# Patient Record
Sex: Female | Born: 1945 | Race: White | Hispanic: No | Smoking: Former smoker
Health system: Southern US, Community
[De-identification: ages and names within clinical notes are randomized; demographics above are authoritative.]

## PROBLEM LIST (undated history)

## (undated) DIAGNOSIS — C4490 Unspecified malignant neoplasm of skin, unspecified: Secondary | ICD-10-CM

## (undated) DIAGNOSIS — R42 Dizziness and giddiness: Secondary | ICD-10-CM

## (undated) DIAGNOSIS — M419 Scoliosis, unspecified: Secondary | ICD-10-CM

## (undated) DIAGNOSIS — K219 Gastro-esophageal reflux disease without esophagitis: Secondary | ICD-10-CM

## (undated) DIAGNOSIS — I509 Heart failure, unspecified: Secondary | ICD-10-CM

## (undated) DIAGNOSIS — E039 Hypothyroidism, unspecified: Secondary | ICD-10-CM

## (undated) DIAGNOSIS — J45909 Unspecified asthma, uncomplicated: Secondary | ICD-10-CM

## (undated) DIAGNOSIS — I10 Essential (primary) hypertension: Secondary | ICD-10-CM

## (undated) DIAGNOSIS — H409 Unspecified glaucoma: Secondary | ICD-10-CM

## (undated) DIAGNOSIS — J302 Other seasonal allergic rhinitis: Secondary | ICD-10-CM

## (undated) DIAGNOSIS — I2510 Atherosclerotic heart disease of native coronary artery without angina pectoris: Secondary | ICD-10-CM

## (undated) HISTORY — PX: ORIF TIBIA & FIBULA FRACTURES: SHX2131

## (undated) HISTORY — PX: TONSILLECTOMY: SUR1361

## (undated) HISTORY — DX: Atherosclerotic heart disease of native coronary artery without angina pectoris: I25.10

## (undated) HISTORY — DX: Heart failure, unspecified: I50.9

## (undated) HISTORY — PX: COLONOSCOPY: SHX174

---

## 2009-06-17 ENCOUNTER — Ambulatory Visit: Payer: Self-pay | Admitting: Internal Medicine

## 2009-06-17 DIAGNOSIS — J45909 Unspecified asthma, uncomplicated: Secondary | ICD-10-CM | POA: Insufficient documentation

## 2009-06-17 DIAGNOSIS — M4120 Other idiopathic scoliosis, site unspecified: Secondary | ICD-10-CM | POA: Insufficient documentation

## 2009-06-17 DIAGNOSIS — R059 Cough, unspecified: Secondary | ICD-10-CM | POA: Insufficient documentation

## 2009-06-17 DIAGNOSIS — R05 Cough: Secondary | ICD-10-CM

## 2010-05-31 NOTE — Assessment & Plan Note (Signed)
Summary: Pulmonary consultation/ upper airway cough syndrome   Visit Type:  Initial Consult Copy to:  Dr. Gertie Baron  Primary Provider/Referring Provider:  Dr. Jeanie Cooks  CC:  Asthma.  History of Present Illness: 65 yowf quit smoking quit 1997 with intermittent symptoms of sinus and bronchitis but not needing any kind of maintenance except allergy shots quit in 1990's and they didn't really help much.   June 17, 2009 cc "never stop drainage" since onset of cold fall of 2010 and ENT eval neg with no cough but sensation of wheeziness in chest but treated 3 weeks ago sulfa drug x one week helped some then prednsione x one week then augmentin x  finished 10 days on  2/16  but  2/14  started coughing up yellow green in am says biaxin works the best.    Pt denies any significant sore throat, dysphagia, itching, sneezing,   fever, chills, sweats, unintended wt loss, pleuritic or exertional cp, hempoptysis, change in activity tolerance  orthopnea pnd or leg swelling. Pt also denies any obvious fluctuation in symptoms with weather or environmental change or other alleviating or aggravating factors.       Current Medications (verified): 1)  Levothroid 75 Mcg Tabs (Levothyroxine Sodium) .Marland Kitchen.. 1 Once Daily 2)  Triamterene-Hctz 37.5-25 Mg Tabs (Triamterene-Hctz) .Marland Kitchen.. 1 Once Daily 3)  Singulair 10 Mg Tabs (Montelukast Sodium) .Marland Kitchen.. 1 Once Daily 4)  Actonel 150 Mg Tabs (Risedronate Sodium) .Marland Kitchen.. 1 Every Monday 5)  Claritin 10 Mg Tabs (Loratadine) .Marland Kitchen.. 1 Once Daily As Needed 6)  Tavist Allergy 1.34 Mg Tabs (Clemastine Fumarate) .Marland Kitchen.. 1 Once Daily As Needed If Not Taking Clariton 7)  Coenzyme Q10 100 Mg Caps (Coenzyme Q10) .Marland Kitchen.. 1 Once Daily 8)  Simvastatin 10 Mg Tabs (Simvastatin) .Marland Kitchen.. 1 Once Daily 9)  Vitamin B-12 100 Mcg Tabs (Cyanocobalamin) .Marland Kitchen.. 1 Once Daily  Allergies (verified): 1)  ! Tetracycline  Past History:  Past Medical History: SCOLIOSIS (ICD-737.30) ASTHMA (ICD-493.90)  Past  Surgical History: Tonsillectomy 1966  Family History: Asthma- maternal aunt  Social History: Married Former smoker.  Quit in 1997.  Smoked for approx 30 yrs up to 1/2 ppd ETOH occ Retired Lives in [place] Oct - April every year [address] but not 2010  Review of Systems       The patient complains of shortness of breath with activity, shortness of breath at rest, productive cough, indigestion, anxiety, and change in color of mucus.  The patient denies non-productive cough, coughing up blood, chest pain, irregular heartbeats, acid heartburn, loss of appetite, weight change, abdominal pain, difficulty swallowing, sore throat, tooth/dental problems, headaches, nasal congestion/difficulty breathing through nose, sneezing, itching, ear ache, depression, hand/feet swelling, joint stiffness or pain, rash, and fever.    Vital Signs:  Patient profile:   65 year old female Height:      63 inches Weight:      182.38 pounds BMI:     32.42 O2 Sat:      96 % on Room air Temp:     97.6 degrees F oral Pulse rate:   77 / minute BP sitting:   138 / 78  (left arm)  Vitals Entered By: Vernie Murders (June 17, 2009 10:20 AM)  O2 Flow:  Room air  Physical Exam  Additional Exam:  amb mod anxious wf who failed to answer a single question asked in a straightforward manner, tending to go off on tangents or answer questions with ambiguous medical terms or diagnoses  wt 182  June 17, 2009  HEENT: nl dentition, turbinates, and orophanx. Nl external ear canals without cough reflex NECK :  without JVD/Nodes/TM/ nl carotid upstrokes bilaterally LUNGS: no acc muscle use, clear to A and P bilaterally without cough on insp or exp maneuvers CV:  RRR  no s3 or murmur or increase in P2, no edema  ABD:  soft and nontender with nl excursion in the supine position. No bruits or organomegaly, bowel sounds nl MS:  warm without deformities, calf tenderness, cyanosis or clubbing SKIN: warm and dry without lesions    NEURO:  alert, approp, no deficits     CXR  Procedure date:  06/17/2009  Findings:        Findings: There is a severe right convex thoracic scoliotic curvature with associated tortuosity of the thoracic aorta.  The heart is normal in size.  No acute pulmonary findings.  Minimal basilar scarring or atelectasis.   IMPRESSION:   1.  Streaky bibasilar scarring or atelectasis. 2.  No infiltrates, edema or effusions  Impression & Recommendations:  Problem # 1:  COUGH (ICD-786.2) The most common causes of chronic cough in immunocompetent adults include: upper airway cough syndrome (UACS), previously referred to as postnasal drip syndrome,  caused by variety of rhinosinus conditions; (2) asthma; (3) GERD; (4) chronic bronchitis from cigarette smoking or other inhaled environmental irritants; (5) nonasthmatic eosinophilic bronchitis; and (6) bronchiectasis. These conditions, singly or in combination, have accounted for up to 94% of the causes of chronic cough in prospective studies.  Most likely this is Upper airway cough syndrome, so named because it's frequently impossible to sort out how much is LPR vs CR/sinusitis with freq throat clearing generating secondary extra esophageal GERD from wide swings in gastric pressure that occur with throat clearing, promoting self use of mint and menthol lozenges that reduce the lower esophageal sphincter tone and exacerbate the problem further.  These symptoms are easily confused with asthma/copd by even experienced pulmonogists because they overlap so much. These are the same pts who not infrequently have failed to tolerate ace inhibitors,  dry powder inhalers or biphosphonates or report having reflux symptoms that don't respond to standard doses of PPI  The standardized cough guidelines recently published in Chest are a 14 step process, not a single office visit,  and are intended  to address this problem logically,  with an alogrithm dependent on  response to each progressive step  to determine a specific diagnosis with  minimal addtional testing needed. Therefore if compliance is an issue this empiric standardized approach simply won't work.   See instructions for specific recommendations   Problem # 2:  ASTHMA (ICD-493.90) Strongly doubt this is asthma though remains in DDX and is easily confused with the upper airway cough syndrome.  Would consider methacholine challenge test later after first address the upper airway aggressively as above.  Medications Added to Medication List This Visit: 1)  Levothroid 75 Mcg Tabs (Levothyroxine sodium) .Marland Kitchen.. 1 once daily 2)  Triamterene-hctz 37.5-25 Mg Tabs (Triamterene-hctz) .Marland Kitchen.. 1 once daily 3)  Singulair 10 Mg Tabs (Montelukast sodium) .Marland Kitchen.. 1 once daily 4)  Actonel 150 Mg Tabs (Risedronate sodium) .Marland Kitchen.. 1 every monday 5)  Claritin 10 Mg Tabs (Loratadine) .Marland Kitchen.. 1 once daily as needed 6)  Tavist Allergy 1.34 Mg Tabs (Clemastine fumarate) .Marland Kitchen.. 1 once daily as needed if not taking clariton 7)  Coenzyme Q10 100 Mg Caps (Coenzyme q10) .Marland Kitchen.. 1 once daily 8)  Simvastatin 10 Mg Tabs (Simvastatin) .Marland Kitchen.. 1 once  daily 9)  Vitamin B-12 100 Mcg Tabs (Cyanocobalamin) .Marland Kitchen.. 1 once daily 10)  Biaxin Xl 500 Mg Tb24 (Clarithromycin) .Marland Kitchen.. 1000 mg (2 tablets) daily x 10days  Other Orders: T-2 View CXR ([id number]) Consultation Level V (78295)  Patient Instructions: 1)  Acid reflux is a leading suspect here and needs to be eliminated  completely before considering additional studies or treatment options. To suppress this maximally, take prilosec 20 mg 30 min  before  and pepcid 20 mg (otc) at bedtime plus diet measures as listed until 100% better 2)  GERD (REFLUX)  is a common cause of respiratory symptoms. It commonly presents without heartburn and can be treated with medication, but also with lifestyle changes including avoidance of late meals, excessive alcohol, smoking cessation, and avoid fatty foods, chocolate,  peppermint, colas, red wine, and acidic juices such as orange juice. NO MINT OR MENTHOL PRODUCTS SO NO COUGH DROPS  3)  USE SUGARLESS CANDY INSTEAD (jolley ranchers)  4)  NO OIL BASED VITAMINS  5)  NO ACTONEL until 100% better  6)  Biaxin 500 take 2 daily x 10 days  7)  Pulmonary f/u can be as needed if not 100% better. Prescriptions: BIAXIN XL 500 MG  TB24 (CLARITHROMYCIN) 1000 mg (2 tablets) daily x 10days Brand medically necessary #20 x 0   Entered and Authorized by:   Nyoka Cowden MD   Signed by:   Nyoka Cowden MD on 06/17/2009   Method used:   Electronically to        [facility] [phone]* (retail)       [address]       [place], [place]  [address]       Ph: [phone]       Fax:    RxID:   [id number]

## 2012-09-20 ENCOUNTER — Ambulatory Visit: Payer: Self-pay | Admitting: Family Medicine

## 2013-09-14 ENCOUNTER — Ambulatory Visit: Payer: Self-pay | Admitting: Physician Assistant

## 2013-09-23 ENCOUNTER — Ambulatory Visit: Payer: Self-pay | Admitting: Family Medicine

## 2014-07-16 IMAGING — CR DG KNEE COMPLETE 4+V*L*
1 series · 5 of 5 positions shown · non-contrast
Comparison: None.

CLINICAL DATA: Fall

EXAM:
LEFT KNEE - COMPLETE 4+ VIEW

[Series 1: ap · 0.17mm/px · 5 of 5 slices shown]
[im 1/5]
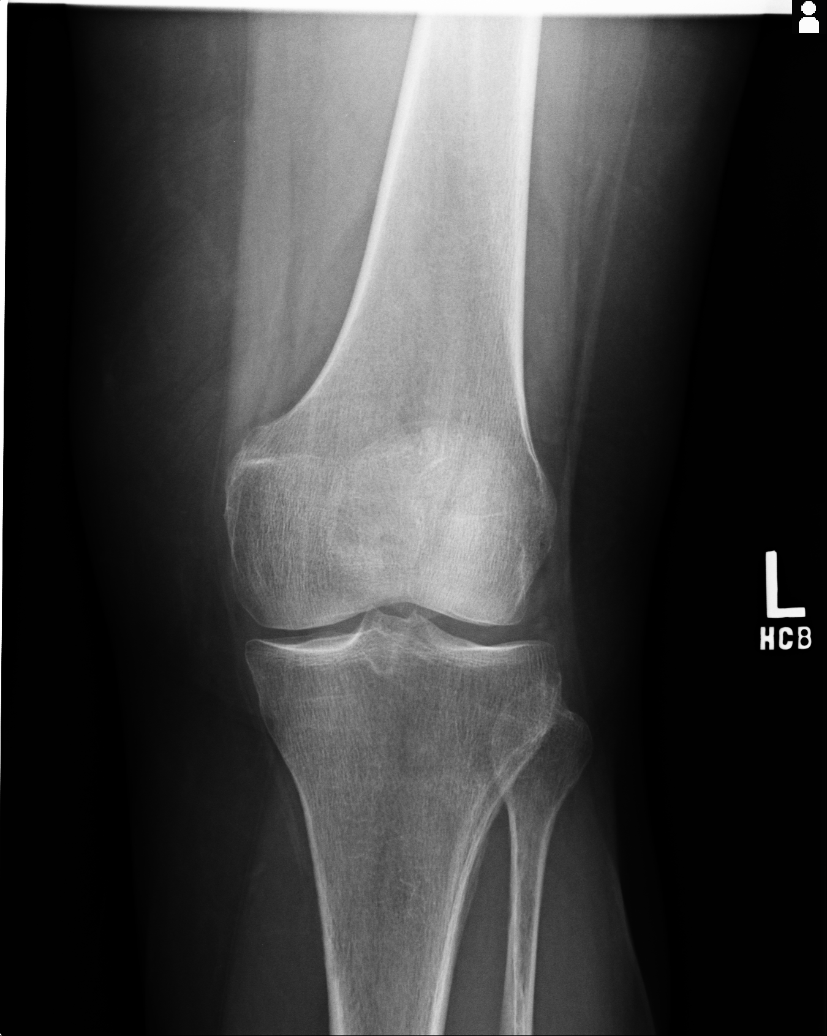
[im 2/5]
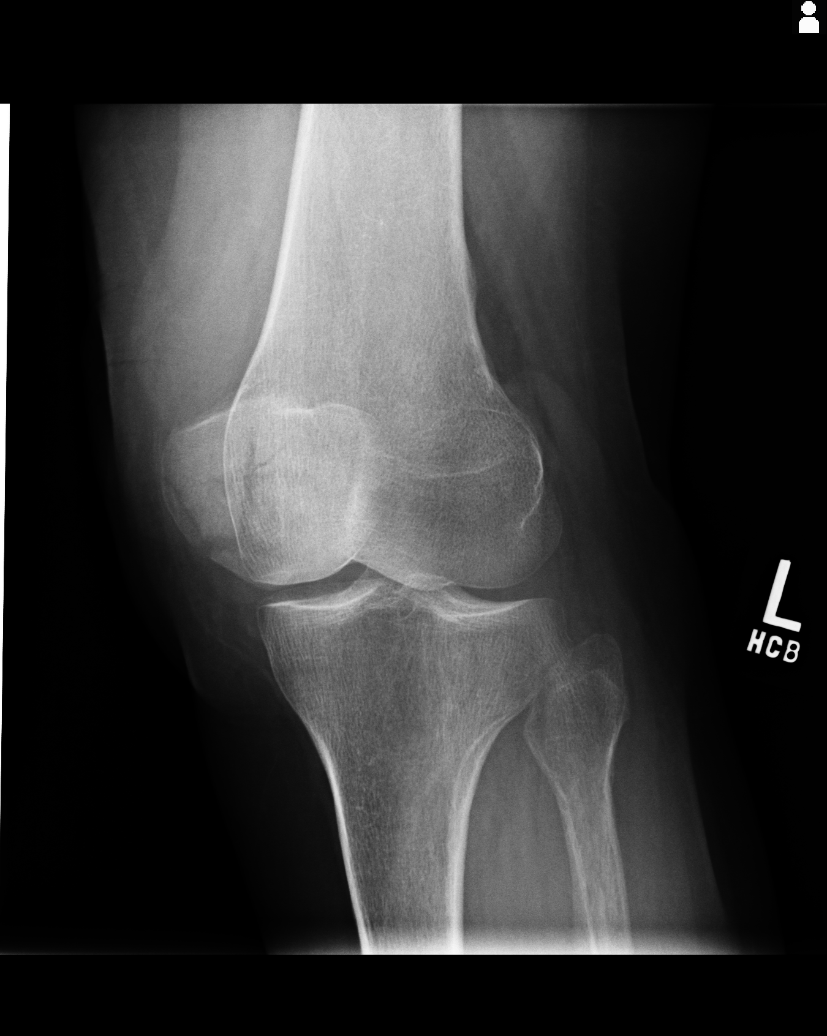
[im 3/5]
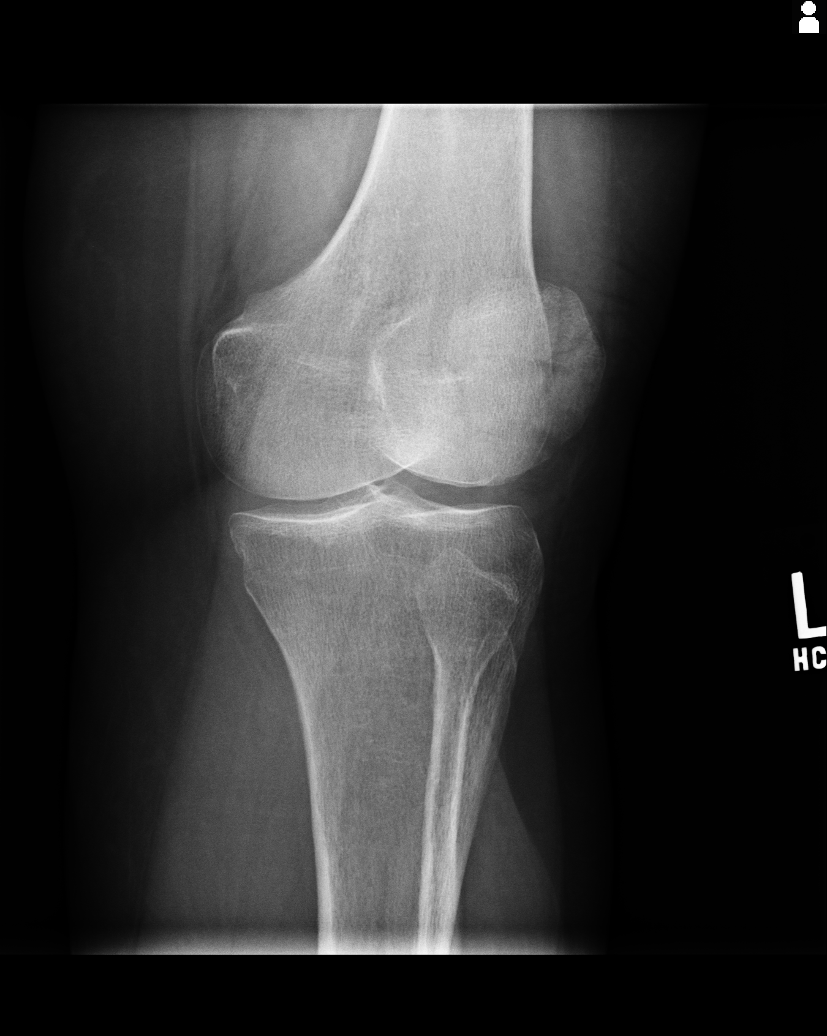
[im 4/5]
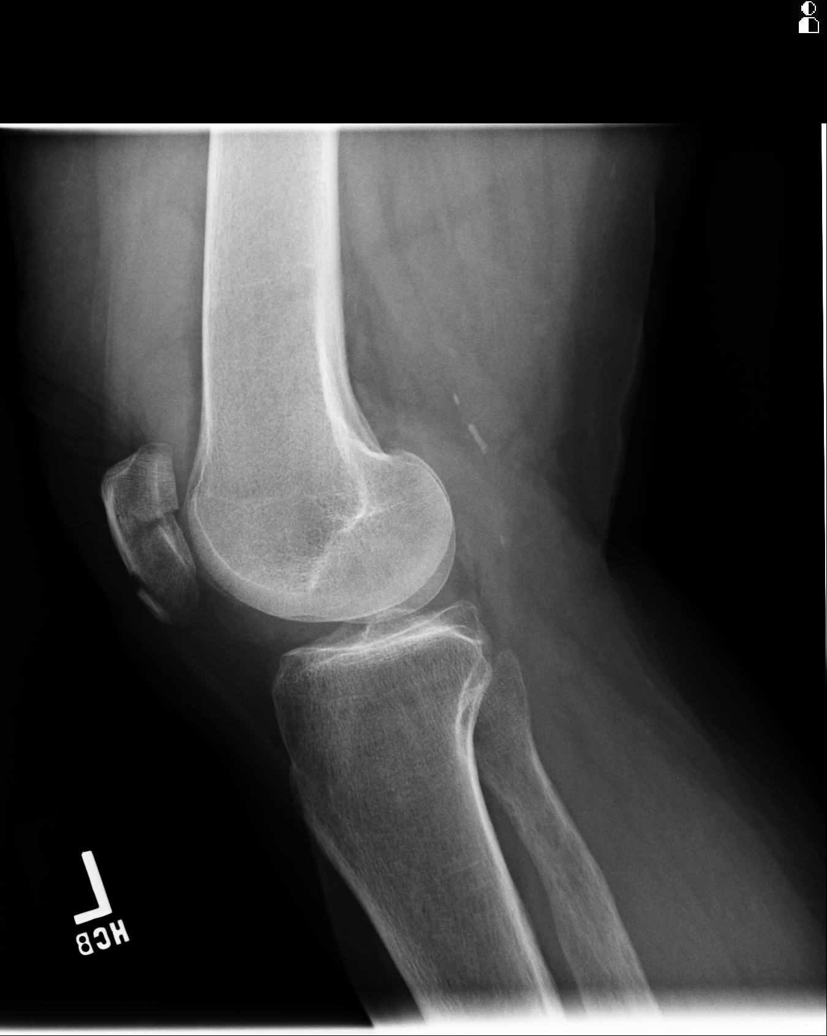
[im 5/5]
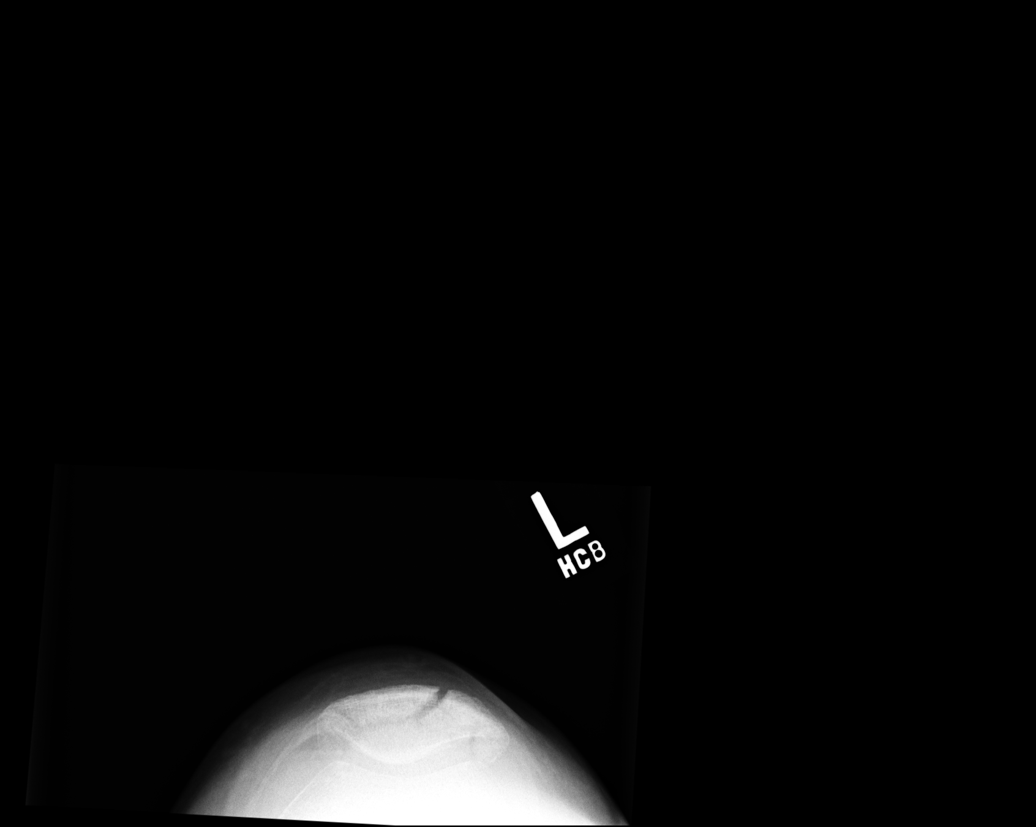

[5 of 5 positions shown; findings below may reference images not displayed]

FINDINGS: Mild narrowing of the medial compartment. Mild osteopenia. There is
a comminuted fracture of the patella. Moderate joint effusion is
present likely a hemarthrosis. Vascular calcifications.
IMPRESSION: Comminuted fracture of the patella associated with a hemarthrosis.

## 2014-08-06 ENCOUNTER — Ambulatory Visit
Admit: 2014-08-06 | Disposition: A | Discharge status: Home / Self Care | Payer: Self-pay | Attending: Family Medicine | Admitting: Family Medicine

## 2015-09-29 IMAGING — US US CAROTID DUPLEX BILAT
1 series · 13 of 24 positions shown · non-contrast
Comparison: None.

CLINICAL DATA: Calcifications seen on dental radiographs, history
of hypertension, hyperlipidemia

EXAM:
BILATERAL CAROTID DUPLEX ULTRASOUND
TECHNIQUE: Gray scale imaging, color Doppler and duplex ultrasound were
performed of bilateral carotid and vertebral arteries in the neck.

[Series 1: us carotid duplex bilat · 0.05mm/px · 13 of 63 slices shown]
[im 1/63]
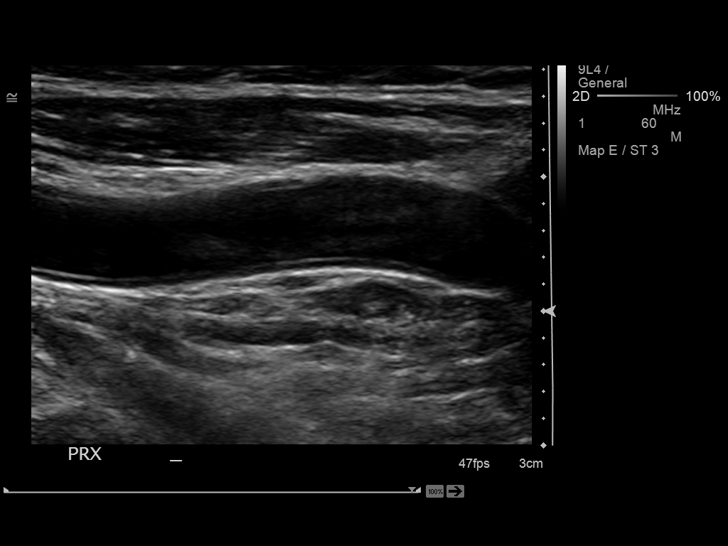
[im 6/63]
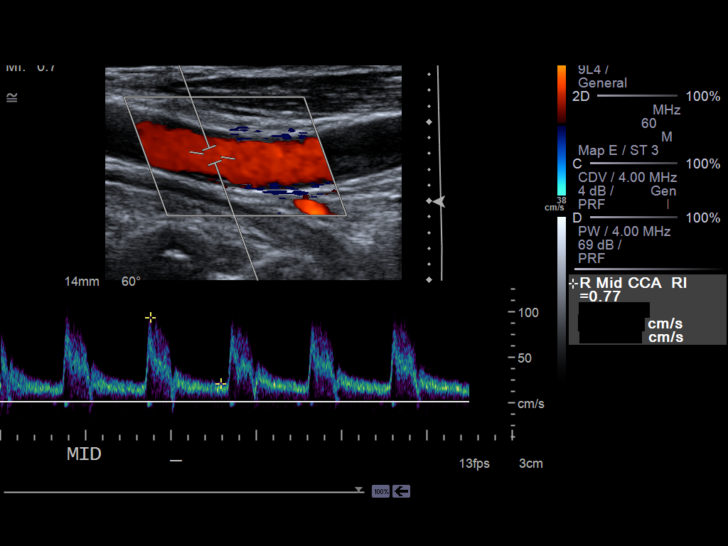
[im 11/63]
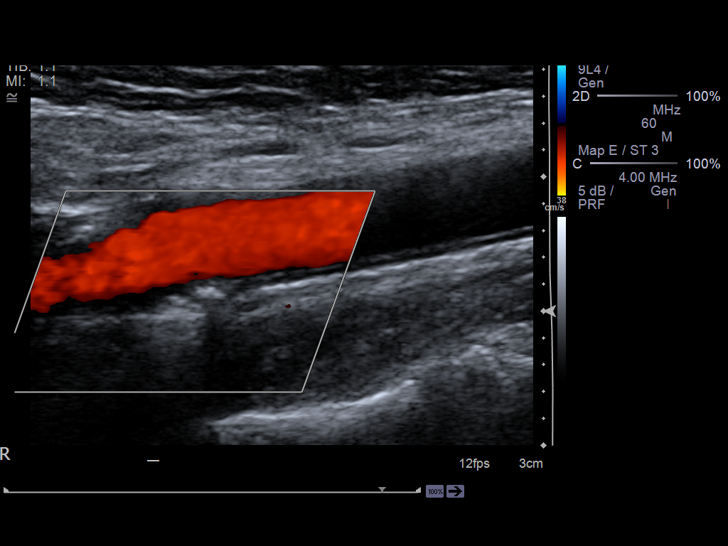
[im 17/63]
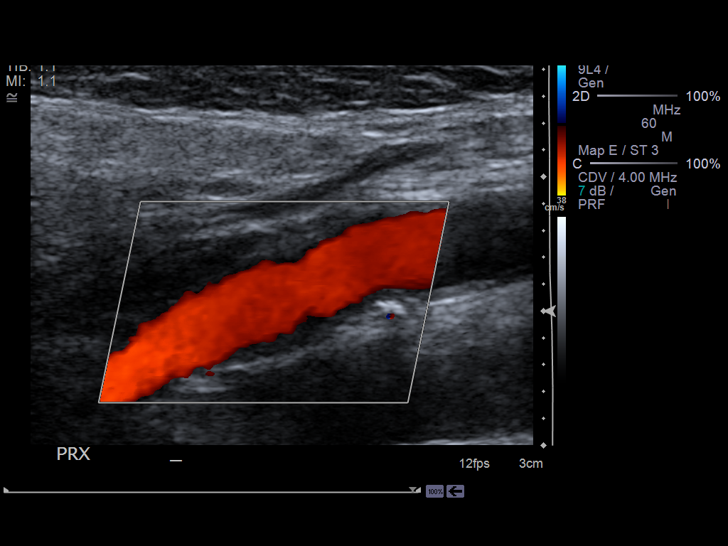
[im 22/63]
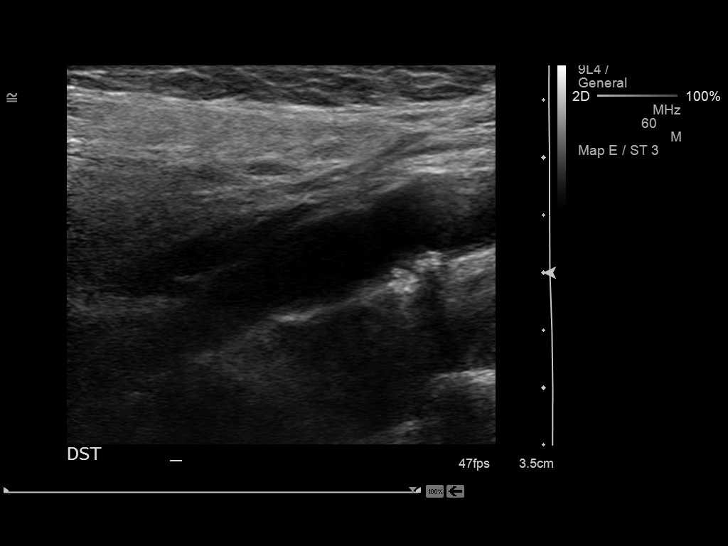
[im 27/63]
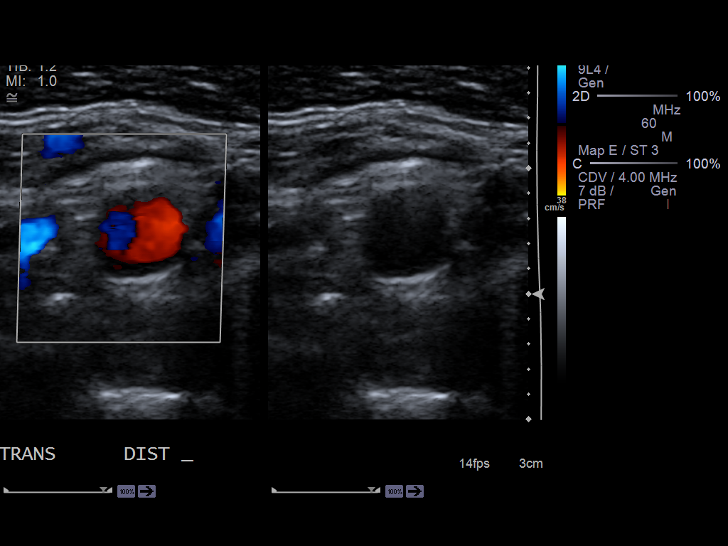
[im 33/63]
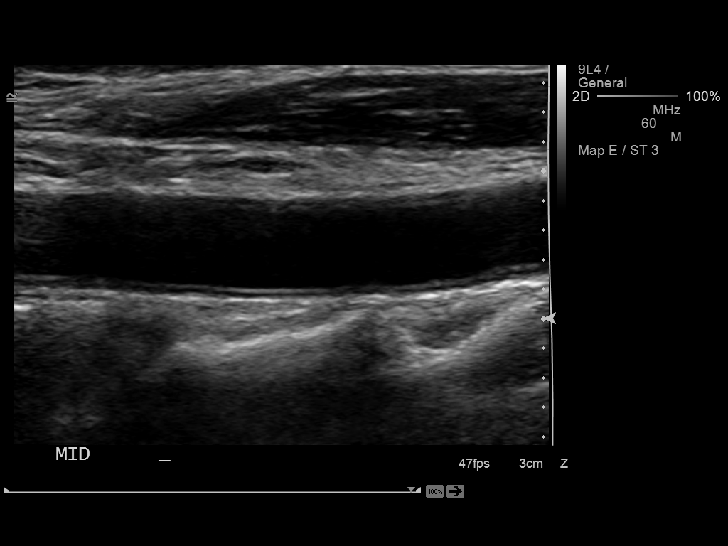
[im 36/63]
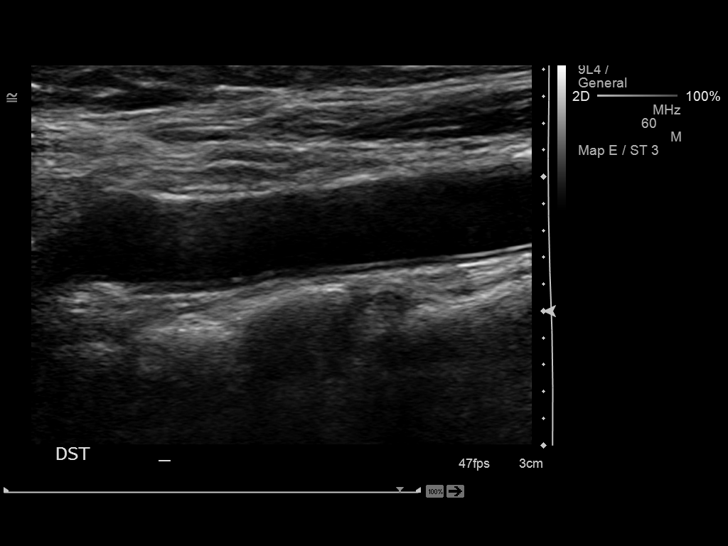
[im 41/63]
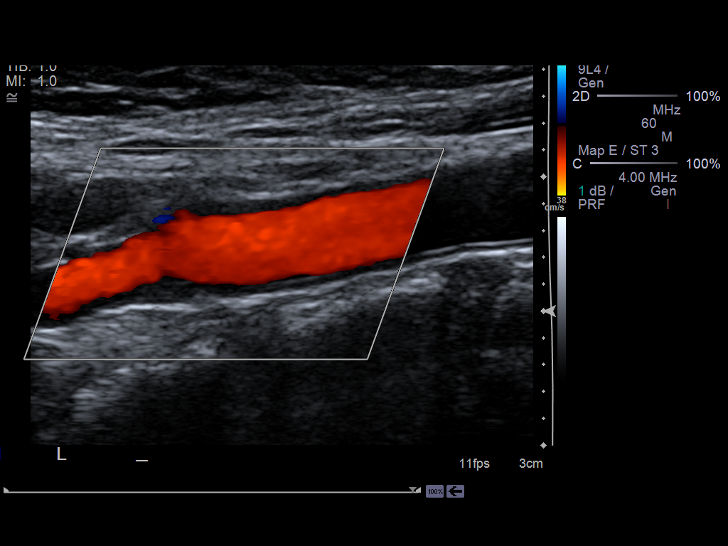
[im 46/63]
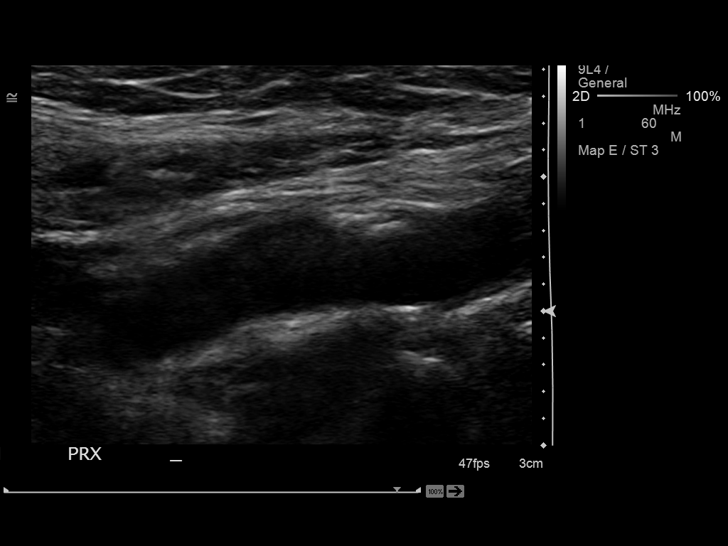
[im 52/63]
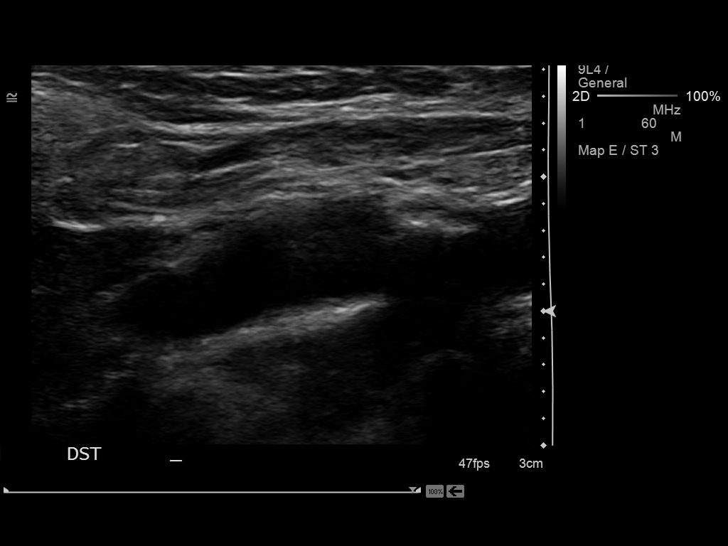
[im 57/63]
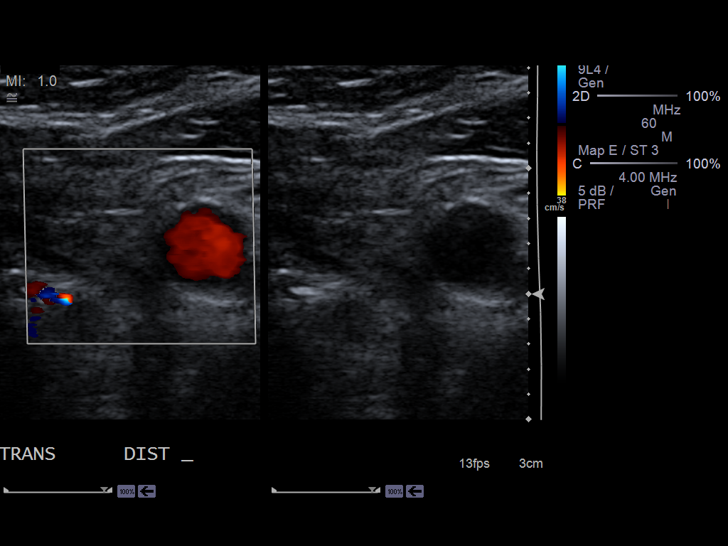
[im 63/63]
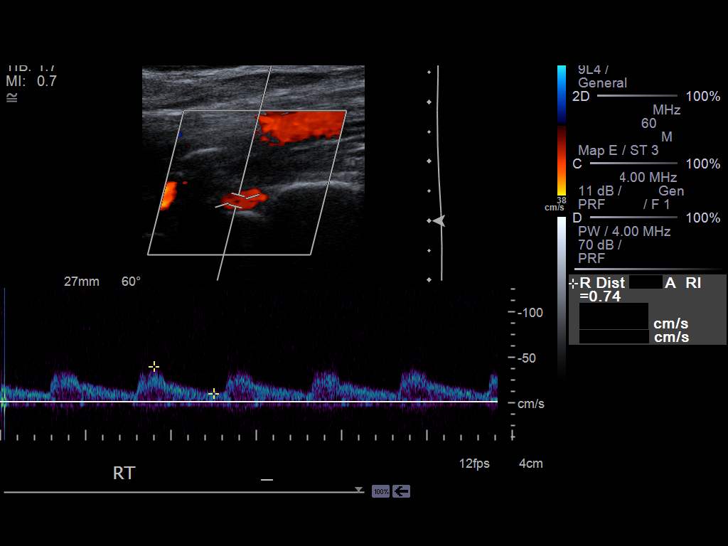

[13 of 24 positions shown; findings below may reference images not displayed]

FINDINGS: Criteria: Quantification of carotid stenosis is based on velocity
parameters that correlate the residual internal carotid diameter
with NASCET-based stenosis levels, using the diameter of the distal
internal carotid lumen as the denominator for stenosis measurement.

The following velocity measurements were obtained:

RIGHT

ICA:  82/34.4 cm/sec

CCA:  93.7/21.1 cm/sec

SYSTOLIC ICA/CCA RATIO:

DIASTOLIC ICA/CCA RATIO:

ECA:  103.1 cm/sec

LEFT

ICA:  76.5/25 cm/sec

CCA:  80.8/20.2 cm/sec

SYSTOLIC ICA/CCA RATIO:

DIASTOLIC ICA/CCA RATIO:

ECA:  92.2 cm/sec

RIGHT CAROTID ARTERY: There is a minimal amount of eccentric mixed
echogenic plaque within the right carotid bulb (image 11) extending
to involve the origin proximal aspect of the right internal carotid
artery (image 17), not resulting in elevated peak systolic
velocities within the interrogated course of the right internal
carotid artery to suggest a hemodynamically significant stenosis.

RIGHT VERTEBRAL ARTERY:  Antegrade flow

LEFT CAROTID ARTERY: There is a minimal amount of eccentric
hypoechoic plaque within the left carotid bulb (image 41) extending
to involve the origin proximal aspect of the left internal carotid
artery (image 47), not resulting in elevated peak systolic
velocities within the interrogated course of the left internal
carotid artery to suggest a hemodynamically significant stenosis.

LEFT VERTEBRAL ARTERY:  Antegrade flow
IMPRESSION: Minimal amount of bilateral atherosclerotic plaque, right
subjectively greater than left, not resulting in a hemodynamically
significant stenosis.

## 2016-10-09 ENCOUNTER — Ambulatory Visit: Admit: 2016-10-09 | Payer: Self-pay | Admitting: Unknown Physician Specialty

## 2016-10-09 SURGERY — COLONOSCOPY WITH PROPOFOL
Anesthesia: General

## 2018-02-26 ENCOUNTER — Encounter: Payer: Self-pay | Admitting: Family Medicine

## 2018-02-27 ENCOUNTER — Other Ambulatory Visit: Payer: Self-pay

## 2018-02-27 DIAGNOSIS — Z86010 Personal history of colonic polyps: Secondary | ICD-10-CM

## 2018-03-06 ENCOUNTER — Encounter: Payer: Self-pay | Admitting: *Deleted

## 2018-03-06 ENCOUNTER — Other Ambulatory Visit: Payer: Self-pay

## 2018-03-06 MED ORDER — NA SULFATE-K SULFATE-MG SULF 17.5-3.13-1.6 GM/177ML PO SOLN: 1.0000 | Freq: Once | ORAL | 0 refills | Status: AC

## 2018-03-12 ENCOUNTER — Ambulatory Visit: Payer: Medicare Other | Admitting: Anesthesiology

## 2018-03-12 ENCOUNTER — Ambulatory Visit
Admission: RE | Admit: 2018-03-12 | Discharge: 2018-03-12 | Disposition: A | Discharge status: Home / Self Care | Payer: Medicare Other | Source: Ambulatory Visit | Attending: Gastroenterology | Admitting: Gastroenterology

## 2018-03-12 ENCOUNTER — Encounter
Admission: RE | Disposition: A | Discharge status: Home / Self Care | Payer: Self-pay | Source: Ambulatory Visit | Attending: Gastroenterology

## 2018-03-12 DIAGNOSIS — I10 Essential (primary) hypertension: Secondary | ICD-10-CM | POA: Insufficient documentation

## 2018-03-12 DIAGNOSIS — Z7951 Long term (current) use of inhaled steroids: Secondary | ICD-10-CM | POA: Insufficient documentation

## 2018-03-12 DIAGNOSIS — Z86010 Personal history of colonic polyps: Secondary | ICD-10-CM

## 2018-03-12 DIAGNOSIS — Z79899 Other long term (current) drug therapy: Secondary | ICD-10-CM | POA: Diagnosis not present

## 2018-03-12 DIAGNOSIS — K5730 Diverticulosis of large intestine without perforation or abscess without bleeding: Secondary | ICD-10-CM | POA: Diagnosis not present

## 2018-03-12 DIAGNOSIS — Z1211 Encounter for screening for malignant neoplasm of colon: Secondary | ICD-10-CM | POA: Insufficient documentation

## 2018-03-12 DIAGNOSIS — E039 Hypothyroidism, unspecified: Secondary | ICD-10-CM | POA: Diagnosis not present

## 2018-03-12 DIAGNOSIS — H409 Unspecified glaucoma: Secondary | ICD-10-CM | POA: Insufficient documentation

## 2018-03-12 DIAGNOSIS — K219 Gastro-esophageal reflux disease without esophagitis: Secondary | ICD-10-CM | POA: Diagnosis not present

## 2018-03-12 DIAGNOSIS — K649 Unspecified hemorrhoids: Secondary | ICD-10-CM | POA: Diagnosis not present

## 2018-03-12 DIAGNOSIS — J45909 Unspecified asthma, uncomplicated: Secondary | ICD-10-CM | POA: Insufficient documentation

## 2018-03-12 DIAGNOSIS — M419 Scoliosis, unspecified: Secondary | ICD-10-CM | POA: Diagnosis not present

## 2018-03-12 DIAGNOSIS — Z87891 Personal history of nicotine dependence: Secondary | ICD-10-CM | POA: Insufficient documentation

## 2018-03-12 DIAGNOSIS — Z79890 Hormone replacement therapy (postmenopausal): Secondary | ICD-10-CM | POA: Diagnosis not present

## 2018-03-12 DIAGNOSIS — Z85828 Personal history of other malignant neoplasm of skin: Secondary | ICD-10-CM | POA: Insufficient documentation

## 2018-03-12 HISTORY — DX: Other seasonal allergic rhinitis: J30.2

## 2018-03-12 HISTORY — DX: Unspecified asthma, uncomplicated: J45.909

## 2018-03-12 HISTORY — DX: Unspecified malignant neoplasm of skin, unspecified: C44.90

## 2018-03-12 HISTORY — DX: Gastro-esophageal reflux disease without esophagitis: K21.9

## 2018-03-12 HISTORY — DX: Essential (primary) hypertension: I10

## 2018-03-12 HISTORY — DX: Unspecified glaucoma: H40.9

## 2018-03-12 HISTORY — DX: Dizziness and giddiness: R42

## 2018-03-12 HISTORY — DX: Hypothyroidism, unspecified: E03.9

## 2018-03-12 HISTORY — DX: Scoliosis, unspecified: M41.9

## 2018-03-12 HISTORY — PX: COLONOSCOPY WITH PROPOFOL: SHX5780

## 2018-03-12 SURGERY — COLONOSCOPY WITH PROPOFOL
Anesthesia: Monitor Anesthesia Care | Site: Rectum

## 2018-03-12 MED ORDER — STERILE WATER FOR IRRIGATION IR SOLN
Status: DC | PRN
  Administered 2018-03-12: 10:00:00

## 2018-03-12 MED ORDER — SODIUM CHLORIDE 0.9 % IV SOLN: INTRAVENOUS | Status: DC

## 2018-03-12 MED ORDER — LACTATED RINGERS IV SOLN
INTRAVENOUS | Status: DC | PRN
  Administered 2018-03-12: 09:00:00 via INTRAVENOUS

## 2018-03-12 MED ORDER — LIDOCAINE HCL (CARDIAC) PF 100 MG/5ML IV SOSY
PREFILLED_SYRINGE | INTRAVENOUS | Status: DC | PRN
  Administered 2018-03-12: 30 mg via INTRAVENOUS

## 2018-03-12 MED ORDER — PROPOFOL 10 MG/ML IV BOLUS
INTRAVENOUS | Status: DC | PRN
  Administered 2018-03-12 (×3): 50 mg via INTRAVENOUS
  Administered 2018-03-12: 150 mg via INTRAVENOUS
  Administered 2018-03-12 (×2): 50 mg via INTRAVENOUS

## 2018-03-12 SURGICAL SUPPLY — 5 items
CANISTER SUCT 1200ML W/VALVE (MISCELLANEOUS) ×3 IMPLANT
GOWN CVR UNV OPN BCK APRN NK (MISCELLANEOUS) ×2 IMPLANT
GOWN ISOL THUMB LOOP REG UNIV (MISCELLANEOUS) ×4
KIT ENDO PROCEDURE OLY (KITS) ×3 IMPLANT
WATER STERILE IRR 250ML POUR (IV SOLUTION) ×3 IMPLANT

## 2018-03-12 NOTE — Discharge Instructions (Signed)
General Anesthesia, Adult, Care After °These instructions provide you with information about caring for yourself after your procedure. Your health care provider may also give you more specific instructions. Your treatment has been planned according to current medical practices, but problems sometimes occur. Call your health care provider if you have any problems or questions after your procedure. °What can I expect after the procedure? °After the procedure, it is common to have: °· Vomiting. °· A sore throat. °· Mental slowness. ° °It is common to feel: °· Nauseous. °· Cold or shivery. °· Sleepy. °· Tired. °· Sore or achy, even in parts of your body where you did not have surgery. ° °Follow these instructions at home: °For at least 24 hours after the procedure: °· Do not: °? Participate in activities where you could fall or become injured. °? Drive. °? Use heavy machinery. °? Drink alcohol. °? Take sleeping pills or medicines that cause drowsiness. °? Make important decisions or sign legal documents. °? Take care of children on your own. °· Rest. °Eating and drinking °· If you vomit, drink water, juice, or soup when you can drink without vomiting. °· Drink enough fluid to keep your urine clear or pale yellow. °· Make sure you have little or no nausea before eating solid foods. °· Follow the diet recommended by your health care provider. °General instructions °· Have a responsible adult stay with you until you are awake and alert. °· Return to your normal activities as told by your health care provider. Ask your health care provider what activities are safe for you. °· Take over-the-counter and prescription medicines only as told by your health care provider. °· If you smoke, do not smoke without supervision. °· Keep all follow-up visits as told by your health care provider. This is important. °Contact a health care provider if: °· You continue to have nausea or vomiting at home, and medicines are not helpful. °· You  cannot drink fluids or start eating again. °· You cannot urinate after 8-12 hours. °· You develop a skin rash. °· You have fever. °· You have increasing redness at the site of your procedure. °Get help right away if: °· You have difficulty breathing. °· You have chest pain. °· You have unexpected bleeding. °· You feel that you are having a life-threatening or urgent problem. °This information is not intended to replace advice given to you by your health care provider. Make sure you discuss any questions you have with your health care provider. °Document Released: 07/24/2000 Document Revised: 09/20/2015 Document Reviewed: 04/01/2015 °Elsevier Interactive Patient Education © 2018 [employer] ° °

## 2018-03-12 NOTE — Anesthesia Postprocedure Evaluation (Signed)
Anesthesia Post Note  Patient: Lisa Hale  Procedure(s) Performed: COLONOSCOPY WITH PROPOFOL (N/A Rectum)  Patient location during evaluation: PACU Anesthesia Type: MAC Level of consciousness: awake Pain management: pain level controlled Vital Signs Assessment: post-procedure vital signs reviewed and stable Respiratory status: spontaneous breathing Cardiovascular status: blood pressure returned to baseline Postop Assessment: no headache Anesthetic complications: no    Lavonna Monarch

## 2018-03-12 NOTE — Transfer of Care (Signed)
Immediate Anesthesia Transfer of Care Note  Patient: Lisa Hale  Procedure(s) Performed: COLONOSCOPY WITH PROPOFOL (N/A Rectum)  Patient Location: PACU  Anesthesia Type: MAC, General  Level of Consciousness: awake, alert  and patient cooperative  Airway and Oxygen Therapy: Patient Spontanous Breathing and Patient connected to supplemental oxygen  Post-op Assessment: Post-op Vital signs reviewed, Patient's Cardiovascular Status Stable, Respiratory Function Stable, Patent Airway and No signs of Nausea or vomiting  Post-op Vital Signs: Reviewed and stable  Complications: No apparent anesthesia complications

## 2018-03-12 NOTE — Anesthesia Preprocedure Evaluation (Addendum)
Anesthesia Evaluation  Patient identified by MRN, date of birth, ID band Patient awake    Reviewed: Allergy & Precautions, NPO status , Patient's Chart, lab work & pertinent test results, reviewed documented beta blocker date and time   Airway Mallampati: II  TM Distance: >3 FB Neck ROM: Full    Dental no notable dental hx.    Pulmonary asthma , former smoker,    Pulmonary exam normal breath sounds clear to auscultation       Cardiovascular hypertension, Normal cardiovascular exam Rhythm:Regular Rate:Normal     Neuro/Psych negative neurological ROS  negative psych ROS   GI/Hepatic GERD  ,  Endo/Other  Hypothyroidism   Renal/GU      Musculoskeletal negative musculoskeletal ROS (+)   Abdominal (+) + obese,   Peds  Hematology negative hematology ROS (+)   Anesthesia Other Findings   Reproductive/Obstetrics                            Anesthesia Physical Anesthesia Plan  ASA: II  Anesthesia Plan: MAC and General   Post-op Pain Management:    Induction: Intravenous  PONV Risk Score and Plan:   Airway Management Planned: Natural Airway  Additional Equipment: None  Intra-op Plan:   Post-operative Plan:   Informed Consent: I have reviewed the patients History and Physical, chart, labs and discussed the procedure including the risks, benefits and alternatives for the proposed anesthesia with the patient or authorized representative who has indicated his/her understanding and acceptance.     Plan Discussed with: CRNA, Anesthesiologist and Surgeon  Anesthesia Plan Comments:         Anesthesia Quick Evaluation

## 2018-03-12 NOTE — H&P (Signed)
Vonda Antigua, MD [address] [place], [place], [place], [address] [address], [place], [place], [place], [address] Phone: [phone]  Fax: [phone]  Primary Care Physician:  Lynnell Jude, MD   Pre-Procedure History & Physical: HPI:  Lisa Hale is a 72 y.o. female is here for a colonoscopy.   Past Medical History:  Diagnosis Date  . Asthma   . GERD (gastroesophageal reflux disease)   . Glaucoma   . Hypertension   . Hypothyroidism   . Scoliosis   . Seasonal allergies   . Skin cancer   . Vertigo    Avg: 1x/mo    Past Surgical History:  Procedure Laterality Date  . COLONOSCOPY    . ORIF TIBIA & FIBULA FRACTURES    . TONSILLECTOMY      Prior to Admission medications   Medication Sig Start Date End Date Taking? Authorizing Provider  albuterol (PROVENTIL HFA;VENTOLIN HFA) 108 (90 Base) MCG/ACT inhaler Inhale into the lungs every 6 (six) hours as needed for wheezing or shortness of breath.   Yes [provider]  cholecalciferol 25 MCG (1000 UT) TABS Take by mouth daily.   Yes [provider]  fluticasone (FLOVENT HFA) 220 MCG/ACT inhaler Inhale into the lungs 2 (two) times daily as needed.   Yes [provider]  lansoprazole (PREVACID) 15 MG capsule Take 15 mg by mouth daily at 12 noon.   Yes [provider]  levothyroxine (SYNTHROID, LEVOTHROID) 200 MCG tablet Take 200 mcg by mouth daily before breakfast.   Yes [provider]  montelukast (SINGULAIR) 10 MG tablet Take 10 mg by mouth at bedtime.   Yes [provider]  Tafluprost (ZIOPTAN OP) Place 1 drop into both eyes at bedtime.   Yes [provider]  triamterene-hydrochlorothiazide (MAXZIDE-25) 37.5-25 MG tablet Take 1 tablet by mouth daily.   Yes [provider]    Allergies as of 02/27/2018 - Reviewed 06/17/2009  Allergen Reaction Noted  . Tetracycline      History reviewed. No pertinent family history.  Social  History   Socioeconomic History  . Marital status: Widowed    Spouse name: Not on file  . Number of children: Not on file  . Years of education: Not on file  . Highest education level: Not on file  Occupational History  . Not on file  Social Needs  . Financial resource strain: Not on file  . Food insecurity:    Worry: Not on file    Inability: Not on file  . Transportation needs:    Medical: Not on file    Non-medical: Not on file  Tobacco Use  . Smoking status: Former Smoker    Last attempt to quit: 1999    Years since quitting: 20.8  . Smokeless tobacco: Never Used  . Tobacco comment: has had coccasional cig since quitting  Substance and Sexual Activity  . Alcohol use: Yes    Comment: couple x/yr  . Drug use: Not on file  . Sexual activity: Not on file  Lifestyle  . Physical activity:    Days per week: Not on file    Minutes per session: Not on file  . Stress: Not on file  Relationships  . Social connections:    Talks on phone: Not on file    Gets together: Not on file    Attends religious service: Not on file    Active member of club or organization: Not on file    Attends  meetings of clubs or organizations: Not on file    Relationship status: Not on file  . Intimate partner violence:    Fear of current or ex partner: Not on file    Emotionally abused: Not on file    Physically abused: Not on file    Forced sexual activity: Not on file  Other Topics Concern  . Not on file  Social History Narrative  . Not on file    Review of Systems: See HPI, otherwise negative ROS  Physical Exam: BP (!) 150/71   Pulse 77   Temp 97.7 F (36.5 C)   Ht 5\' 3"  (1.6 m)   Wt 81.2 kg   SpO2 97%   BMI 31.71 kg/m  General:   Alert,  pleasant and cooperative in NAD Head:  Normocephalic and atraumatic. Neck:  Supple; no masses or thyromegaly. Lungs:  Clear throughout to auscultation, normal respiratory effort.    Heart:  +S1, +S2, Regular rate and rhythm, No  edema. Abdomen:  Soft, nontender and nondistended. Normal bowel sounds, without guarding, and without rebound.   Neurologic:  Alert and  oriented x4;  grossly normal neurologically.  Impression/Plan: Lisa Hale is here for a colonoscopy to be performed for polyp surveillance  Risks, benefits, limitations, and alternatives regarding  colonoscopy have been reviewed with the patient.  Questions have been answered.  All parties agreeable.   Virgel Manifold, MD  03/12/2018, 9:00 AM

## 2018-03-12 NOTE — Op Note (Signed)
[facility] Patient Name: Lisa Hale Procedure Date: 03/12/2018 9:02 AM MRN: [id number] Account #: [id number] Date of Birth: 25-Apr-1946 Admit Type: Outpatient Age: 72 Room: [facility] OR ROOM 01 Gender: Female Note Status: Finalized Procedure:            Colonoscopy Indications:          High risk colon cancer surveillance: Personal history                        of colonic polyps Providers:            Mavrik Bynum B. Bonna Gains MD, MD Referring MD:         Lynnell Jude (Referring MD) Medicines:            Monitored Anesthesia Care Complications:        No immediate complications. Procedure:            Pre-Anesthesia Assessment:                       - Prior to the procedure, a History and Physical was                        performed, and patient medications, allergies and                        sensitivities were reviewed. The patient's tolerance of                        previous anesthesia was reviewed.                       - The risks and benefits of the procedure and the                        sedation options and risks were discussed with the                        patient. All questions were answered and informed                        consent was obtained.                       - Patient identification and proposed procedure were                        verified prior to the procedure by the physician, the                        nurse, the anesthetist and the technician. The                        procedure was verified in the pre-procedure area in the                        procedure room in the endoscopy suite.                       - ASA Grade Assessment: II - A patient with mild  systemic disease.                       - After reviewing the risks and benefits, the patient                        was deemed in satisfactory condition to undergo the                        procedure.                       After obtaining  informed consent, the colonoscope was                        passed under direct vision. Throughout the procedure,                        the patient's blood pressure, pulse, and oxygen                        saturations were monitored continuously. The was                        introduced through the anus and advanced to the the                        terminal ileum. The colonoscopy was performed with                        ease. The patient tolerated the procedure well. The                        quality of the bowel preparation was good. Findings:      Hemorrhoids were found on perianal exam.      Multiple diverticula were found in the sigmoid colon.      The exam was otherwise without abnormality.      The rectum, sigmoid colon, descending colon, transverse colon, ascending       colon, cecum and ileum appeared normal.      The retroflexed view of the distal rectum and anal verge was normal and       showed no anal or rectal abnormalities. Impression:           - Hemorrhoids found on perianal exam.                       - Diverticulosis in the sigmoid colon.                       - The examination was otherwise normal.                       - The rectum, sigmoid colon, descending colon,                        transverse colon, ascending colon, cecum and terminal                        ileum are normal.                       -  The distal rectum and anal verge are normal on                        retroflexion view.                       - No specimens collected. Recommendation:       - Discharge patient to home.                       - Resume previous diet.                       - Continue present medications.                       - Repeat colonoscopy in 5 years for surveillance. (Due                        to previous history of adenoma polyps reported by PCP                        referral and by patient).                       - Return to primary care physician as previously                         scheduled.                       - The findings and recommendations were discussed with                        the patient.                       - The findings and recommendations were discussed with                        the patient's family.                       - High fiber diet. Procedure Code(s):    --- Professional ---                       Y7829, Colorectal cancer screening; colonoscopy on                        individual at high risk Diagnosis Code(s):    --- Professional ---                       Z86.010, Personal history of colonic polyps                       K64.9, Unspecified hemorrhoids                       K57.30, Diverticulosis of large intestine without                        perforation or abscess without bleeding CPT copyright 2018 American Medical Association. All rights reserved. The codes documented in this  report are preliminary and upon coder review may  be revised to meet current compliance requirements.  Vonda Antigua, MD Margretta Sidle B. Bonna Gains MD, MD 03/12/2018 9:40:57 AM This report has been signed electronically. Number of Addenda: 0 Note Initiated On: 03/12/2018 9:02 AM Scope Withdrawal Time: 0 hours 8 minutes 55 seconds  Total Procedure Duration: 0 hours 17 minutes 14 seconds       [facility]

## 2018-03-12 NOTE — Anesthesia Procedure Notes (Signed)
Performed by: Nithya Meriweather, CRNA Pre-anesthesia Checklist: Patient identified, Emergency Drugs available, Suction available, Timeout performed and Patient being monitored Patient Re-evaluated:Patient Re-evaluated prior to induction Oxygen Delivery Method: Nasal cannula Placement Confirmation: positive ETCO2       

## 2022-03-06 ENCOUNTER — Encounter: Payer: Self-pay | Admitting: Ophthalmology

## 2022-03-09 NOTE — Discharge Instructions (Signed)

## 2022-03-13 ENCOUNTER — Other Ambulatory Visit: Payer: Self-pay

## 2022-03-13 ENCOUNTER — Encounter: Payer: Self-pay | Admitting: Ophthalmology

## 2022-03-13 ENCOUNTER — Encounter
Admission: RE | Disposition: A | Discharge status: Home / Self Care | Payer: Self-pay | Source: Home / Self Care | Attending: Ophthalmology

## 2022-03-13 ENCOUNTER — Ambulatory Visit: Payer: Medicare Other | Admitting: Anesthesiology

## 2022-03-13 ENCOUNTER — Ambulatory Visit
Admission: RE | Admit: 2022-03-13 | Discharge: 2022-03-13 | Disposition: A | Discharge status: Home / Self Care | Payer: Medicare Other | Attending: Ophthalmology | Admitting: Ophthalmology

## 2022-03-13 DIAGNOSIS — H2511 Age-related nuclear cataract, right eye: Secondary | ICD-10-CM | POA: Insufficient documentation

## 2022-03-13 DIAGNOSIS — J45909 Unspecified asthma, uncomplicated: Secondary | ICD-10-CM | POA: Insufficient documentation

## 2022-03-13 DIAGNOSIS — H401111 Primary open-angle glaucoma, right eye, mild stage: Secondary | ICD-10-CM | POA: Diagnosis not present

## 2022-03-13 DIAGNOSIS — K219 Gastro-esophageal reflux disease without esophagitis: Secondary | ICD-10-CM | POA: Insufficient documentation

## 2022-03-13 DIAGNOSIS — Z87891 Personal history of nicotine dependence: Secondary | ICD-10-CM | POA: Diagnosis not present

## 2022-03-13 DIAGNOSIS — I10 Essential (primary) hypertension: Secondary | ICD-10-CM | POA: Insufficient documentation

## 2022-03-13 DIAGNOSIS — Z79899 Other long term (current) drug therapy: Secondary | ICD-10-CM | POA: Insufficient documentation

## 2022-03-13 DIAGNOSIS — E039 Hypothyroidism, unspecified: Secondary | ICD-10-CM | POA: Insufficient documentation

## 2022-03-13 HISTORY — PX: CATARACT EXTRACTION W/PHACO: SHX586

## 2022-03-13 SURGERY — PHACOEMULSIFICATION, CATARACT, WITH IOL INSERTION
Anesthesia: Monitor Anesthesia Care | Site: Eye | Laterality: Right

## 2022-03-13 MED ORDER — LIDOCAINE HCL (PF) 2 % IJ SOLN
INTRAOCULAR | Status: DC | PRN
  Administered 2022-03-13: 1 mL via INTRAOCULAR

## 2022-03-13 MED ORDER — FENTANYL CITRATE (PF) 100 MCG/2ML IJ SOLN
INTRAMUSCULAR | Status: DC | PRN
  Administered 2022-03-13 (×2): 25 ug via INTRAVENOUS

## 2022-03-13 MED ORDER — TETRACAINE HCL 0.5 % OP SOLN
1.0000 [drp] | OPHTHALMIC | Status: AC | PRN
  Administered 2022-03-13 (×3): 1 [drp] via OPHTHALMIC

## 2022-03-13 MED ORDER — MIDAZOLAM HCL 2 MG/2ML IJ SOLN
INTRAMUSCULAR | Status: DC | PRN
  Administered 2022-03-13: 2 mg via INTRAVENOUS

## 2022-03-13 MED ORDER — SIGHTPATH DOSE#1 NA HYALUR & NA CHOND-NA HYALUR IO KIT
PACK | INTRAOCULAR | Status: DC | PRN
  Administered 2022-03-13: 1 via OPHTHALMIC

## 2022-03-13 MED ORDER — ONDANSETRON HCL 4 MG/2ML IJ SOLN: 4.0000 mg | Freq: Once | INTRAMUSCULAR | Status: DC | PRN

## 2022-03-13 MED ORDER — SIGHTPATH DOSE#1 BSS IO SOLN
INTRAOCULAR | Status: DC | PRN
  Administered 2022-03-13: 81 mL via OPHTHALMIC

## 2022-03-13 MED ORDER — SIGHTPATH DOSE#1 BSS IO SOLN
INTRAOCULAR | Status: DC | PRN
  Administered 2022-03-13: 15 mL

## 2022-03-13 MED ORDER — ARMC OPHTHALMIC DILATING DROPS
1.0000 | OPHTHALMIC | Status: AC | PRN
  Administered 2022-03-13 (×3): 1 via OPHTHALMIC

## 2022-03-13 MED ORDER — MOXIFLOXACIN HCL 0.5 % OP SOLN
OPHTHALMIC | Status: DC | PRN
  Administered 2022-03-13: .2 mL via OPHTHALMIC

## 2022-03-13 SURGICAL SUPPLY — 11 items
CATARACT SUITE SIGHTPATH (MISCELLANEOUS) ×1 IMPLANT
DISSECTOR HYDRO NUCLEUS 50X22 (MISCELLANEOUS) ×1 IMPLANT
FEE CATARACT SUITE SIGHTPATH (MISCELLANEOUS) ×1 IMPLANT
GLOVE SURG GAMMEX PI TX LF 7.5 (GLOVE) ×1 IMPLANT
LENS IOL TECNIS EYHANCE 21.0 (Intraocular Lens) IMPLANT
NDL FILTER BLUNT 18X1 1/2 (NEEDLE) ×1 IMPLANT
NEEDLE FILTER BLUNT 18X1 1/2 (NEEDLE) ×1 IMPLANT
STENT OPTH STRL GLAUCOMA IMPLANT
SYR 3ML LL SCALE MARK (SYRINGE) ×1 IMPLANT
SYR 5ML LL (SYRINGE) ×1 IMPLANT
WATER STERILE IRR 250ML POUR (IV SOLUTION) ×1 IMPLANT

## 2022-03-13 NOTE — H&P (Signed)
[facility]   Primary Care Physician:  Lynnell Jude, MD Ophthalmologist: Dr. Benay Pillow  Pre-Procedure History & Physical: HPI:  Lisa Hale is a 76 y.o. female here for cataract surgery.   Past Medical History:  Diagnosis Date   Asthma    GERD (gastroesophageal reflux disease)    Glaucoma    Hypertension    Hypothyroidism    Scoliosis    Seasonal allergies    Skin cancer    Vertigo    Avg: 1x/mo    Past Surgical History:  Procedure Laterality Date   COLONOSCOPY     COLONOSCOPY WITH PROPOFOL N/A 03/12/2018   Procedure: COLONOSCOPY WITH PROPOFOL;  Surgeon: Virgel Manifold, MD;  Location: [place];  Service: Endoscopy;  Laterality: N/A;   ORIF TIBIA & FIBULA FRACTURES     TONSILLECTOMY      Prior to Admission medications   Medication Sig Start Date End Date Taking? Authorizing Provider  albuterol (PROVENTIL HFA;VENTOLIN HFA) 108 (90 Base) MCG/ACT inhaler Inhale into the lungs every 6 (six) hours as needed for wheezing or shortness of breath.   Yes [provider]  cholecalciferol 25 MCG (1000 UT) TABS Take by mouth daily.   Yes [provider]  dextromethorphan-guaiFENesin (ROBITUSSIN-DM) 10-100 MG/5ML liquid Take 5 mLs by mouth every 4 (four) hours as needed for cough.   Yes [provider]  fluticasone (FLOVENT HFA) 220 MCG/ACT inhaler Inhale into the lungs 2 (two) times daily as needed.   Yes [provider]  lansoprazole (PREVACID) 15 MG capsule Take 15 mg by mouth daily at 12 noon.   Yes [provider]  levothyroxine (SYNTHROID, LEVOTHROID) 200 MCG tablet Take 200 mcg by mouth daily before breakfast.   Yes [provider]  montelukast (SINGULAIR) 10 MG tablet Take 10 mg by mouth at bedtime.   Yes [provider]  niacinamide 500 MG tablet Take 500 mg by mouth daily.   Yes [provider]  Tafluprost (ZIOPTAN OP) Place 1 drop into both eyes at bedtime.   Yes  [provider]  triamterene-hydrochlorothiazide (MAXZIDE-25) 37.5-25 MG tablet Take 1 tablet by mouth daily.   Yes [provider]  Turmeric Curcumin 500 MG CAPS Take by mouth daily.   Yes [provider]    Allergies as of 03/01/2022 - Review Complete 03/12/2018  Allergen Reaction Noted   Sunflower oil Anaphylaxis 03/06/2018   Bactrim [sulfamethoxazole-trimethoprim] Itching 03/06/2018   Pineapple Itching 03/06/2018   Tetracycline Swelling     History reviewed. No pertinent family history.  Social History   Socioeconomic History   Marital status: Widowed    Spouse name: Not on file   Number of children: Not on file   Years of education: Not on file   Highest education level: Not on file  Occupational History   Not on file  Tobacco Use   Smoking status: Former    Types: Cigarettes    Quit date: 1999    Years since quitting: 24.8   Smokeless tobacco: Never   Tobacco comments:    has had coccasional cig since quitting  Vaping Use   Vaping Use: Never used  Substance and Sexual Activity   Alcohol use: Yes    Comment: couple x/yr   Drug use: Not on file   Sexual activity: Not on file  Other Topics Concern   Not on file  Social History Narrative   Not on file   Social Determinants of Health  Financial Resource Strain: Not on file  Food Insecurity: Not on file  Transportation Needs: Not on file  Physical Activity: Not on file  Stress: Not on file  Social Connections: Not on file  Intimate Partner Violence: Not on file    Review of Systems: See HPI, otherwise negative ROS  Physical Exam: BP (!) 195/76   Pulse 78   Temp (!) 97.5 F (36.4 C) (Temporal)   Resp 18   Ht '5\' 3"'$  (1.6 m)   Wt 73 kg   SpO2 99%   BMI 28.52 kg/m  General:   Alert, cooperative in NAD Head:  Normocephalic and atraumatic. Respiratory:  Normal work of breathing. Cardiovascular:  RRR  Impression/Plan: Lisa Hale is here for cataract  surgery.  Risks, benefits, limitations, and alternatives regarding cataract surgery have been reviewed with the patient.  Questions have been answered.  All parties agreeable.   Benay Pillow, MD  03/13/2022, 7:51 AM

## 2022-03-13 NOTE — Anesthesia Preprocedure Evaluation (Signed)
Anesthesia Evaluation  Patient identified by MRN, date of birth, ID band Patient awake    Reviewed: Allergy & Precautions, NPO status , Patient's Chart, lab work & pertinent test results, reviewed documented beta blocker date and time   History of Anesthesia Complications Negative for: history of anesthetic complications  Airway Mallampati: II  TM Distance: >3 FB Neck ROM: Full    Dental no notable dental hx. (+) Teeth Intact   Pulmonary asthma , neg sleep apnea, neg COPD, Patient abstained from smoking.Not current smoker, former smoker   Pulmonary exam normal breath sounds clear to auscultation       Cardiovascular Exercise Tolerance: Good METShypertension, Pt. on medications (-) CAD and (-) Past MI Normal cardiovascular exam(-) dysrhythmias  Rhythm:Regular Rate:Normal - Systolic murmurs    Neuro/Psych negative neurological ROS  negative psych ROS   GI/Hepatic ,GERD  Medicated and Controlled,,(+)     (-) substance abuse    Endo/Other  neg diabetesHypothyroidism    Renal/GU negative Renal ROS     Musculoskeletal negative musculoskeletal ROS (+)    Abdominal  (+) + obese  Peds  Hematology negative hematology ROS (+)   Anesthesia Other Findings Past Medical History: No date: Asthma No date: GERD (gastroesophageal reflux disease) No date: Glaucoma No date: Hypertension No date: Hypothyroidism No date: Scoliosis No date: Seasonal allergies No date: Skin cancer No date: Vertigo     Comment:  Avg: 1x/mo  Reproductive/Obstetrics                              Anesthesia Physical Anesthesia Plan  ASA: 2  Anesthesia Plan: MAC   Post-op Pain Management: Minimal or no pain anticipated   Induction: Intravenous  PONV Risk Score and Plan: 2 and Midazolam  Airway Management Planned: Natural Airway  Additional Equipment: None  Intra-op Plan:   Post-operative Plan:   Informed  Consent: I have reviewed the patients History and Physical, chart, labs and discussed the procedure including the risks, benefits and alternatives for the proposed anesthesia with the patient or authorized representative who has indicated his/her understanding and acceptance.       Plan Discussed with: CRNA, Anesthesiologist and Surgeon  Anesthesia Plan Comments: (Explained risks of anesthesia, including PONV, and rare emergencies such as cardiac events, respiratory problems, and allergic reactions, requiring invasive intervention. Discussed the role of CRNA in patient's perioperative care. Patient understands. )         Anesthesia Quick Evaluation

## 2022-03-13 NOTE — Anesthesia Postprocedure Evaluation (Signed)
Anesthesia Post Note  Patient: Lisa Hale  Procedure(s) Performed: CATARACT EXTRACTION PHACO AND INTRAOCULAR LENS PLACEMENT (IOC) RIGHT WITH HYDRUS MICROSTENT  6.72  00:37.7 (Right: Eye)  Patient location during evaluation: PACU Anesthesia Type: MAC Level of consciousness: awake and alert Pain management: pain level controlled Vital Signs Assessment: post-procedure vital signs reviewed and stable Respiratory status: spontaneous breathing, nonlabored ventilation, respiratory function stable and patient connected to nasal cannula oxygen Cardiovascular status: stable and blood pressure returned to baseline Postop Assessment: no apparent nausea or vomiting Anesthetic complications: no   There were no known notable events for this encounter.   Last Vitals:  Vitals:   03/13/22 0705 03/13/22 0828  BP: (!) 195/76 126/75  Pulse: 78 63  Resp: 18 11  Temp: (!) 36.4 C 36.5 C  SpO2: 99% 99%    Last Pain:  Vitals:   03/13/22 0828  TempSrc:   PainSc: 0-No pain                 Arita Miss

## 2022-03-13 NOTE — Op Note (Signed)
OPERATIVE NOTE  Lisa Hale [id number] 03/13/2022  PREOPERATIVE DIAGNOSIS:   1.  Mild PRIMARY open angle glaucoma, right eye. H40.1111  2.  Nuclear sclerotic cataract right eye.  H25.11   POSTOPERATIVE DIAGNOSIS:    same.   PROCEDURE:   1.  Placement of trabecular bypass stent (hydrus) and phacoemusification with posterior chamber intraocular lens placement of the right eye  CPT [phone]   LENS: Implant Name Type Inv. Item Serial No. Manufacturer Lot No. LRB No. Used Action  STENT OPTH STRL GLAUCOMA - [id number]  STENT OPTH STRL GLAUCOMA  [employer] [id number] Right 1 Implanted  LENS IOL TECNIS EYHANCE 21.0 - [id number] Intraocular Lens LENS IOL TECNIS EYHANCE 21.0 [id number] [employer]  Right 1 Implanted      Procedure(s): CATARACT EXTRACTION PHACO AND INTRAOCULAR LENS PLACEMENT (IOC) RIGHT WITH HYDRUS MICROSTENT  6.72  00:37.7 (Right)  DIB00 +21.0   SURGEON:  Benay Pillow, MD, MPH  ANESTHESIOLOGIST: Anesthesiologist: Arita Miss, MD CRNA: Patience Musca., CRNA   ANESTHESIA:  MAC and intracameral preservative-free lidocaine 4%.  ESTIMATED BLOOD LOSS: less than 1 mL.   COMPLICATIONS:  None.   DESCRIPTION OF PROCEDURE:  The patient was identified in the holding room and transported to the operating room.   The patient was placed in the supine position under the operating microscope.  The right eye was prepped and draped in the usual sterile ophthalmic fashion.   A 1.0 millimeter clear-corneal paracentesis was made at the 10:30 position and a second paracentesis at 7:00.  0.5 ml of preservative-free 1% lidocaine with epinephrine was injected into the anterior chamber.  The anterior chamber was filled with viscoelastic.  A 2.4 millimeter keratome was used to make a near-clear corneal incision at the 8:00 position.   Attention was turned to the hydrus.  The patients head was turned to the left and the microscope was tilted to 035 degrees.  Ocular  instruments/Glaukos OAL/H2 gonioprism was used coupled with viscoelastic on the cornea was used to visualize the trabecular meshwork. The hydrus was opened and introduced into the eye.  The meshwork was engaged with the tip of the injector and the hydrus stent was deployed into Schlemm's canal at 4:00.  The stent was well seated and in good position.  Next, attention was turned to the phacoemulsification A curvilinear capsulorrhexis was made with a cystotome and capsulorrhexis forceps.  Balanced salt solution was used to hydrodissect and hydrodelineate the nucleus.   Phacoemulsification was then used in stop and chop fashion to remove the lens nucleus and epinucleus.  The remaining cortex was then removed using the irrigation and aspiration handpiece. Viscoelastic was then placed into the capsular bag to distend it for lens placement.  A lens was then injected into the capsular bag.  The remaining viscoelastic was aspirated.   Wounds were hydrated with balanced salt solution.  The anterior chamber was inflated to a physiologic pressure with balanced salt solution.   Intracameral vigamox 0.1 mL undiluted was injected into the eye and a drop placed onto the ocular surface.  No wound leaks were noted. The patient was taken to the recovery room in stable condition without complications of anesthesia or surgery   Benay Pillow 03/13/2022, 8:26 AM

## 2022-03-13 NOTE — Transfer of Care (Signed)
Immediate Anesthesia Transfer of Care Note  Patient: Lisa Hale  Procedure(s) Performed: CATARACT EXTRACTION PHACO AND INTRAOCULAR LENS PLACEMENT (IOC) RIGHT WITH HYDRUS MICROSTENT  6.72  00:37.7 (Right: Eye)  Patient Location: PACU  Anesthesia Type: MAC  Level of Consciousness: awake, alert  and patient cooperative  Airway and Oxygen Therapy: Patient Spontanous Breathing and Patient connected to supplemental oxygen  Post-op Assessment: Post-op Vital signs reviewed, Patient's Cardiovascular Status Stable, Respiratory Function Stable, Patent Airway and No signs of Nausea or vomiting  Post-op Vital Signs: Reviewed and stable  Complications: There were no known notable events for this encounter.

## 2022-03-14 ENCOUNTER — Encounter: Payer: Self-pay | Admitting: Ophthalmology

## 2022-03-16 ENCOUNTER — Encounter: Payer: Self-pay | Admitting: Ophthalmology

## 2022-03-21 NOTE — Discharge Instructions (Signed)

## 2022-03-27 ENCOUNTER — Encounter
Admission: RE | Disposition: A | Discharge status: Home / Self Care | Payer: Self-pay | Source: Home / Self Care | Attending: Ophthalmology

## 2022-03-27 ENCOUNTER — Ambulatory Visit: Payer: Medicare Other | Admitting: Anesthesiology

## 2022-03-27 ENCOUNTER — Encounter: Payer: Self-pay | Admitting: Ophthalmology

## 2022-03-27 ENCOUNTER — Other Ambulatory Visit: Payer: Self-pay

## 2022-03-27 ENCOUNTER — Ambulatory Visit
Admission: RE | Admit: 2022-03-27 | Discharge: 2022-03-27 | Disposition: A | Discharge status: Home / Self Care | Payer: Medicare Other | Attending: Ophthalmology | Admitting: Ophthalmology

## 2022-03-27 DIAGNOSIS — J45909 Unspecified asthma, uncomplicated: Secondary | ICD-10-CM | POA: Diagnosis not present

## 2022-03-27 DIAGNOSIS — H401121 Primary open-angle glaucoma, left eye, mild stage: Secondary | ICD-10-CM | POA: Insufficient documentation

## 2022-03-27 DIAGNOSIS — Z87891 Personal history of nicotine dependence: Secondary | ICD-10-CM | POA: Diagnosis not present

## 2022-03-27 DIAGNOSIS — H2512 Age-related nuclear cataract, left eye: Secondary | ICD-10-CM | POA: Insufficient documentation

## 2022-03-27 DIAGNOSIS — I10 Essential (primary) hypertension: Secondary | ICD-10-CM | POA: Insufficient documentation

## 2022-03-27 DIAGNOSIS — K219 Gastro-esophageal reflux disease without esophagitis: Secondary | ICD-10-CM | POA: Insufficient documentation

## 2022-03-27 DIAGNOSIS — E1136 Type 2 diabetes mellitus with diabetic cataract: Secondary | ICD-10-CM | POA: Diagnosis present

## 2022-03-27 HISTORY — PX: CATARACT EXTRACTION W/PHACO: SHX586

## 2022-03-27 SURGERY — PHACOEMULSIFICATION, CATARACT, WITH IOL INSERTION
Anesthesia: Monitor Anesthesia Care | Site: Eye | Laterality: Left

## 2022-03-27 MED ORDER — FENTANYL CITRATE (PF) 100 MCG/2ML IJ SOLN
INTRAMUSCULAR | Status: DC | PRN
  Administered 2022-03-27: 50 ug via INTRAVENOUS

## 2022-03-27 MED ORDER — MIDAZOLAM HCL 2 MG/2ML IJ SOLN
INTRAMUSCULAR | Status: DC | PRN
  Administered 2022-03-27: 1 mg via INTRAVENOUS

## 2022-03-27 MED ORDER — LIDOCAINE HCL (PF) 2 % IJ SOLN
INTRAOCULAR | Status: DC | PRN
  Administered 2022-03-27: 4 mL via INTRAOCULAR

## 2022-03-27 MED ORDER — ARMC OPHTHALMIC DILATING DROPS
1.0000 | OPHTHALMIC | Status: DC | PRN
  Administered 2022-03-27 (×3): 1 via OPHTHALMIC

## 2022-03-27 MED ORDER — LACTATED RINGERS IV SOLN: INTRAVENOUS | Status: DC

## 2022-03-27 MED ORDER — SIGHTPATH DOSE#1 BSS IO SOLN
INTRAOCULAR | Status: DC | PRN
  Administered 2022-03-27: 65 mL via OPHTHALMIC

## 2022-03-27 MED ORDER — MOXIFLOXACIN HCL 0.5 % OP SOLN
OPHTHALMIC | Status: DC | PRN
  Administered 2022-03-27: .2 mL via OPHTHALMIC

## 2022-03-27 MED ORDER — SIGHTPATH DOSE#1 NA HYALUR & NA CHOND-NA HYALUR IO KIT
PACK | INTRAOCULAR | Status: DC | PRN
  Administered 2022-03-27: 1 via OPHTHALMIC

## 2022-03-27 MED ORDER — SIGHTPATH DOSE#1 BSS IO SOLN
INTRAOCULAR | Status: DC | PRN
  Administered 2022-03-27: 15 mL via INTRAOCULAR

## 2022-03-27 MED ORDER — TETRACAINE HCL 0.5 % OP SOLN
1.0000 [drp] | OPHTHALMIC | Status: DC | PRN
  Administered 2022-03-27 (×3): 1 [drp] via OPHTHALMIC

## 2022-03-27 SURGICAL SUPPLY — 20 items
CANNULA ANT/CHMB 27G (MISCELLANEOUS) IMPLANT
CANNULA ANT/CHMB 27GA (MISCELLANEOUS) IMPLANT
CATARACT SUITE SIGHTPATH (MISCELLANEOUS) ×1 IMPLANT
DISSECTOR HYDRO NUCLEUS 50X22 (MISCELLANEOUS) ×1 IMPLANT
FEE CATARACT SUITE SIGHTPATH (MISCELLANEOUS) ×1 IMPLANT
GLOVE SURG GAMMEX PI TX LF 7.5 (GLOVE) ×1 IMPLANT
GLOVE SURG SYN 8.5  E (GLOVE) ×1
GLOVE SURG SYN 8.5 E (GLOVE) ×1 IMPLANT
GLOVE SURG SYN 8.5 PF PI (GLOVE) ×1 IMPLANT
LENS IOL TECNIS EYHANCE 21.5 (Intraocular Lens) IMPLANT
NDL FILTER BLUNT 18X1 1/2 (NEEDLE) ×1 IMPLANT
NEEDLE FILTER BLUNT 18X1 1/2 (NEEDLE) ×1 IMPLANT
PACK VIT ANT 23G (MISCELLANEOUS) IMPLANT
RING MALYGIN (MISCELLANEOUS) IMPLANT
STENT OPTH STRL GLAUCOMA IMPLANT
SUT ETHILON 10-0 CS-B-6CS-B-6 (SUTURE)
SUTURE EHLN 10-0 CS-B-6CS-B-6 (SUTURE) IMPLANT
SYR 3ML LL SCALE MARK (SYRINGE) ×1 IMPLANT
SYR 5ML LL (SYRINGE) ×1 IMPLANT
WATER STERILE IRR 250ML POUR (IV SOLUTION) ×1 IMPLANT

## 2022-03-27 NOTE — Anesthesia Preprocedure Evaluation (Signed)
Anesthesia Evaluation  Patient identified by MRN, date of birth, ID band Patient awake    Reviewed: Allergy & Precautions, H&P , NPO status , Patient's Chart, lab work & pertinent test results, reviewed documented beta blocker date and time   Airway Mallampati: II  TM Distance: >3 FB Neck ROM: full    Dental no notable dental hx. (+) Teeth Intact   Pulmonary asthma , former smoker   Pulmonary exam normal breath sounds clear to auscultation       Cardiovascular Exercise Tolerance: Good hypertension, On Medications negative cardio ROS  Rhythm:regular Rate:Normal     Neuro/Psych negative neurological ROS  negative psych ROS   GI/Hepatic Neg liver ROS,GERD  Medicated,,  Endo/Other  diabetesHypothyroidism    Renal/GU      Musculoskeletal   Abdominal   Peds  Hematology negative hematology ROS (+)   Anesthesia Other Findings   Reproductive/Obstetrics negative OB ROS                             Anesthesia Physical Anesthesia Plan  ASA: 2  Anesthesia Plan: MAC   Post-op Pain Management:    Induction:   PONV Risk Score and Plan:   Airway Management Planned:   Additional Equipment:   Intra-op Plan:   Post-operative Plan:   Informed Consent: I have reviewed the patients History and Physical, chart, labs and discussed the procedure including the risks, benefits and alternatives for the proposed anesthesia with the patient or authorized representative who has indicated his/her understanding and acceptance.       Plan Discussed with: CRNA  Anesthesia Plan Comments:        Anesthesia Quick Evaluation

## 2022-03-27 NOTE — Transfer of Care (Signed)
Immediate Anesthesia Transfer of Care Note  Patient: Lisa Hale  Procedure(s) Performed: CATARACT EXTRACTION PHACO AND INTRAOCULAR LENS PLACEMENT (IOC) LEFT WITH IOL PLUS HYDRUS MICROSTENT 5.21 00:30.9 (Left: Eye)  Patient Location: PACU  Anesthesia Type: MAC  Level of Consciousness: awake, alert  and patient cooperative  Airway and Oxygen Therapy: Patient Spontanous Breathing and Patient connected to supplemental oxygen  Post-op Assessment: Post-op Vital signs reviewed, Patient's Cardiovascular Status Stable, Respiratory Function Stable, Patent Airway and No signs of Nausea or vomiting  Post-op Vital Signs: Reviewed and stable  Complications: No notable events documented.

## 2022-03-27 NOTE — H&P (Signed)
[facility]   Primary Care Physician:  Lynnell Jude, MD Ophthalmologist: Dr. Benay Pillow  Pre-Procedure History & Physical: HPI:  Lisa Hale is a 76 y.o. female here for cataract surgery.   Past Medical History:  Diagnosis Date   Asthma    GERD (gastroesophageal reflux disease)    Glaucoma    Hypertension    Hypothyroidism    Scoliosis    Seasonal allergies    Skin cancer    Vertigo    Avg: 1x/mo    Past Surgical History:  Procedure Laterality Date   CATARACT EXTRACTION W/PHACO Right 03/13/2022   Procedure: CATARACT EXTRACTION PHACO AND INTRAOCULAR LENS PLACEMENT (IOC) RIGHT WITH HYDRUS MICROSTENT  6.72  00:37.7;  Surgeon: Eulogio Bear, MD;  Location: [place];  Service: Ophthalmology;  Laterality: Right;   COLONOSCOPY     COLONOSCOPY WITH PROPOFOL N/A 03/12/2018   Procedure: COLONOSCOPY WITH PROPOFOL;  Surgeon: Virgel Manifold, MD;  Location: [place];  Service: Endoscopy;  Laterality: N/A;   ORIF TIBIA & FIBULA FRACTURES     TONSILLECTOMY      Prior to Admission medications   Medication Sig Start Date End Date Taking? Authorizing Provider  cholecalciferol 25 MCG (1000 UT) TABS Take by mouth daily.   Yes [provider]  dextromethorphan-guaiFENesin (ROBITUSSIN-DM) 10-100 MG/5ML liquid Take 5 mLs by mouth every 4 (four) hours as needed for cough.   Yes [provider]  fluticasone (FLOVENT HFA) 220 MCG/ACT inhaler Inhale into the lungs 2 (two) times daily as needed.   Yes [provider]  lansoprazole (PREVACID) 15 MG capsule Take 15 mg by mouth daily at 12 noon.   Yes [provider]  levothyroxine (SYNTHROID, LEVOTHROID) 200 MCG tablet Take 200 mcg by mouth daily before breakfast.   Yes [provider]  montelukast (SINGULAIR) 10 MG tablet Take 10 mg by mouth at bedtime.   Yes [provider]  niacinamide 500 MG tablet Take 500 mg by mouth daily.   Yes [provider]  Tafluprost (ZIOPTAN OP) Place 1 drop into both eyes at bedtime.   Yes [provider]  triamterene-hydrochlorothiazide (MAXZIDE-25) 37.5-25 MG tablet Take 1 tablet by mouth daily.   Yes [provider]  Turmeric Curcumin 500 MG CAPS Take by mouth daily.   Yes [provider]  albuterol (PROVENTIL HFA;VENTOLIN HFA) 108 (90 Base) MCG/ACT inhaler Inhale into the lungs every 6 (six) hours as needed for wheezing or shortness of breath.    [provider]    Allergies as of 03/01/2022 - Review Complete 03/12/2018  Allergen Reaction Noted   Sunflower oil Anaphylaxis 03/06/2018   Bactrim [sulfamethoxazole-trimethoprim] Itching 03/06/2018   Pineapple Itching 03/06/2018   Tetracycline Swelling     History reviewed. No pertinent family history.  Social History   Socioeconomic History   Marital status: Widowed    Spouse name: Not on file   Number of children: Not on file   Years of education: Not on file   Highest education level: Not on file  Occupational History   Not on file  Tobacco Use   Smoking status: Former    Types: Cigarettes    Quit date: 1999    Years since quitting: 24.9   Smokeless tobacco: Never   Tobacco comments:    has had coccasional cig since quitting  Vaping Use   Vaping Use: Never used  Substance and Sexual Activity   Alcohol use: Yes  Comment: couple x/yr   Drug use: Not on file   Sexual activity: Not on file  Other Topics Concern   Not on file  Social History Narrative   Not on file   Social Determinants of Health   Financial Resource Strain: Not on file  Food Insecurity: Not on file  Transportation Needs: Not on file  Physical Activity: Not on file  Stress: Not on file  Social Connections: Not on file  Intimate Partner Violence: Not on file    Review of Systems: See HPI, otherwise negative ROS  Physical Exam: BP (!) 161/77   Temp (!) 97.3 F (36.3 C) (Tympanic)   Resp 18   Ht 5' 2.99"  (1.6 m)   Wt 72.4 kg   SpO2 98%   BMI 28.30 kg/m  General:   Alert, cooperative in NAD Head:  Normocephalic and atraumatic. Respiratory:  Normal work of breathing. Cardiovascular:  RRR  Impression/Plan: Lisa Hale is here for cataract surgery.  Risks, benefits, limitations, and alternatives regarding cataract surgery have been reviewed with the patient.  Questions have been answered.  All parties agreeable.   Benay Pillow, MD  03/27/2022, 12:51 PM

## 2022-03-27 NOTE — Op Note (Signed)
OPERATIVE NOTE  Lisa Hale [id number] 03/27/2022  PREOPERATIVE DIAGNOSIS:   1.  Mild  PRIMARY open angle glaucoma, left eye. X52.8413  2.  Nuclear sclerotic cataract left eye.  H25.12   POSTOPERATIVE DIAGNOSIS:    same.   PROCEDURE:   1.  Placement of trabecular bypass stent (hydrus) and phacoemusification with posterior chamber intraocular lens placement of the left eye  CPT [phone]   LENS: Implant Name Type Inv. Item Serial No. Manufacturer Lot No. LRB No. Used Action  STENT OPTH STRL GLAUCOMA - [id number]  STENT OPTH STRL GLAUCOMA  [employer] [id number] Left 1 Implanted  LENS IOL TECNIS EYHANCE 21.5 - [id number] Intraocular Lens LENS IOL TECNIS EYHANCE 21.5 [id number] [employer]  Left 1 Implanted      Procedure(s): CATARACT EXTRACTION PHACO AND INTRAOCULAR LENS PLACEMENT (IOC) LEFT WITH IOL PLUS HYDRUS MICROSTENT 5.21 00:30.9 (Left)  DIB00 +21.5  ULTRASOUND TIME: 0  minutes 30 seconds.  CDE 5.21   SURGEON:  Benay Pillow, MD, MPH  ANESTHESIOLOGIST: Anesthesiologist: Molli Barrows, MD CRNA: Jacqualin Combes, CRNA   ANESTHESIA:  MAC  and intracameral preservative-free intracameral lidocaine 4%.  ESTIMATED BLOOD LOSS: less than 1 mL.   COMPLICATIONS:  None.   DESCRIPTION OF PROCEDURE:  The patient was identified in the holding room and transported to the operating room.  The patient was placed in the supine position under the operating microscope.  The left eye was prepped and draped in the usual sterile ophthalmic fashion.   A 1.0 millimeter clear-corneal paracentesis was made at the 4:30 position, and a second paracentesis was made at 1:30 for the hydrus. 0.5 ml of preservative-free 1% lidocaine with epinephrine was injected into the anterior chamber.  The anterior chamber was filled with Healon 5 viscoelastic.  A 2.4 millimeter keratome was used to make a near-clear corneal incision at the 2:00 position.   Attention was turned to the hydrus stent.  The  patients head was turned to the left and the microscope was tilted to 035 degrees.  Ocular instruments/Glaukos OAL/H2 gonioprism was used with Healon 5 on the cornea to visualize the trabecular meshwork. The hydrus was introduced into the eye and the  meshwork was engaged with the tip of the and the stent was deployed into Schlemm's canal.  The stent was well seated and in good position.  Next, attention was turned to the phacoemulsification A curvilinear capsulorrhexis was made with a cystotome and capsulorrhexis forceps.  Balanced salt solution was used to hydrodissect and hydrodelineate the nucleus.   Phacoemulsification was then used in stop and chop fashion to remove the lens nucleus and epinucleus.  The remaining cortex was then removed using the irrigation and aspiration handpiece. Healon was then placed into the capsular bag to distend it for lens placement.  A lens was then injected into the capsular bag.  The remaining viscoelastic was aspirated.   Wounds were hydrated with balanced salt solution.  The anterior chamber was inflated to a physiologic pressure with balanced salt solution.   Intracameral vigamox 0.1 mL undiluted was injected into the eye and a drop placed onto the ocular surface.  No wound leaks were noted.  Protective glasses were placed on the patient.  The patient was taken to the recovery room in stable condition without complications of anesthesia or surgery   Benay Pillow 03/27/2022, 1:23 PM

## 2022-03-28 ENCOUNTER — Encounter: Payer: Self-pay | Admitting: Ophthalmology

## 2022-03-28 NOTE — Anesthesia Postprocedure Evaluation (Signed)
Anesthesia Post Note  Patient: Lisa Hale  Procedure(s) Performed: CATARACT EXTRACTION PHACO AND INTRAOCULAR LENS PLACEMENT (IOC) LEFT WITH IOL PLUS HYDRUS MICROSTENT 5.21 00:30.9 (Left: Eye)  Patient location during evaluation: PACU Anesthesia Type: MAC Level of consciousness: awake and alert Pain management: pain level controlled Vital Signs Assessment: post-procedure vital signs reviewed and stable Respiratory status: spontaneous breathing, nonlabored ventilation, respiratory function stable and patient connected to nasal cannula oxygen Cardiovascular status: blood pressure returned to baseline and stable Postop Assessment: no apparent nausea or vomiting Anesthetic complications: no   No notable events documented.   Last Vitals:  Vitals:   03/27/22 1330 03/27/22 1332  BP: (!) 140/71   Pulse: 69 62  Resp: 19 20  Temp:    SpO2: 100% 99%    Last Pain:  Vitals:   03/27/22 1332  TempSrc:   PainSc: 0-No pain                 Molli Barrows

## 2022-04-03 ENCOUNTER — Inpatient Hospital Stay
Admission: EM | Admit: 2022-04-03 | Discharge: 2022-04-06 | DRG: 321 | Disposition: A | Discharge status: Home / Self Care | Payer: Medicare Other | Source: Ambulatory Visit | Attending: Student in an Organized Health Care Education/Training Program | Admitting: Student in an Organized Health Care Education/Training Program

## 2022-04-03 ENCOUNTER — Encounter
Admission: EM | Disposition: A | Discharge status: Home / Self Care | Payer: Self-pay | Source: Home / Self Care | Attending: Student in an Organized Health Care Education/Training Program

## 2022-04-03 ENCOUNTER — Other Ambulatory Visit: Payer: Self-pay

## 2022-04-03 ENCOUNTER — Inpatient Hospital Stay: Payer: Medicare Other

## 2022-04-03 ENCOUNTER — Encounter: Payer: Self-pay | Admitting: Internal Medicine

## 2022-04-03 ENCOUNTER — Ambulatory Visit (INDEPENDENT_AMBULATORY_CARE_PROVIDER_SITE_OTHER)
Admission: EM | Admit: 2022-04-03 | Discharge: 2022-04-03 | Disposition: A | Discharge status: Home / Self Care | Payer: Medicare Other | Source: Home / Self Care

## 2022-04-03 DIAGNOSIS — R079 Chest pain, unspecified: Secondary | ICD-10-CM | POA: Insufficient documentation

## 2022-04-03 DIAGNOSIS — Z91018 Allergy to other foods: Secondary | ICD-10-CM

## 2022-04-03 DIAGNOSIS — E782 Mixed hyperlipidemia: Secondary | ICD-10-CM | POA: Diagnosis not present

## 2022-04-03 DIAGNOSIS — Z79890 Hormone replacement therapy (postmenopausal): Secondary | ICD-10-CM | POA: Diagnosis not present

## 2022-04-03 DIAGNOSIS — I110 Hypertensive heart disease with heart failure: Secondary | ICD-10-CM | POA: Diagnosis present

## 2022-04-03 DIAGNOSIS — Z83719 Family history of colon polyps, unspecified: Secondary | ICD-10-CM | POA: Diagnosis not present

## 2022-04-03 DIAGNOSIS — Z86010 Personal history of colonic polyps: Secondary | ICD-10-CM | POA: Diagnosis not present

## 2022-04-03 DIAGNOSIS — R791 Abnormal coagulation profile: Secondary | ICD-10-CM | POA: Diagnosis present

## 2022-04-03 DIAGNOSIS — Z961 Presence of intraocular lens: Secondary | ICD-10-CM | POA: Diagnosis present

## 2022-04-03 DIAGNOSIS — R7989 Other specified abnormal findings of blood chemistry: Secondary | ICD-10-CM | POA: Diagnosis not present

## 2022-04-03 DIAGNOSIS — K219 Gastro-esophageal reflux disease without esophagitis: Secondary | ICD-10-CM | POA: Diagnosis present

## 2022-04-03 DIAGNOSIS — Z79899 Other long term (current) drug therapy: Secondary | ICD-10-CM | POA: Diagnosis not present

## 2022-04-03 DIAGNOSIS — D72829 Elevated white blood cell count, unspecified: Secondary | ICD-10-CM | POA: Diagnosis present

## 2022-04-03 DIAGNOSIS — I213 ST elevation (STEMI) myocardial infarction of unspecified site: Principal | ICD-10-CM

## 2022-04-03 DIAGNOSIS — I2510 Atherosclerotic heart disease of native coronary artery without angina pectoris: Secondary | ICD-10-CM | POA: Diagnosis not present

## 2022-04-03 DIAGNOSIS — Z85828 Personal history of other malignant neoplasm of skin: Secondary | ICD-10-CM | POA: Diagnosis not present

## 2022-04-03 DIAGNOSIS — Z87891 Personal history of nicotine dependence: Secondary | ICD-10-CM | POA: Diagnosis not present

## 2022-04-03 DIAGNOSIS — E876 Hypokalemia: Secondary | ICD-10-CM | POA: Diagnosis present

## 2022-04-03 DIAGNOSIS — I249 Acute ischemic heart disease, unspecified: Secondary | ICD-10-CM

## 2022-04-03 DIAGNOSIS — I5020 Unspecified systolic (congestive) heart failure: Secondary | ICD-10-CM

## 2022-04-03 DIAGNOSIS — Z881 Allergy status to other antibiotic agents status: Secondary | ICD-10-CM

## 2022-04-03 DIAGNOSIS — J4520 Mild intermittent asthma, uncomplicated: Secondary | ICD-10-CM

## 2022-04-03 DIAGNOSIS — I2102 ST elevation (STEMI) myocardial infarction involving left anterior descending coronary artery: Secondary | ICD-10-CM | POA: Diagnosis present

## 2022-04-03 DIAGNOSIS — I255 Ischemic cardiomyopathy: Secondary | ICD-10-CM | POA: Diagnosis present

## 2022-04-03 DIAGNOSIS — I25119 Atherosclerotic heart disease of native coronary artery with unspecified angina pectoris: Secondary | ICD-10-CM | POA: Diagnosis present

## 2022-04-03 DIAGNOSIS — M419 Scoliosis, unspecified: Secondary | ICD-10-CM | POA: Diagnosis present

## 2022-04-03 DIAGNOSIS — Z8719 Personal history of other diseases of the digestive system: Secondary | ICD-10-CM | POA: Diagnosis not present

## 2022-04-03 DIAGNOSIS — I10 Essential (primary) hypertension: Secondary | ICD-10-CM | POA: Diagnosis not present

## 2022-04-03 DIAGNOSIS — Z9841 Cataract extraction status, right eye: Secondary | ICD-10-CM | POA: Diagnosis not present

## 2022-04-03 DIAGNOSIS — Z9842 Cataract extraction status, left eye: Secondary | ICD-10-CM | POA: Diagnosis not present

## 2022-04-03 DIAGNOSIS — I2109 ST elevation (STEMI) myocardial infarction involving other coronary artery of anterior wall: Secondary | ICD-10-CM | POA: Diagnosis present

## 2022-04-03 DIAGNOSIS — J45909 Unspecified asthma, uncomplicated: Secondary | ICD-10-CM | POA: Diagnosis present

## 2022-04-03 DIAGNOSIS — I5021 Acute systolic (congestive) heart failure: Secondary | ICD-10-CM | POA: Diagnosis present

## 2022-04-03 DIAGNOSIS — Z882 Allergy status to sulfonamides status: Secondary | ICD-10-CM

## 2022-04-03 DIAGNOSIS — E785 Hyperlipidemia, unspecified: Secondary | ICD-10-CM | POA: Diagnosis present

## 2022-04-03 DIAGNOSIS — E039 Hypothyroidism, unspecified: Secondary | ICD-10-CM | POA: Diagnosis present

## 2022-04-03 DIAGNOSIS — H409 Unspecified glaucoma: Secondary | ICD-10-CM | POA: Diagnosis present

## 2022-04-03 DIAGNOSIS — R748 Abnormal levels of other serum enzymes: Secondary | ICD-10-CM | POA: Diagnosis present

## 2022-04-03 DIAGNOSIS — E038 Other specified hypothyroidism: Secondary | ICD-10-CM | POA: Diagnosis not present

## 2022-04-03 HISTORY — PX: CORONARY/GRAFT ACUTE MI REVASCULARIZATION: CATH118305

## 2022-04-03 HISTORY — PX: LEFT HEART CATH AND CORONARY ANGIOGRAPHY: CATH118249

## 2022-04-03 LAB — COMPREHENSIVE METABOLIC PANEL
ALT: 60 U/L — ABNORMAL HIGH (ref 0–44)
ALT: 64 U/L — ABNORMAL HIGH (ref 0–44)
AST: 333 U/L — ABNORMAL HIGH (ref 15–41)
AST: 348 U/L — ABNORMAL HIGH (ref 15–41)
Albumin: 3.6 g/dL (ref 3.5–5.0)
Albumin: 3.8 g/dL (ref 3.5–5.0)
Alkaline Phosphatase: 80 U/L (ref 38–126)
Alkaline Phosphatase: 88 U/L (ref 38–126)
Anion gap: 10 (ref 5–15)
Anion gap: 7 (ref 5–15)
BUN: 18 mg/dL (ref 8–23)
BUN: 19 mg/dL (ref 8–23)
CO2: 24 mmol/L (ref 22–32)
CO2: 27 mmol/L (ref 22–32)
Calcium: 9.1 mg/dL (ref 8.9–10.3)
Calcium: 9.3 mg/dL (ref 8.9–10.3)
Chloride: 102 mmol/L (ref 98–111)
Chloride: 99 mmol/L (ref 98–111)
Creatinine, Ser: 0.94 mg/dL (ref 0.44–1.00)
Creatinine, Ser: 0.99 mg/dL (ref 0.44–1.00)
GFR, Estimated: 59 mL/min — ABNORMAL LOW (ref >60)
GFR, Estimated: >60 mL/min (ref >60)
Glucose, Bld: 110 mg/dL — ABNORMAL HIGH (ref 70–99)
Glucose, Bld: 117 mg/dL — ABNORMAL HIGH (ref 70–99)
Potassium: 2.9 mmol/L — ABNORMAL LOW (ref 3.5–5.1)
Potassium: 3 mmol/L — ABNORMAL LOW (ref 3.5–5.1)
Sodium: 133 mmol/L — ABNORMAL LOW (ref 135–145)
Sodium: 136 mmol/L (ref 135–145)
Total Bilirubin: 0.4 mg/dL (ref 0.3–1.2)
Total Bilirubin: 0.6 mg/dL (ref 0.3–1.2)
Total Protein: 7.5 g/dL (ref 6.5–8.1)
Total Protein: 8.1 g/dL (ref 6.5–8.1)

## 2022-04-03 LAB — CBC WITH DIFFERENTIAL/PLATELET
Abs Immature Granulocytes: 0.03 10*3/uL (ref 0.00–0.07)
Abs Immature Granulocytes: 0.04 10*3/uL (ref 0.00–0.07)
Basophils Absolute: 0.1 10*3/uL (ref 0.0–0.1)
Basophils Absolute: 0.1 10*3/uL (ref 0.0–0.1)
Basophils Relative: 0 %
Basophils Relative: 1 %
Eosinophils Absolute: 0.1 10*3/uL (ref 0.0–0.5)
Eosinophils Absolute: 0.1 10*3/uL (ref 0.0–0.5)
Eosinophils Relative: 1 %
Eosinophils Relative: 1 %
HCT: 39.7 % (ref 36.0–46.0)
HCT: 39.8 % (ref 36.0–46.0)
Hemoglobin: 12.7 g/dL (ref 12.0–15.0)
Hemoglobin: 13.2 g/dL (ref 12.0–15.0)
Immature Granulocytes: 0 %
Immature Granulocytes: 0 %
Lymphocytes Relative: 21 %
Lymphocytes Relative: 21 %
Lymphs Abs: 2.3 10*3/uL (ref 0.7–4.0)
Lymphs Abs: 2.5 10*3/uL (ref 0.7–4.0)
MCH: 28.3 pg (ref 26.0–34.0)
MCH: 28.7 pg (ref 26.0–34.0)
MCHC: 32 g/dL (ref 30.0–36.0)
MCHC: 33.2 g/dL (ref 30.0–36.0)
MCV: 86.5 fL (ref 80.0–100.0)
MCV: 88.6 fL (ref 80.0–100.0)
Monocytes Absolute: 0.9 10*3/uL (ref 0.1–1.0)
Monocytes Absolute: 1 10*3/uL (ref 0.1–1.0)
Monocytes Relative: 8 %
Monocytes Relative: 9 %
Neutro Abs: 7.8 10*3/uL — ABNORMAL HIGH (ref 1.7–7.7)
Neutro Abs: 8.2 10*3/uL — ABNORMAL HIGH (ref 1.7–7.7)
Neutrophils Relative %: 69 %
Neutrophils Relative %: 69 %
Platelets: 252 10*3/uL (ref 150–400)
Platelets: 287 10*3/uL (ref 150–400)
RBC: 4.48 MIL/uL (ref 3.87–5.11)
RBC: 4.6 MIL/uL (ref 3.87–5.11)
RDW: 15.7 % — ABNORMAL HIGH (ref 11.5–15.5)
RDW: 15.8 % — ABNORMAL HIGH (ref 11.5–15.5)
WBC: 11.1 10*3/uL — ABNORMAL HIGH (ref 4.0–10.5)
WBC: 11.8 10*3/uL — ABNORMAL HIGH (ref 4.0–10.5)
nRBC: 0 % (ref 0.0–0.2)
nRBC: 0 % (ref 0.0–0.2)

## 2022-04-03 LAB — APTT: aPTT: 29 seconds (ref 24–36)

## 2022-04-03 LAB — TROPONIN I (HIGH SENSITIVITY)
Troponin I (High Sensitivity): >24000 ng/L (ref <18)
Troponin I (High Sensitivity): >24000 ng/L (ref <18)

## 2022-04-03 LAB — POCT ACTIVATED CLOTTING TIME
Activated Clotting Time: 233 seconds
Activated Clotting Time: 363 seconds

## 2022-04-03 LAB — MRSA NEXT GEN BY PCR, NASAL: MRSA by PCR Next Gen: NOT DETECTED

## 2022-04-03 LAB — LIPID PANEL
Cholesterol: 283 mg/dL — ABNORMAL HIGH (ref 0–200)
HDL: 57 mg/dL (ref >40)
LDL Cholesterol: 201 mg/dL — ABNORMAL HIGH (ref 0–99)
Total CHOL/HDL Ratio: 5 RATIO
Triglycerides: 126 mg/dL (ref <150)
VLDL: 25 mg/dL (ref 0–40)

## 2022-04-03 LAB — PROTIME-INR
INR: 1 (ref 0.8–1.2)
Prothrombin Time: 13.3 seconds (ref 11.4–15.2)

## 2022-04-03 LAB — MAGNESIUM
Magnesium: 1.8 mg/dL (ref 1.7–2.4)
Magnesium: 2 mg/dL (ref 1.7–2.4)

## 2022-04-03 LAB — CARDIAC CATHETERIZATION: Cath EF Quantitative: 35 %

## 2022-04-03 LAB — PHOSPHORUS: Phosphorus: 3.3 mg/dL (ref 2.5–4.6)

## 2022-04-03 SURGERY — CORONARY/GRAFT ACUTE MI REVASCULARIZATION
Anesthesia: Moderate Sedation

## 2022-04-03 MED ORDER — HEPARIN SODIUM (PORCINE) 1000 UNIT/ML IJ SOLN
INTRAMUSCULAR | Status: DC | PRN
  Administered 2022-04-03: 4000 [IU] via INTRAVENOUS
  Administered 2022-04-03: 3000 [IU] via INTRAVENOUS

## 2022-04-03 MED ORDER — HEPARIN (PORCINE) IN NACL 1000-0.9 UT/500ML-% IV SOLN
INTRAVENOUS | Status: DC | PRN
  Administered 2022-04-03 (×2): 500 mL

## 2022-04-03 MED ORDER — VERAPAMIL HCL 2.5 MG/ML IV SOLN
INTRAVENOUS | Status: AC
  Filled 2022-04-03: qty 2

## 2022-04-03 MED ORDER — SODIUM CHLORIDE 0.9% FLUSH
3.0000 mL | Freq: Two times a day (BID) | INTRAVENOUS | Status: DC
  Administered 2022-04-04 – 2022-04-05 (×3): 3 mL via INTRAVENOUS

## 2022-04-03 MED ORDER — DM-GUAIFENESIN ER 30-600 MG PO TB12: 1.0000 | ORAL_TABLET | Freq: Two times a day (BID) | ORAL | Status: DC | PRN

## 2022-04-03 MED ORDER — PANTOPRAZOLE SODIUM 40 MG PO TBEC
40.0000 mg | DELAYED_RELEASE_TABLET | Freq: Every day | ORAL | Status: DC
  Administered 2022-04-03 – 2022-04-06 (×4): 40 mg via ORAL
  Filled 2022-04-03 (×4): qty 1

## 2022-04-03 MED ORDER — ORAL CARE MOUTH RINSE: 15.0000 mL | OROMUCOSAL | Status: DC | PRN

## 2022-04-03 MED ORDER — HEPARIN SODIUM (PORCINE) 5000 UNIT/ML IJ SOLN
4000.0000 [IU] | Freq: Once | INTRAMUSCULAR | Status: AC
  Administered 2022-04-03: 4000 [IU] via INTRAVENOUS

## 2022-04-03 MED ORDER — SODIUM CHLORIDE 0.9 % IV SOLN: INTRAVENOUS | Status: AC

## 2022-04-03 MED ORDER — MIDAZOLAM HCL 2 MG/2ML IJ SOLN
INTRAMUSCULAR | Status: AC
  Filled 2022-04-03: qty 2

## 2022-04-03 MED ORDER — LEVOTHYROXINE SODIUM 100 MCG PO TABS
100.0000 ug | ORAL_TABLET | Freq: Every day | ORAL | Status: DC
  Administered 2022-04-04 – 2022-04-06 (×3): 100 ug via ORAL
  Filled 2022-04-03 (×3): qty 1

## 2022-04-03 MED ORDER — ATORVASTATIN CALCIUM 80 MG PO TABS
80.0000 mg | ORAL_TABLET | Freq: Every evening | ORAL | Status: DC
  Administered 2022-04-03 – 2022-04-05 (×3): 80 mg via ORAL
  Filled 2022-04-03: qty 4
  Filled 2022-04-03: qty 1
  Filled 2022-04-03: qty 4

## 2022-04-03 MED ORDER — ONDANSETRON HCL 4 MG/2ML IJ SOLN: 4.0000 mg | Freq: Three times a day (TID) | INTRAMUSCULAR | Status: DC | PRN

## 2022-04-03 MED ORDER — MORPHINE SULFATE (PF) 4 MG/ML IV SOLN: 1.0000 mg | INTRAVENOUS | Status: DC | PRN

## 2022-04-03 MED ORDER — IOHEXOL 300 MG/ML  SOLN
INTRAMUSCULAR | Status: DC | PRN
  Administered 2022-04-03: 220 mL

## 2022-04-03 MED ORDER — VITAMIN D 25 MCG (1000 UNIT) PO TABS
1000.0000 [IU] | ORAL_TABLET | Freq: Every day | ORAL | Status: DC
  Administered 2022-04-04 – 2022-04-06 (×3): 1000 [IU] via ORAL
  Filled 2022-04-03 (×3): qty 1

## 2022-04-03 MED ORDER — VERAPAMIL HCL 2.5 MG/ML IV SOLN
INTRAVENOUS | Status: DC | PRN
  Administered 2022-04-03: 2.5 mg via INTRA_ARTERIAL

## 2022-04-03 MED ORDER — HYDRALAZINE HCL 20 MG/ML IJ SOLN: 5.0000 mg | INTRAMUSCULAR | Status: DC | PRN

## 2022-04-03 MED ORDER — LIDOCAINE HCL (PF) 1 % IJ SOLN
INTRAMUSCULAR | Status: DC | PRN
  Administered 2022-04-03: 2 mL

## 2022-04-03 MED ORDER — NITROGLYCERIN 0.4 MG SL SUBL: 0.4000 mg | SUBLINGUAL_TABLET | SUBLINGUAL | Status: DC | PRN

## 2022-04-03 MED ORDER — ALBUTEROL SULFATE (2.5 MG/3ML) 0.083% IN NEBU: 3.0000 mL | INHALATION_SOLUTION | Freq: Four times a day (QID) | RESPIRATORY_TRACT | Status: DC | PRN

## 2022-04-03 MED ORDER — ONDANSETRON HCL 4 MG/2ML IJ SOLN: 4.0000 mg | Freq: Four times a day (QID) | INTRAMUSCULAR | Status: DC | PRN

## 2022-04-03 MED ORDER — SODIUM CHLORIDE 0.9% FLUSH: 3.0000 mL | INTRAVENOUS | Status: DC | PRN

## 2022-04-03 MED ORDER — HEPARIN SODIUM (PORCINE) 1000 UNIT/ML IJ SOLN
INTRAMUSCULAR | Status: AC
  Filled 2022-04-03: qty 10

## 2022-04-03 MED ORDER — POTASSIUM CHLORIDE CRYS ER 20 MEQ PO TBCR
60.0000 meq | EXTENDED_RELEASE_TABLET | Freq: Once | ORAL | Status: AC
  Administered 2022-04-03: 60 meq via ORAL
  Filled 2022-04-03: qty 3

## 2022-04-03 MED ORDER — ASPIRIN 81 MG PO CHEW
324.0000 mg | CHEWABLE_TABLET | Freq: Once | ORAL | Status: AC
  Administered 2022-04-03: 324 mg via ORAL

## 2022-04-03 MED ORDER — CARVEDILOL 3.125 MG PO TABS
3.1250 mg | ORAL_TABLET | Freq: Two times a day (BID) | ORAL | Status: DC
  Administered 2022-04-03 – 2022-04-06 (×6): 3.125 mg via ORAL
  Filled 2022-04-03 (×6): qty 1

## 2022-04-03 MED ORDER — ASPIRIN 81 MG PO CHEW
81.0000 mg | CHEWABLE_TABLET | Freq: Every day | ORAL | Status: DC
  Administered 2022-04-04 – 2022-04-06 (×3): 81 mg via ORAL
  Filled 2022-04-03 (×3): qty 1

## 2022-04-03 MED ORDER — MIDAZOLAM HCL 2 MG/2ML IJ SOLN
INTRAMUSCULAR | Status: DC | PRN
  Administered 2022-04-03: 1 mg via INTRAVENOUS

## 2022-04-03 MED ORDER — NITROGLYCERIN 1 MG/10 ML FOR IR/CATH LAB
INTRA_ARTERIAL | Status: DC | PRN
  Administered 2022-04-03 (×3): 200 ug via INTRACORONARY

## 2022-04-03 MED ORDER — TICAGRELOR 90 MG PO TABS
ORAL_TABLET | ORAL | Status: DC | PRN
  Administered 2022-04-03: 180 mg via ORAL

## 2022-04-03 MED ORDER — MONTELUKAST SODIUM 10 MG PO TABS
10.0000 mg | ORAL_TABLET | Freq: Every day | ORAL | Status: DC
  Administered 2022-04-03 – 2022-04-05 (×3): 10 mg via ORAL
  Filled 2022-04-03 (×3): qty 1

## 2022-04-03 MED ORDER — TICAGRELOR 90 MG PO TABS
90.0000 mg | ORAL_TABLET | Freq: Two times a day (BID) | ORAL | Status: DC
  Administered 2022-04-03 – 2022-04-06 (×6): 90 mg via ORAL
  Filled 2022-04-03 (×6): qty 1

## 2022-04-03 MED ORDER — FENTANYL CITRATE (PF) 100 MCG/2ML IJ SOLN
INTRAMUSCULAR | Status: AC
  Filled 2022-04-03: qty 2

## 2022-04-03 MED ORDER — NITROGLYCERIN 1 MG/10 ML FOR IR/CATH LAB
INTRA_ARTERIAL | Status: AC
  Filled 2022-04-03: qty 10

## 2022-04-03 MED ORDER — LIDOCAINE HCL 1 % IJ SOLN
INTRAMUSCULAR | Status: AC
  Filled 2022-04-03: qty 20

## 2022-04-03 MED ORDER — ENOXAPARIN SODIUM 40 MG/0.4ML IJ SOSY
40.0000 mg | PREFILLED_SYRINGE | INTRAMUSCULAR | Status: DC
  Administered 2022-04-04 – 2022-04-06 (×3): 40 mg via SUBCUTANEOUS
  Filled 2022-04-03 (×3): qty 0.4

## 2022-04-03 MED ORDER — SODIUM CHLORIDE 0.9 % IV SOLN: 250.0000 mL | INTRAVENOUS | Status: DC | PRN

## 2022-04-03 MED ORDER — HEPARIN (PORCINE) IN NACL 1000-0.9 UT/500ML-% IV SOLN
INTRAVENOUS | Status: AC
  Filled 2022-04-03: qty 500

## 2022-04-03 MED ORDER — TICAGRELOR 90 MG PO TABS
ORAL_TABLET | ORAL | Status: AC
  Filled 2022-04-03: qty 1

## 2022-04-03 MED ORDER — ALBUTEROL SULFATE (2.5 MG/3ML) 0.083% IN NEBU: 3.0000 mL | INHALATION_SOLUTION | RESPIRATORY_TRACT | Status: DC | PRN

## 2022-04-03 MED ORDER — NIACIN 500 MG PO TABS
500.0000 mg | ORAL_TABLET | Freq: Every day | ORAL | Status: DC
  Administered 2022-04-04 – 2022-04-06 (×3): 500 mg via ORAL
  Filled 2022-04-03 (×4): qty 1

## 2022-04-03 MED ORDER — HEPARIN (PORCINE) IN NACL 1000-0.9 UT/500ML-% IV SOLN
INTRAVENOUS | Status: AC
  Filled 2022-04-03: qty 1000

## 2022-04-03 SURGICAL SUPPLY — 23 items
BALLN MINITREK RX 2.0X12 (BALLOONS) ×1
BALLN ~~LOC~~ TREK NEO RX 3.5X15 (BALLOONS) ×1
BALLOON MINITREK RX 2.0X12 (BALLOONS) IMPLANT
BALLOON ~~LOC~~ TREK NEO RX 3.5X15 (BALLOONS) IMPLANT
BAND ZEPHYR COMPRESS 30 LONG (HEMOSTASIS) IMPLANT
CATH DRAGONFLY OPSTAR (CATHETERS) IMPLANT
CATH INFINITI 5FR ANG PIGTAIL (CATHETERS) IMPLANT
CATH INFINITI JR4 5F (CATHETERS) IMPLANT
CATH LAUNCHER 6FR EBU3.5 (CATHETERS) IMPLANT
CATH LAUNCHER 6FR JL3.5 (CATHETERS) IMPLANT
CATH LAUNCHER 6FR JR4 (CATHETERS) IMPLANT
DRAPE BRACHIAL (DRAPES) IMPLANT
GLIDESHEATH SLEND SS 6F .021 (SHEATH) IMPLANT
GUIDEWIRE INQWIRE 1.5J.035X260 (WIRE) IMPLANT
INQWIRE 1.5J .035X260CM (WIRE) ×1
PACK CARDIAC CATH (CUSTOM PROCEDURE TRAY) ×1 IMPLANT
PROTECTION STATION PRESSURIZED (MISCELLANEOUS) ×1
SET ATX SIMPLICITY (MISCELLANEOUS) IMPLANT
STATION PROTECTION PRESSURIZED (MISCELLANEOUS) IMPLANT
STENT ONYX FRONTIER 2.25X38 (Permanent Stent) IMPLANT
STENT ONYX FRONTIER 3.0X38 (Permanent Stent) IMPLANT
TUBING CIL FLEX 10 FLL-RA (TUBING) IMPLANT
WIRE RUNTHROUGH .014X180CM (WIRE) IMPLANT

## 2022-04-03 NOTE — ED Triage Notes (Signed)
Pt to ED from [facility] for code stemi. Right arm pain started yesterday. Now c/o centralized cp radiating to back 20 to left AC. 324 asa PTA 3 nitro PTA Dr Charna Archer bedside

## 2022-04-03 NOTE — ED Notes (Signed)
Cardiologist at bedside.  

## 2022-04-03 NOTE — Consult Note (Signed)
Cardiology Consultation   Patient ID: Ryna Beckstrom MRN: [id number]; DOB: February 26, 1946  Admit date: 04/03/2022 Date of Consult: 04/03/2022  PCP:  Lynnell Jude, MD   [facility] Prov[facility] Cardiologist:  new Fletcher Anon)   Patient Profile:   Daveah Varone is a 76 y.o. female with a hx of essential hypertension, GERD, hypothyroidism and scoliosis who is being seen 04/03/2022 for the evaluation of anterior STEMI at the request of Dr. Charna Archer.  History of Present Illness:   Ms. Recktenwald is a 76 year old female with no prior cardiac history.  She started having substernal chest pain and tightness radiating to both arms yesterday.  The pain was moderate in intensity overall and she did not seek medical attention right away.  She had some shortness of breath this morning but no dizziness, nausea or vomiting.  Due to persistent symptoms, she decided to go to urgent care today where an EKG was done and showed anterior ST elevation and thus EMS were called for transfer and a code STEMI was activated by EMS. I recommended proceeding with emergent cardiac catheterization even though there was an element of late presentation.   Past Medical History:  Diagnosis Date   Asthma    GERD (gastroesophageal reflux disease)    Glaucoma    Hypertension    Hypothyroidism    Scoliosis    Seasonal allergies    Skin cancer    Vertigo    Avg: 1x/mo    Past Surgical History:  Procedure Laterality Date   CATARACT EXTRACTION W/PHACO Right 03/13/2022   Procedure: CATARACT EXTRACTION PHACO AND INTRAOCULAR LENS PLACEMENT (IOC) RIGHT WITH HYDRUS MICROSTENT  6.72  00:37.7;  Surgeon: Eulogio Bear, MD;  Location: [facility];  Service: Ophthalmology;  Laterality: Right;   CATARACT EXTRACTION W/PHACO Left 03/27/2022   Procedure: CATARACT EXTRACTION PHACO AND INTRAOCULAR LENS PLACEMENT (IOC) LEFT WITH IOL PLUS HYDRUS MICROSTENT 5.21 00:30.9;  Surgeon: Eulogio Bear, MD;   Location: [facility];  Service: Ophthalmology;  Laterality: Left;   COLONOSCOPY     COLONOSCOPY WITH PROPOFOL N/A 03/12/2018   Procedure: COLONOSCOPY WITH PROPOFOL;  Surgeon: Virgel Manifold, MD;  Location: [facility];  Service: Endoscopy;  Laterality: N/A;   ORIF TIBIA & FIBULA FRACTURES     TONSILLECTOMY       Home Medications:  Prior to Admission medications   Medication Sig Start Date End Date Taking? Authorizing Provider  albuterol (PROVENTIL HFA;VENTOLIN HFA) 108 (90 Base) MCG/ACT inhaler Inhale into the lungs every 6 (six) hours as needed for wheezing or shortness of breath.   Yes [provider]  cholecalciferol 25 MCG (1000 UT) TABS Take by mouth daily.   Yes [provider]  dextromethorphan-guaiFENesin (ROBITUSSIN-DM) 10-100 MG/5ML liquid Take 5 mLs by mouth every 4 (four) hours as needed for cough.   Yes [provider]  fluticasone (FLOVENT HFA) 220 MCG/ACT inhaler Inhale into the lungs 2 (two) times daily as needed.   Yes [provider]  lansoprazole (PREVACID) 15 MG capsule Take 15 mg by mouth daily at 12 noon.   Yes [provider]  levothyroxine (SYNTHROID) 100 MCG tablet Take 100 mcg by mouth daily. 03/16/22  Yes [provider]  montelukast (SINGULAIR) 10 MG tablet Take 10 mg by mouth at bedtime.   Yes [provider]  niacinamide 500 MG tablet Take 500 mg by mouth daily.   Yes [provider]  triamterene-hydrochlorothiazide (MAXZIDE-25) 37.5-25 MG tablet Take 1  tablet by mouth daily.   Yes [provider]  Turmeric Curcumin 500 MG CAPS Take by mouth daily.   Yes [provider]    Inpatient Medications: Scheduled Meds:  [START ON 04/04/2022] aspirin  81 mg Oral Daily   atorvastatin  80 mg Oral QPM   carvedilol  3.125 mg Oral BID WC   cholecalciferol  1,000 Units Oral Daily   [START ON 04/04/2022] enoxaparin (LOVENOX) injection  40 mg Subcutaneous Q24H    [START ON 04/04/2022] levothyroxine  100 mcg Oral Q0600   montelukast  10 mg Oral QHS   niacin  500 mg Oral Daily   pantoprazole  40 mg Oral Daily   sodium chloride flush  3 mL Intravenous Q12H   ticagrelor  90 mg Oral BID   Continuous Infusions:  sodium chloride     PRN Meds: sodium chloride, albuterol, dextromethorphan-guaiFENesin, hydrALAZINE, morphine injection, nitroGLYCERIN, ondansetron (ZOFRAN) IV, mouth rinse, sodium chloride flush  Allergies:    Allergies  Allergen Reactions   Sunflower Oil Anaphylaxis and Itching   Bactrim [Sulfamethoxazole-Trimethoprim] Itching   Pineapple Itching    throat   Tetracycline Itching         Social History:   Social History   Socioeconomic History   Marital status: Widowed    Spouse name: Not on file   Number of children: Not on file   Years of education: Not on file   Highest education level: Not on file  Occupational History   Not on file  Tobacco Use   Smoking status: Former    Types: Cigarettes    Quit date: 1999    Years since quitting: 24.9   Smokeless tobacco: Never   Tobacco comments:    has had coccasional cig since quitting  Vaping Use   Vaping Use: Never used  Substance and Sexual Activity   Alcohol use: Yes    Comment: couple x/yr   Drug use: Never   Sexual activity: Not on file  Other Topics Concern   Not on file  Social History Narrative   Not on file   Social Determinants of Health   Financial Resource Strain: Not on file  Food Insecurity: No Food Insecurity (04/03/2022)   Hunger Vital Sign    Worried About Running Out of Food in the Last Year: Never true    Ran Out of Food in the Last Year: Never true  Transportation Needs: No Transportation Needs (04/03/2022)   PRAPARE - Hydrologist (Medical): No    Lack of Transportation (Non-Medical): No  Physical Activity: Not on file  Stress: Not on file  Social Connections: Not on file  Intimate Partner Violence: Not At Risk  (04/03/2022)   Humiliation, Afraid, Rape, and Kick questionnaire    Fear of Current or Ex-Partner: No    Emotionally Abused: No    Physically Abused: No    Sexually Abused: No    Family History:    Family History  Problem Relation Age of Onset   Colon polyps Brother      ROS:  Please see the history of present illness.   All other ROS reviewed and negative.     Physical Exam/Data:   Vitals:   04/03/22 1630 04/03/22 1700 04/03/22 1730 04/03/22 1800  BP: (!) 144/71 133/76 131/60 100/88  Pulse:   62 62  Resp: 18 (!) 24 (!) 25 20  Temp:      TempSrc:      SpO2: 98%  98% 98% 100%  Weight:      Height:        Intake/Output Summary (Last 24 hours) at 04/03/2022 2011 Last data filed at 04/03/2022 1724 Gross per 24 hour  Intake 220 ml  Output --  Net 220 ml      04/03/2022   12:03 PM 03/27/2022   12:08 PM 03/16/2022    4:40 PM  Last 3 Weights  Weight (lbs) 163 lb 5.8 oz 159 lb 11.2 oz 161 lb  Weight (kg) 74.1 kg 72.439 kg 73.029 kg     Body mass index is 28.94 kg/m.  General:  Well nourished, well developed, in no acute distress HEENT: normal Neck: no JVD Vascular: No carotid bruits; Distal pulses 2+ bilaterally Cardiac:  normal S1, S2; RRR; 1/6 systolic murmur in the aortic area Lungs:  clear to auscultation bilaterally, no wheezing, rhonchi or rales  Abd: soft, nontender, no hepatomegaly  Ext: no edema Musculoskeletal:  No deformities, BUE and BLE strength normal and equal Skin: warm and dry  Neuro:  CNs 2-12 intact, no focal abnormalities noted Psych:  Normal affect   EKG:  The EKG was personally reviewed and demonstrates: Normal sinus rhythm with 1 to 2 mm of anterior ST elevation with deeply inverted T waves.  Some minor inferior ST elevation as well. Telemetry:  Telemetry was personally reviewed and demonstrates:   Relevant CV Studies:   Laboratory Data:  High Sensitivity Troponin:   Recent Labs  Lab 04/03/22 0930 04/03/22 1003  TROPONINIHS  >24,000* >24,000*     Chemistry Recent Labs  Lab 04/03/22 0930 04/03/22 1003 04/03/22 1224  NA 133* 136  --   K 2.9* 3.0*  --   CL 99 102  --   CO2 24 27  --   GLUCOSE 110* 117*  --   BUN 19 18  --   CREATININE 0.94 0.99  --   CALCIUM 9.1 9.3  --   MG 1.8  --  2.0  GFRNONAA >60 59*  --   ANIONGAP 10 7  --     Recent Labs  Lab 04/03/22 0930 04/03/22 1003  PROT 8.1 7.5  ALBUMIN 3.8 3.6  AST 348* 333*  ALT 64* 60*  ALKPHOS 88 80  BILITOT 0.4 0.6   Lipids  Recent Labs  Lab 04/03/22 1003  CHOL 283*  TRIG 126  HDL 57  LDLCALC 201*  CHOLHDL 5.0    Hematology Recent Labs  Lab 04/03/22 0930 04/03/22 1003  WBC 11.1* 11.8*  RBC 4.60 4.48  HGB 13.2 12.7  HCT 39.8 39.7  MCV 86.5 88.6  MCH 28.7 28.3  MCHC 33.2 32.0  RDW 15.8* 15.7*  PLT 252 287   Thyroid No results for input(s): "TSH", "FREET4" in the last 168 hours.  BNPNo results for input(s): "BNP", "PROBNP" in the last 168 hours.  DDimer No results for input(s): "DDIMER" in the last 168 hours.   Radiology/Studies:  DG Chest Port 1 View  Result Date: 04/03/2022 CLINICAL DATA:  Intermittent chest pain EXAM: PORTABLE CHEST 1 VIEW COMPARISON:  None Available. FINDINGS: Pronounced scoliosis convex to the right. Heart size upper limits of normal. Atherosclerotic calcification of the aorta is visible. The lungs are clear. The vascularity is normal. No effusions. IMPRESSION: No active disease. Pronounced scoliosis convex to the right. Aortic atherosclerosis. Electronically Signed   By: Nelson Chimes M.D.   On: 04/03/2022 13:57   CARDIAC CATHETERIZATION  Result Date: 04/03/2022   2nd Diag lesion is 60%  stenosed.   Mid LAD to Dist LAD lesion is 100% stenosed.   Mid LAD lesion is 40% stenosed.   Dist LAD lesion is 60% stenosed.   Prox RCA lesion is 60% stenosed.   Prox RCA to Mid RCA lesion is 95% stenosed.   A drug-eluting stent was successfully placed using a STENT ONYX FRONTIER 2.25X38.   A drug-eluting stent was  successfully placed using a STENT ONYX FRONTIER 3.0X38.   Post intervention, there is a 0% residual stenosis.   Post intervention, there is a 0% residual stenosis.   Post intervention, there is a 0% residual stenosis.   There is moderate left ventricular systolic dysfunction.   LV end diastolic pressure is moderately elevated. 1.  Anterior ST elevation myocardial infarction due to an occluded mid LAD.  In addition, there is severe stenosis in the proximal right coronary artery. 2.  Moderately reduced LV systolic function with an EF of 35% with akinesis of the mid to distal anterior wall, apex and distal inferior wall.  Moderately elevated left ventricular end-diastolic pressure with an LVEDP of 27 mmHg. 3.  Successful angioplasty and drug-eluting stent placement to the mid LAD. 4.  Successful OCT guided PCI and drug-eluting stent placement to the right coronary artery. Recommendations: Dual antiplatelet therapy for at least 12 months. Aggressive treatment of risk factors. I started small dose carvedilol.    Assessment and Plan:   Anterior ST elevation myocardial infarction with late presentation: Emergent cardiac catheterization showed occluded mid LAD which was treated successfully with PCI and drug-eluting stent placement.  In addition, there was severe stenosis in the proximal right coronary artery which was also treated with drug-eluting stent placement.  Recommend continuing dual antiplatelet therapy with aspirin and ticagrelor for at least 1 year.  Recommend aggressive treatment of risk factors. Acute systolic heart failure due to ischemic cardiomyopathy: LVEDP was 27 mmHg.  Diuresis as needed.  She is currently not hypoxic.  I started small dose carvedilol.  Start small dose ARB and likely spironolactone once blood pressure allows. Elevated liver enzymes: Mainly AST and this is likely due to her myocardial infarction. Hyperlipidemia: Recommend high-dose atorvastatin given her LDL of 201.   Risk  Assessment/Risk Scores:     TIMI Risk Score for ST  Elevation MI:   The patient's TIMI risk score is  , which indicates a  % risk of all cause mortality at 30 days.{   For questions or updates, please contact [facility] Please consult [email or web address] for contact info under    Signed, Kathlyn Sacramento, MD  04/03/2022 8:11 PM

## 2022-04-03 NOTE — Discharge Instructions (Addendum)
-  Given results of EKG that is concerning for an acute MI, EMS has been called to transport to the hospital -Aspirin has been given to start treatment and labs have been drawn

## 2022-04-03 NOTE — Progress Notes (Signed)
Dr. Fletcher Anon notified that when TR band deflated per protocol patient had some bleeding. Re-inflated and will re-attempt per orders. Patients blood pressure 105/82. Per Dr. Fletcher Anon give coreg, hold if systolic less then 737. Patient stated that she will not take her statin medication until she can get coq 10. Per pharmacy that is a herb that we do not have. Daughter will bring in tomorrow to have pharmacy verify and dispense. Continue to assess.

## 2022-04-03 NOTE — ED Provider Notes (Signed)
[facility]    CSN: [id number] Arrival date & time: 04/03/22  0848      History   Chief Complaint Chief Complaint  Patient presents with   Chest Pain   Dizziness   Shortness of Breath    HPI Lisa Hale is a 76 y.o. female.   Patient is a 76 year old female who presents with complaint of chest pressure.  Patient states she had a hot flash yesterday morning about 1030 followed by about 1 hour of dizziness and respiration.  Again that resolved roughly hour.  Since then she has not gradual increase in chest pressure that she states feels like a 5 to an elephant sitting on her chest.  Patient also reports some shortness of breath and dizziness.  Some radiation of pain to the back.  Patient did have cataract surgery on 1 eye on November 13 and the other eye done last week on 11/27.  Patient right some right arm pain due to blood pressure being taken in the PACU last week.  She states she has been taking ibuprofen for the pain patient denies any cardiac history.  Patient also denies any recent illnesses.       Past Medical History:  Diagnosis Date   Asthma    GERD (gastroesophageal reflux disease)    Glaucoma    Hypertension    Hypothyroidism    Scoliosis    Seasonal allergies    Skin cancer    Vertigo    Avg: 1x/mo    Patient Active Problem List   Diagnosis Date Noted   History of colonic polyps    Hemorrhoids without complication    Diverticulosis of large intestine without diverticulitis    ASTHMA 06/17/2009   SCOLIOSIS 06/17/2009   COUGH 06/17/2009    Past Surgical History:  Procedure Laterality Date   CATARACT EXTRACTION W/PHACO Right 03/13/2022   Procedure: CATARACT EXTRACTION PHACO AND INTRAOCULAR LENS PLACEMENT (IOC) RIGHT WITH HYDRUS MICROSTENT  6.72  00:37.7;  Surgeon: Eulogio Bear, MD;  Location: [facility];  Service: Ophthalmology;  Laterality: Right;   CATARACT EXTRACTION W/PHACO Left 03/27/2022   Procedure:  CATARACT EXTRACTION PHACO AND INTRAOCULAR LENS PLACEMENT (IOC) LEFT WITH IOL PLUS HYDRUS MICROSTENT 5.21 00:30.9;  Surgeon: Eulogio Bear, MD;  Location: [facility];  Service: Ophthalmology;  Laterality: Left;   COLONOSCOPY     COLONOSCOPY WITH PROPOFOL N/A 03/12/2018   Procedure: COLONOSCOPY WITH PROPOFOL;  Surgeon: Virgel Manifold, MD;  Location: [facility];  Service: Endoscopy;  Laterality: N/A;   ORIF TIBIA & FIBULA FRACTURES     TONSILLECTOMY      OB History   No obstetric history on file.      Home Medications    Prior to Admission medications   Medication Sig Start Date End Date Taking? Authorizing Provider  albuterol (PROVENTIL HFA;VENTOLIN HFA) 108 (90 Base) MCG/ACT inhaler Inhale into the lungs every 6 (six) hours as needed for wheezing or shortness of breath.    [provider]  cholecalciferol 25 MCG (1000 UT) TABS Take by mouth daily.    [provider]  dextromethorphan-guaiFENesin (ROBITUSSIN-DM) 10-100 MG/5ML liquid Take 5 mLs by mouth every 4 (four) hours as needed for cough.    [provider]  fluticasone (FLOVENT HFA) 220 MCG/ACT inhaler Inhale into the lungs 2 (two) times daily as needed.    [provider]  lansoprazole (PREVACID) 15 MG capsule Take 15 mg by mouth daily at 12 noon.  [provider]  levothyroxine (SYNTHROID, LEVOTHROID) 200 MCG tablet Take 200 mcg by mouth daily before breakfast.    [provider]  montelukast (SINGULAIR) 10 MG tablet Take 10 mg by mouth at bedtime.    [provider]  niacinamide 500 MG tablet Take 500 mg by mouth daily.    [provider]  Tafluprost (ZIOPTAN OP) Place 1 drop into both eyes at bedtime.    [provider]  triamterene-hydrochlorothiazide (MAXZIDE-25) 37.5-25 MG tablet Take 1 tablet by mouth daily.    [provider]  Turmeric Curcumin 500 MG CAPS Take by mouth daily.    [provider]     Family History History reviewed. No pertinent family history.  Social History Social History   Tobacco Use   Smoking status: Former    Types: Cigarettes    Quit date: 1999    Years since quitting: 24.9   Smokeless tobacco: Never   Tobacco comments:    has had coccasional cig since quitting  Vaping Use   Vaping Use: Never used  Substance Use Topics   Alcohol use: Yes    Comment: couple x/yr     Allergies   Sunflower oil, Bactrim [sulfamethoxazole-trimethoprim], Pineapple, and Tetracycline   Review of Systems Review of Systems as noted above in HPI.  Other systems reviewed and found to be negative   Physical Exam Triage Vital Signs ED Triage Vitals [04/03/22 0855]  Enc Vitals Group     BP (!) 163/94     Pulse Rate 70     Resp 18     Temp      Temp Source Oral     SpO2 100 %     Weight      Height      Head Circumference      Peak Flow      Pain Score 7     Pain Loc      Pain Edu?      Excl. in [place]?    No data found.  Updated Vital Signs BP (!) 163/94 (BP Location: Left Arm)   Pulse 70   Resp 18   SpO2 100%   Visual Acuity Right Eye Distance:   Left Eye Distance:   Bilateral Distance:    Right Eye Near:   Left Eye Near:    Bilateral Near:     Physical Exam Constitutional:      Appearance: She is well-developed.  Cardiovascular:     Rate and Rhythm: Normal rate and regular rhythm.     Heart sounds: Normal heart sounds.  Pulmonary:     Effort: Pulmonary effort is normal.     Breath sounds: Normal breath sounds.  Skin:    General: Skin is warm and dry.  Neurological:     General: No focal deficit present.     Mental Status: She is alert and oriented to person, place, and time.      UC Treatments / Results  Labs (all labs ordered are listed, but only abnormal results are displayed) Labs Reviewed  CBC WITH DIFFERENTIAL/PLATELET - Abnormal; Notable for the following components:      Result Value   WBC 11.1 (*)    RDW 15.8 (*)     Neutro Abs 7.8 (*)    All other components within normal limits  COMPREHENSIVE METABOLIC PANEL  MAGNESIUM  PHOSPHORUS  TROPONIN I (HIGH SENSITIVITY)    EKG Heart rate 63.  Prolonged QTc of 495 ST elevations with  depressed T waves in 2 3 aVF as well as V2 through V5.  Less than 1 minute elevation in V6 Machine reads normal sinus rhythm with right superior axis deviation.  Possible right ventricular hypertrophy.  ST elevation considering inferior and anterior injuries ** ACUTE MI ** Radiology No results found.  Procedures Procedures (including critical care time)  Medications Ordered in UC Medications  aspirin chewable tablet 324 mg (324 mg Oral Given 04/03/22 0937)    Initial Impression / Assessment and Plan / UC Course  I have reviewed the triage vital signs and the nursing notes.  Pertinent labs & imaging results that were available during my care of the patient were reviewed by me and considered in my medical decision making (see chart for details).    Patient presents with central chest pressure that is Building since yesterday after initial episode of dizziness and perspiration.  States it feels like a 5 ton elephant sitting on her chest.  Some dizziness and shortness of breath.  EKG showing ST elevation in inferior, anterior, and lateral leads.  No history of cardiac disease.  No recent illnesses to suggest endocarditis at this point.  #1 call for emergency transfer.  IV started in the left [facility].  Labs drawn including a high-sensitivity troponin.  Report given off to EMS.  [place] charge nurse notified of possible STEMI coming to other location. Final Clinical Impressions(s) / UC Diagnoses   Final diagnoses:  Chest pain, unspecified type  ST elevation myocardial infarction (STEMI), unspecified artery [facility])     Discharge Instructions      -Given results of EKG that is concerning for an acute MI, EMS has been called to transport to the hospital -Aspirin has been given to  start treatment and labs have been drawn     ED Prescriptions   None    PDMP not reviewed this encounter.   Luvenia Redden, PA-C 04/03/22 [phone]

## 2022-04-03 NOTE — ED Triage Notes (Signed)
Patient presents to [facility] for chest pain. SOB, dizziness, and right arm x 1 day. States pain is located all over chest area that radiates to back. Pt had cataract surgery a week ago. States they used a small BP cuff in PACU and since has had arm pain. Pt states she had ibuprofen for pain.

## 2022-04-03 NOTE — H&P (Signed)
History and Physical    Lisa Hale WER:[id number] DOB: [birth year] DOA: 04/03/2022  Referring MD/NP/PA:   PCP: Lynnell Jude, MD   Patient coming from:  The patient is coming from home.    Chief Complaint: chest pressure  HPI: Lisa Hale is a 76 y.o. female with medical history significant of hypertension, asthma, GERD, hypothyroidism, scoliosis, vertigo, glaucoma, who presents with chest pressure.  Patient states that she started having discomfort in bilateral upper arms since yesterday, it has been continued.  Since this morning she developed chest pressure, which is mild to moderate, radiating to the back, denies active chest pain.  No shortness breath, cough.  No nausea, vomiting, diarrhea or abdominal pain.  No symptoms of UTI.  Patient was initially seen in urgent care, found to have ST elevation, sent to ED for further evaluation and treatment.    Dr. Fletcher Anon of cardiology is consulted, who did urgent cardiac cath with two stent placement.  2nd Diag lesion is 60% stenosed.   Mid LAD to Dist LAD lesion is 100% stenosed.   Mid LAD lesion is 40% stenosed.   Dist LAD lesion is 60% stenosed.   Prox RCA lesion is 60% stenosed.   Prox RCA to Mid RCA lesion is 95% stenosed.   A drug-eluting stent was successfully placed using a STENT ONYX FRONTIER 2.25X38.   A drug-eluting stent was successfully placed using a STENT ONYX FRONTIER 3.0X38.   Post intervention, there is a 0% residual stenosis.   Post intervention, there is a 0% residual stenosis.   Post intervention, there is a 0% residual stenosis.   There is moderate left ventricular systolic dysfunction.   LV end diastolic pressure is moderately elevated.   1.  Anterior ST elevation myocardial infarction due to an occluded mid LAD.  In addition, there is severe stenosis in the proximal right coronary artery. 2.  Moderately reduced LV systolic function with an EF of 35% with akinesis of the mid to distal anterior  wall, apex and distal inferior wall.  Moderately elevated left ventricular end-diastolic pressure with an LVEDP of 27 mmHg. 3.  Successful angioplasty and drug-eluting stent placement to the mid LAD. 4.  Successful OCT guided PCI and drug-eluting stent placement to the right coronary artery.   Recommendations: Dual antiplatelet therapy for at least 12 months. Aggressive treatment of risk factors. I started small dose carvedilol.    Data reviewed independently and ED Course: pt was found to have trop> 24000, WBC 11.8, INR 1.0, PTT 29, creatinine 0.99, BUN 18, potassium 3.0, abnormal liver function (ALP 80, AST 333, ALT 60, total bilirubin 0.  6).  Temperature normal, blood pressure 130/65, heart rate 74, RR 18, oxygen saturation 98% on room air.  Chest x-ray negative.  Pt is admitted to stepdown as inpatient.   EKG: I have personally reviewed.  Sinus rhythm, QTc 495, ST elevation and T wave inversion in inferior and precordial leads.  Poor R wave progression   Review of Systems:   General: no fevers, chills, no body weight gain, has fatigue HEENT: no blurry vision, hearing changes or sore throat Respiratory: no dyspnea, coughing, wheezing CV: chest pressure, no palpitations GI: no nausea, vomiting, abdominal pain, diarrhea, constipation GU: no dysuria, burning on urination, increased urinary frequency, hematuria  Ext: no leg edema Neuro: no unilateral weakness, numbness, or tingling, no vision change or hearing loss Skin: no rash, no skin tear. MSK: No muscle spasm, no deformity, no limitation of range of movement  in spin Heme: No easy bruising.  Travel history: No recent long distant travel.   Allergy:  Allergies  Allergen Reactions   Sunflower Oil Anaphylaxis and Itching   Bactrim [Sulfamethoxazole-Trimethoprim] Itching   Pineapple Itching    throat   Tetracycline Itching         Past Medical History:  Diagnosis Date   Asthma    GERD (gastroesophageal reflux disease)     Glaucoma    Hypertension    Hypothyroidism    Scoliosis    Seasonal allergies    Skin cancer    Vertigo    Avg: 1x/mo    Past Surgical History:  Procedure Laterality Date   CATARACT EXTRACTION W/PHACO Right 03/13/2022   Procedure: CATARACT EXTRACTION PHACO AND INTRAOCULAR LENS PLACEMENT (IOC) RIGHT WITH HYDRUS MICROSTENT  6.72  00:37.7;  Surgeon: Eulogio Bear, MD;  Location: [place] [facility];  Service: Ophthalmology;  Laterality: Right;   CATARACT EXTRACTION W/PHACO Left 03/27/2022   Procedure: CATARACT EXTRACTION PHACO AND INTRAOCULAR LENS PLACEMENT (IOC) LEFT WITH IOL PLUS HYDRUS MICROSTENT 5.21 00:30.9;  Surgeon: Eulogio Bear, MD;  Location: [place] [facility];  Service: Ophthalmology;  Laterality: Left;   COLONOSCOPY     COLONOSCOPY WITH PROPOFOL N/A 03/12/2018   Procedure: COLONOSCOPY WITH PROPOFOL;  Surgeon: Virgel Manifold, MD;  Location: [facility] [place];  Service: Endoscopy;  Laterality: N/A;   ORIF TIBIA & FIBULA FRACTURES     TONSILLECTOMY      Social History:  reports that she quit smoking about 24 years ago. Her smoking use included cigarettes. She has never used smokeless tobacco. She reports current alcohol use. She reports that she does not use drugs.  Family History:  Family History  Problem Relation Age of Onset   Colon polyps Brother      Prior to Admission medications   Medication Sig Start Date End Date Taking? Authorizing Provider  levothyroxine (SYNTHROID) 100 MCG tablet Take 100 mcg by mouth daily. 03/16/22  Yes [provider]  albuterol (PROVENTIL HFA;VENTOLIN HFA) 108 (90 Base) MCG/ACT inhaler Inhale into the lungs every 6 (six) hours as needed for wheezing or shortness of breath.    [provider]  cholecalciferol 25 MCG (1000 UT) TABS Take by mouth daily.    [provider]  dextromethorphan-guaiFENesin (ROBITUSSIN-DM) 10-100 MG/5ML liquid Take 5 mLs by mouth every 4 (four) hours as needed for  cough.    [provider]  fluticasone (FLOVENT HFA) 220 MCG/ACT inhaler Inhale into the lungs 2 (two) times daily as needed.    [provider]  lansoprazole (PREVACID) 15 MG capsule Take 15 mg by mouth daily at 12 noon.    [provider]  levothyroxine (SYNTHROID, LEVOTHROID) 200 MCG tablet Take 200 mcg by mouth daily before breakfast.    [provider]  montelukast (SINGULAIR) 10 MG tablet Take 10 mg by mouth at bedtime.    [provider]  niacinamide 500 MG tablet Take 500 mg by mouth daily.    [provider]  Tafluprost (ZIOPTAN OP) Place 1 drop into both eyes at bedtime.    [provider]  triamterene-hydrochlorothiazide (MAXZIDE-25) 37.5-25 MG tablet Take 1 tablet by mouth daily.    [provider]  Turmeric Curcumin 500 MG CAPS Take by mouth daily.    [provider]    Physical Exam: Vitals:   04/03/22 1330 04/03/22 1400 04/03/22 1430 04/03/22 1500  BP: 134/73 (!) 140/62 99/87 129/74  Pulse: (!) 52 (!)  58 61 63  Resp: 17 (!) '23 18 20  '$ Temp:      TempSrc:      SpO2: (!) 72% (!) 84% 96% 96%  Weight:      Height:       General: Not in acute distress HEENT:       Eyes: PERRL, EOMI, no scleral icterus.       ENT: No discharge from the ears and nose, no pharynx injection, no tonsillar enlargement.        Neck: No JVD, no bruit, no mass felt. Heme: No neck lymph node enlargement. Cardiac: S1/S2, RRR, No murmurs, No gallops or rubs. Respiratory: No rales, wheezing, rhonchi or rubs. GI: Soft, nondistended, nontender, no rebound pain, no organomegaly, BS present. GU: No hematuria Ext: No pitting leg edema bilaterally. 1+DP/PT pulse bilaterally. Musculoskeletal: No joint deformities, No joint redness or warmth, no limitation of ROM in spin. Skin: No rashes.  Neuro: Alert, oriented X3, cranial nerves II-XII grossly intact, moves all extremities normally.  Psych: Patient is not psychotic, no  suicidal or hemocidal ideation.  Labs on Admission: I have personally reviewed following labs and imaging studies  CBC: Recent Labs  Lab 04/03/22 0930 04/03/22 1003  WBC 11.1* 11.8*  NEUTROABS 7.8* 8.2*  HGB 13.2 12.7  HCT 39.8 39.7  MCV 86.5 88.6  PLT 252 629   Basic Metabolic Panel: Recent Labs  Lab 04/03/22 0930 04/03/22 1003 04/03/22 1224  NA 133* 136  --   K 2.9* 3.0*  --   CL 99 102  --   CO2 24 27  --   GLUCOSE 110* 117*  --   BUN 19 18  --   CREATININE 0.94 0.99  --   CALCIUM 9.1 9.3  --   MG 1.8  --  2.0  PHOS 3.3  --   --    GFR: Estimated Creatinine Clearance: 45.9 mL/min (by C-G formula based on SCr of 0.99 mg/dL). Liver Function Tests: Recent Labs  Lab 04/03/22 0930 04/03/22 1003  AST 348* 333*  ALT 64* 60*  ALKPHOS 88 80  BILITOT 0.4 0.6  PROT 8.1 7.5  ALBUMIN 3.8 3.6   No results for input(s): "LIPASE", "AMYLASE" in the last 168 hours. No results for input(s): "AMMONIA" in the last 168 hours. Coagulation Profile: Recent Labs  Lab 04/03/22 1003  INR 1.0   Cardiac Enzymes: No results for input(s): "CKTOTAL", "CKMB", "CKMBINDEX", "TROPONINI" in the last 168 hours. BNP (last 3 results) No results for input(s): "PROBNP" in the last 8760 hours. HbA1C: No results for input(s): "HGBA1C" in the last 72 hours. CBG: No results for input(s): "GLUCAP" in the last 168 hours. Lipid Profile: Recent Labs    04/03/22 1003  CHOL 283*  HDL 57  LDLCALC 201*  TRIG 126  CHOLHDL 5.0   Thyroid Function Tests: No results for input(s): "TSH", "T4TOTAL", "FREET4", "T3FREE", "THYROIDAB" in the last 72 hours. Anemia Panel: No results for input(s): "VITAMINB12", "FOLATE", "FERRITIN", "TIBC", "IRON", "RETICCTPCT" in the last 72 hours. Urine analysis: No results found for: "COLORURINE", "APPEARANCEUR", "LABSPEC", "PHURINE", "GLUCOSEU", "HGBUR", "BILIRUBINUR", "KETONESUR", "PROTEINUR", "UROBILINOGEN", "NITRITE", "LEUKOCYTESUR" Sepsis  Labs: '@LABRCNTIP'$ (procalcitonin:4,lacticidven:4) )No results found for this or any previous visit (from the past 240 hour(s)).   Radiological Exams on Admission: DG Chest Port 1 View  Result Date: 04/03/2022 CLINICAL DATA:  Intermittent chest pain EXAM: PORTABLE CHEST 1 VIEW COMPARISON:  None Available. FINDINGS: Pronounced scoliosis convex to the right. Heart size upper limits of normal. Atherosclerotic calcification  of the aorta is visible. The lungs are clear. The vascularity is normal. No effusions. IMPRESSION: No active disease. Pronounced scoliosis convex to the right. Aortic atherosclerosis. Electronically Signed   By: Nelson Chimes M.D.   On: 04/03/2022 13:57   CARDIAC CATHETERIZATION  Result Date: 04/03/2022   2nd Diag lesion is 60% stenosed.   Mid LAD to Dist LAD lesion is 100% stenosed.   Mid LAD lesion is 40% stenosed.   Dist LAD lesion is 60% stenosed.   Prox RCA lesion is 60% stenosed.   Prox RCA to Mid RCA lesion is 95% stenosed.   A drug-eluting stent was successfully placed using a STENT ONYX FRONTIER 2.25X38.   A drug-eluting stent was successfully placed using a STENT ONYX FRONTIER 3.0X38.   Post intervention, there is a 0% residual stenosis.   Post intervention, there is a 0% residual stenosis.   Post intervention, there is a 0% residual stenosis.   There is moderate left ventricular systolic dysfunction.   LV end diastolic pressure is moderately elevated. 1.  Anterior ST elevation myocardial infarction due to an occluded mid LAD.  In addition, there is severe stenosis in the proximal right coronary artery. 2.  Moderately reduced LV systolic function with an EF of 35% with akinesis of the mid to distal anterior wall, apex and distal inferior wall.  Moderately elevated left ventricular end-diastolic pressure with an LVEDP of 27 mmHg. 3.  Successful angioplasty and drug-eluting stent placement to the mid LAD. 4.  Successful OCT guided PCI and drug-eluting stent placement to the right  coronary artery. Recommendations: Dual antiplatelet therapy for at least 12 months. Aggressive treatment of risk factors. I started small dose carvedilol.     Assessment/Plan Principal Problem:   Acute ST elevation myocardial infarction (STEMI) due to occlusion of left anterior descending (LAD) coronary artery (HCC) Active Problems:   HTN (hypertension)   Hypothyroidism   Hypokalemia   Asthma   Leukocytosis   Abnormal LFTs   Assessment and Plan:  Acute ST elevation myocardial infarction (STEMI) due to occlusion of left anterior descending (LAD) coronary artery ([place]): trop > 24000. S/p of cath with stents by Dr. Fletcher Anon.  Currently no chest pain or shortness breath.  - admit to SDU as inpatient - prn Nitroglycerin, Morphine - Apirin and brilinta - Coreg - lipitor  - Risk factor stratification: will check FLP and A1C  - 2d echo  HTN (hypertension) -Coreg -IV hydralazine as needed  Hypothyroidism -Synthroid  Hypokalemia: Potassium 3.0, magnesium 1.8 -Repleted potassium  Asthma: Stable -Bronchodilators and Singulair  Leukocytosis: WBC 11.8, no signs of infection.  Likely reactive -Follow-up with CBC  Abnormal LFTs: ALT 80, AST 333, ALT 60, total bilirubin 0.6.  Patient states that she does not drink alcohol heavily. -Avoid using Tylenol -Check hepatitis panel     DVT ppx: SQ Lovenox  Code Status: Full code  Family Communication:   Yes, patient's  daughter  at bed side.      Disposition Plan:  Anticipate discharge back to previous environment  Consults called: Dr. Fletcher Anon of card   Admission status and Level of care: Stepdown:   as inpt       Dispo: The patient is from: Home              Anticipated d/c is to: Home              Anticipated d/c date is: 2 days  Patient currently is not medically stable to d/c.    Severity of Illness:  The appropriate patient status for this patient is INPATIENT. Inpatient status is judged to be reasonable and  necessary in order to provide the required intensity of service to ensure the patient's safety. The patient's presenting symptoms, physical exam findings, and initial radiographic and laboratory data in the context of their chronic comorbidities is felt to place them at high risk for further clinical deterioration. Furthermore, it is not anticipated that the patient will be medically stable for discharge from the hospital within 2 midnights of admission.   * I certify that at the point of admission it is my clinical judgment that the patient will require inpatient hospital care spanning beyond 2 midnights from the point of admission due to high intensity of service, high risk for further deterioration and high frequency of surveillance required.*       Date of Service 04/03/2022    Ivor Costa [facility]   If 7PM-7AM, please contact night-coverage [email or web address] 04/03/2022, 3:26 PM

## 2022-04-03 NOTE — ED Notes (Signed)
Patient is being discharged from the Urgent Care and sent to the Emergency Department via EMS . Per Denman George, PA, patient is in need of higher level of care due to Chest pain, shortness of breath, and dizziness. Patient is aware and verbalizes understanding of plan of care.  Vitals:   04/03/22 0855  BP: (!) 163/94  Pulse: 70  Resp: 18  SpO2: 100%

## 2022-04-03 NOTE — ED Provider Notes (Signed)
[facility] Provider Note    Event Date/Time   First MD Initiated Contact with Patient 04/03/22 1005     (approximate)   History   Chief Complaint Code STEMI   HPI  Lisa Hale is a 76 y.o. female with past medical history of hypertension, hypothyroidism, asthma, and GERD who presents to the ED complaining of chest pain.  Patient reports that she has been dealing with about 2 days of constant pressure in the center of her chest.  Pain does not seem to be exacerbated or alleviated by anything, but has gotten more severe since she woke up today.  She denies any associated fevers, cough, or shortness of breath.  She denies any history of similar symptoms in the past.  She was initially evaluated at urgent care, where EKG showed anterior ST elevation and EMS was called.  Code STEMI activated by EMS prior to arrival.      Physical Exam   Triage Vital Signs: ED Triage Vitals  Enc Vitals Group     BP 04/03/22 1003 130/65     Pulse Rate 04/03/22 1000 74     Resp 04/03/22 1000 16     Temp 04/03/22 1003 98 F (36.7 C)     Temp src --      SpO2 04/03/22 1003 99 %     Weight --      Height --      Head Circumference --      Peak Flow --      Pain Score 04/03/22 0958 5     Pain Loc --      Pain Edu? --      Excl. in [place]? --     Most recent vital signs: Vitals:   04/03/22 1003 04/03/22 1016  BP: 130/65   Pulse:    Resp:    Temp: 98 F (36.7 C)   SpO2: 99% 98%    Constitutional: Alert and oriented. Eyes: Conjunctivae are normal. Head: Atraumatic. Nose: No congestion/rhinnorhea. Mouth/Throat: Mucous membranes are moist.  Cardiovascular: Normal rate, regular rhythm. Grossly normal heart sounds.  2+ radial pulses bilaterally. Respiratory: Normal respiratory effort.  No retractions. Lungs CTAB. Gastrointestinal: Soft and nontender. No distention. Musculoskeletal: No lower extremity tenderness nor edema.  Neurologic:  Normal speech and  language. No gross focal neurologic deficits are appreciated.    ED Results / Procedures / Treatments   Labs (all labs ordered are listed, but only abnormal results are displayed) Labs Reviewed  CBC WITH DIFFERENTIAL/PLATELET - Abnormal; Notable for the following components:      Result Value   WBC 11.8 (*)    RDW 15.7 (*)    Neutro Abs 8.2 (*)    All other components within normal limits  PROTIME-INR  APTT  COMPREHENSIVE METABOLIC PANEL  LIPID PANEL  MAGNESIUM  TROPONIN I (HIGH SENSITIVITY)     EKG  ED ECG REPORT I, Blake Divine, the attending physician, personally viewed and interpreted this ECG.   Date: 04/03/2022  EKG Time: 10:01  Rate: 69  Rhythm: normal sinus rhythm  Axis: Normal  Intervals: Prolonged QT  ST&T Change: ST elevation anteriorly  PROCEDURES:  Critical Care performed: Yes, see critical care procedure note(s)  .Critical Care  Performed by: Blake Divine, MD Authorized by: Blake Divine, MD   Critical care provider statement:    Critical care time (minutes):  30   Critical care time was exclusive of:  Separately billable procedures and treating other patients  and teaching time   Critical care was necessary to treat or prevent imminent or life-threatening deterioration of the following conditions:  Cardiac failure   Critical care was time spent personally by me on the following activities:  Development of treatment plan with patient or surrogate, discussions with consultants, evaluation of patient's response to treatment, examination of patient, ordering and review of laboratory studies, ordering and review of radiographic studies, ordering and performing treatments and interventions, pulse oximetry, re-evaluation of patient's condition and review of old charts   I assumed direction of critical care for this patient from another provider in my specialty: no     Care discussed with: admitting provider      MEDICATIONS ORDERED IN  ED: Medications  midazolam (VERSED) injection (1 mg Intravenous Given 04/03/22 1017)  Heparin (Porcine) in NaCl 1000-0.9 UT/500ML-% SOLN (500 mLs  Given 04/03/22 1018)  lidocaine (PF) (XYLOCAINE) 1 % injection (2 mLs Infiltration Given 04/03/22 1019)  verapamil (ISOPTIN) injection (2.5 mg Intra-arterial Given 04/03/22 1020)  heparin sodium (porcine) injection (4,000 Units Intravenous Given 04/03/22 1030)  nitroGLYCERIN 1 mg/10 mL (100 mcg/mL) - IR/CATH LAB (200 mcg Intracoronary Given 04/03/22 1035)  heparin injection 4,000 Units (4,000 Units Intravenous Given 04/03/22 1007)     IMPRESSION / MDM / [place] / ED COURSE  I reviewed the triage vital signs and the nursing notes.                              76 y.o. female with past medical history of hypertension, hypothyroidism, asthma, and GERD who presents to the ED complaining of constant chest pressure for the past 2 days that has gotten more severe earlier today.  Patient's presentation is most consistent with acute presentation with potential threat to life or bodily function.  Differential diagnosis includes, but is not limited to, ACS, PE, dissection, pneumonia, pneumothorax, musculoskeletal pain, GERD, and anxiety.  Patient uncomfortable appearing but in no acute distress, vital signs are unremarkable.  Prehospital EKG reviewed and shows anterior as well as inferior ST elevation with deep T wave inversions, potentially concerning for [place].  Code STEMI activated prior to arrival and cardiology at bedside.  She was given loading dose of aspirin as well as nitroglycerin x 3 prior to arrival.  We will give heparin bolus here in the ED and patient taken directly to the Cath Lab.      FINAL CLINICAL IMPRESSION(S) / ED DIAGNOSES   Final diagnoses:  ST elevation myocardial infarction (STEMI), unspecified artery ([place])     Rx / DC Orders   ED Discharge Orders     None        Note:  This document was prepared using  Dragon voice recognition software and may include unintentional dictation errors.   Blake Divine, MD 04/03/22 1044

## 2022-04-03 NOTE — ED Notes (Signed)
Belongings placed in belongings bag

## 2022-04-03 NOTE — ED Notes (Signed)
Pt to cath lab [facility]

## 2022-04-04 ENCOUNTER — Encounter: Payer: Self-pay | Admitting: Cardiovascular Disease

## 2022-04-04 ENCOUNTER — Inpatient Hospital Stay (HOSPITAL_COMMUNITY)
Admit: 2022-04-04 | Discharge: 2022-04-04 | Disposition: A | Discharge status: Home / Self Care | Payer: Medicare Other | Attending: Cardiovascular Disease | Admitting: Cardiovascular Disease

## 2022-04-04 DIAGNOSIS — I2102 ST elevation (STEMI) myocardial infarction involving left anterior descending coronary artery: Secondary | ICD-10-CM | POA: Diagnosis not present

## 2022-04-04 DIAGNOSIS — I5020 Unspecified systolic (congestive) heart failure: Secondary | ICD-10-CM

## 2022-04-04 LAB — ECHOCARDIOGRAM COMPLETE
AR max vel: 1.38 cm2
AV Area VTI: 1.29 cm2
AV Area mean vel: 1.37 cm2
AV Mean grad: 5 mmHg
AV Peak grad: 11 mmHg
Ao pk vel: 1.66 m/s
Area-P 1/2: 2.95 cm2
Height: 63 in
S' Lateral: 3.6 cm
Weight: 2613.77 oz

## 2022-04-04 LAB — CBC
HCT: 39 % (ref 36.0–46.0)
Hemoglobin: 12.8 g/dL (ref 12.0–15.0)
MCH: 28.6 pg (ref 26.0–34.0)
MCHC: 32.8 g/dL (ref 30.0–36.0)
MCV: 87.2 fL (ref 80.0–100.0)
Platelets: 274 10*3/uL (ref 150–400)
RBC: 4.47 MIL/uL (ref 3.87–5.11)
RDW: 15.8 % — ABNORMAL HIGH (ref 11.5–15.5)
WBC: 9.5 10*3/uL (ref 4.0–10.5)
nRBC: 0 % (ref 0.0–0.2)

## 2022-04-04 LAB — BASIC METABOLIC PANEL
Anion gap: 9 (ref 5–15)
BUN: 15 mg/dL (ref 8–23)
CO2: 23 mmol/L (ref 22–32)
Calcium: 9.3 mg/dL (ref 8.9–10.3)
Chloride: 105 mmol/L (ref 98–111)
Creatinine, Ser: 0.99 mg/dL (ref 0.44–1.00)
GFR, Estimated: 59 mL/min — ABNORMAL LOW (ref >60)
Glucose, Bld: 102 mg/dL — ABNORMAL HIGH (ref 70–99)
Potassium: 3.7 mmol/L (ref 3.5–5.1)
Sodium: 137 mmol/L (ref 135–145)

## 2022-04-04 LAB — HEPATITIS PANEL, ACUTE
HCV Ab: NONREACTIVE
Hep A IgM: NONREACTIVE
Hep B C IgM: NONREACTIVE
Hepatitis B Surface Ag: NONREACTIVE

## 2022-04-04 LAB — HEPATIC FUNCTION PANEL
ALT: 62 U/L — ABNORMAL HIGH (ref 0–44)
AST: 264 U/L — ABNORMAL HIGH (ref 15–41)
Albumin: 3.5 g/dL (ref 3.5–5.0)
Alkaline Phosphatase: 76 U/L (ref 38–126)
Bilirubin, Direct: 0.2 mg/dL (ref 0.0–0.2)
Indirect Bilirubin: 1.4 mg/dL — ABNORMAL HIGH (ref 0.3–0.9)
Total Bilirubin: 1.6 mg/dL — ABNORMAL HIGH (ref 0.3–1.2)
Total Protein: 7.6 g/dL (ref 6.5–8.1)

## 2022-04-04 LAB — HEMOGLOBIN A1C
Hgb A1c MFr Bld: 5.7 % — ABNORMAL HIGH (ref 4.8–5.6)
Mean Plasma Glucose: 117 mg/dL

## 2022-04-04 LAB — GLUCOSE, CAPILLARY: Glucose-Capillary: 99 mg/dL (ref 70–99)

## 2022-04-04 MED ORDER — PERFLUTREN LIPID MICROSPHERE
1.0000 mL | INTRAVENOUS | Status: AC | PRN
  Administered 2022-04-04: 2 mL via INTRAVENOUS

## 2022-04-04 MED ORDER — LOSARTAN POTASSIUM 25 MG PO TABS
12.5000 mg | ORAL_TABLET | Freq: Every day | ORAL | Status: DC
  Administered 2022-04-04 – 2022-04-05 (×2): 12.5 mg via ORAL
  Filled 2022-04-04: qty 0.5
  Filled 2022-04-04: qty 1

## 2022-04-04 MED ORDER — CHLORHEXIDINE GLUCONATE CLOTH 2 % EX PADS
6.0000 | MEDICATED_PAD | Freq: Every day | CUTANEOUS | Status: DC
  Administered 2022-04-05 – 2022-04-06 (×2): 6 via TOPICAL

## 2022-04-04 MED ORDER — COENZYME Q-10 100 MG PO CAPS
100.0000 mg | ORAL_CAPSULE | Freq: Every day | ORAL | Status: DC
  Administered 2022-04-04 – 2022-04-05 (×2): 100 mg via ORAL
  Filled 2022-04-04: qty 1

## 2022-04-04 NOTE — TOC Initial Note (Signed)
Transition of Care [facility]) - Initial/Assessment Note    Patient Details  Name: Keydi Giel MRN: [id number] Date of Birth: 1946-03-28  Transition of Care [facility]) CM/SW Contact:    Shelbie Hutching, RN Phone Number: 04/04/2022, 2:20 PM  Clinical Narrative:                   Transition of Care [facility]) Screening Note   Patient Details  Name: Crysta Gulick Date of Birth: 30-Mar-1946   Transition of Care [facility]) CM/SW Contact:    Shelbie Hutching, RN Phone Number: 04/04/2022, 2:20 PM    Transition of Care Department [facility]) has reviewed patient and no TOC needs have been identified at this time. We will continue to monitor patient advancement through interdisciplinary progression rounds. If new patient transition needs arise, please place a TOC consult.         Patient Goals and CMS Choice        Expected Discharge Plan and Services                                                Prior Living Arrangements/Services                       Activities of Daily Living Home Assistive Devices/Equipment: None ADL Screening (condition at time of admission) Patient's cognitive ability adequate to safely complete daily activities?: Yes Is the patient deaf or have difficulty hearing?: No Does the patient have difficulty seeing, even when wearing glasses/contacts?: No Does the patient have difficulty concentrating, remembering, or making decisions?: Yes Patient able to express need for assistance with ADLs?: Yes Does the patient have difficulty dressing or bathing?: No Independently performs ADLs?: Yes (appropriate for developmental age) Does the patient have difficulty walking or climbing stairs?: No Weakness of Legs: Right Weakness of Arms/Hands: None  Permission Sought/Granted                  Emotional Assessment              Admission diagnosis:  Acute ST elevation myocardial infarction (STEMI) due to occlusion of left anterior  descending (LAD) coronary artery ([place]) [I21.02] Patient Active Problem List   Diagnosis Date Noted   Acute ST elevation myocardial infarction (STEMI) due to occlusion of left anterior descending (LAD) coronary artery ([place]) 04/03/2022   HTN (hypertension) 04/03/2022   Hypothyroidism 04/03/2022   Hypokalemia 04/03/2022   Abnormal LFTs 04/03/2022   Leukocytosis 04/03/2022   History of colonic polyps    Hemorrhoids without complication    Diverticulosis of large intestine without diverticulitis    Asthma 06/17/2009   SCOLIOSIS 06/17/2009   COUGH 06/17/2009   PCP:  Lynnell Jude, MD Pharmacy:   [facility] [place], [place] [facility] [address] AT [address] [place] [facility] [address] Phone: [phone] Fax: [phone]     Social Determinants of Health (SDOH) Interventions    Readmission Risk Interventions     No data to display

## 2022-04-04 NOTE — Progress Notes (Signed)
*  PRELIMINARY RESULTS* Echocardiogram 2D Echocardiogram has been performed.  Lisa Hale 04/04/2022, 9:34 AM

## 2022-04-04 NOTE — Progress Notes (Signed)
Upon assessment, both of pt's IVs leaking and removed. Pt requested no additional PIVs unless her condition changed to warrant them. Morton Amy, on-call for attending notified of pt's refusal for access. Will continue to monitor.

## 2022-04-04 NOTE — Progress Notes (Signed)
Rounding Note    Patient Name: Lisa Hale Date of Encounter: 04/04/2022  [place] Cardiologist: None   New consult done by Dr. Fletcher Anon  Subjective   Patient was seen on a.m. rounds.  She denies chest pain, shortness of breath, or right radial pain and discomfort.  She underwent left heart catheterization with PCI/DES to the mid LAD and RCA.  Inpatient Medications    Scheduled Meds:  aspirin  81 mg Oral Daily   atorvastatin  80 mg Oral QPM   carvedilol  3.125 mg Oral BID WC   Chlorhexidine Gluconate Cloth  6 each Topical Daily   cholecalciferol  1,000 Units Oral Daily   enoxaparin (LOVENOX) injection  40 mg Subcutaneous Q24H   levothyroxine  100 mcg Oral Q0600   losartan  12.5 mg Oral Daily   montelukast  10 mg Oral QHS   niacin  500 mg Oral Daily   pantoprazole  40 mg Oral Daily   sodium chloride flush  3 mL Intravenous Q12H   ticagrelor  90 mg Oral BID   Continuous Infusions:  sodium chloride     PRN Meds: sodium chloride, albuterol, dextromethorphan-guaiFENesin, hydrALAZINE, morphine injection, nitroGLYCERIN, ondansetron (ZOFRAN) IV, mouth rinse, sodium chloride flush   Vital Signs    Vitals:   04/04/22 0300 04/04/22 0400 04/04/22 0500 04/04/22 0600  BP: 112/70     Pulse: 65 72 (!) 57 (!) 56  Resp: 19 (!) 23 (!) 23 17  Temp: 98.2 F (36.8 C)     TempSrc: Oral     SpO2: 97% 98% 100% 100%  Weight:      Height:        Intake/Output Summary (Last 24 hours) at 04/04/2022 1143 Last data filed at 04/04/2022 0200 Gross per 24 hour  Intake 220 ml  Output 650 ml  Net -430 ml      04/03/2022   12:03 PM 03/27/2022   12:08 PM 03/16/2022    4:40 PM  Last 3 Weights  Weight (lbs) 163 lb 5.8 oz 159 lb 11.2 oz 161 lb  Weight (kg) 74.1 kg 72.439 kg 73.029 kg      Telemetry    Sinus rhythm with first-degree questionable junctional with rates of 60-70- Personally Reviewed  ECG    No new tracings- Personally Reviewed  Physical Exam    GEN: No acute distress.   Neck: No JVD Cardiac: RRR, I/VI soft systolic murmurs,without rubs, or gallops.  Right radial cath site with small amount of bruising without hematoma, 2+ radial pulse Respiratory: Clear to auscultation bilaterally.  Respirations are unlabored at rest on room air GI: Soft, nontender, non-distended  MS: No edema; No deformity. Neuro:  Nonfocal  Psych: Normal affect   Labs    High Sensitivity Troponin:   Recent Labs  Lab 04/03/22 0930 04/03/22 1003  TROPONINIHS >24,000* >24,000*     Chemistry Recent Labs  Lab 04/03/22 0930 04/03/22 1003 04/03/22 1224 04/04/22 0458  NA 133* 136  --  137  K 2.9* 3.0*  --  3.7  CL 99 102  --  105  CO2 24 27  --  23  GLUCOSE 110* 117*  --  102*  BUN 19 18  --  15  CREATININE 0.94 0.99  --  0.99  CALCIUM 9.1 9.3  --  9.3  MG 1.8  --  2.0  --   PROT 8.1 7.5  --  7.6  ALBUMIN 3.8 3.6  --  3.5  AST  348* 333*  --  264*  ALT 64* 60*  --  62*  ALKPHOS 88 80  --  76  BILITOT 0.4 0.6  --  1.6*  GFRNONAA >60 59*  --  59*  ANIONGAP 10 7  --  9    Lipids  Recent Labs  Lab 04/03/22 1003  CHOL 283*  TRIG 126  HDL 57  LDLCALC 201*  CHOLHDL 5.0    Hematology Recent Labs  Lab 04/03/22 0930 04/03/22 1003 04/04/22 0458  WBC 11.1* 11.8* 9.5  RBC 4.60 4.48 4.47  HGB 13.2 12.7 12.8  HCT 39.8 39.7 39.0  MCV 86.5 88.6 87.2  MCH 28.7 28.3 28.6  MCHC 33.2 32.0 32.8  RDW 15.8* 15.7* 15.8*  PLT 252 287 274   Thyroid No results for input(s): "TSH", "FREET4" in the last 168 hours.  BNPNo results for input(s): "BNP", "PROBNP" in the last 168 hours.  DDimer No results for input(s): "DDIMER" in the last 168 hours.   Radiology    DG Chest Port 1 View  Result Date: 04/03/2022 CLINICAL DATA:  Intermittent chest pain EXAM: PORTABLE CHEST 1 VIEW COMPARISON:  None Available. FINDINGS: Pronounced scoliosis convex to the right. Heart size upper limits of normal. Atherosclerotic calcification of the aorta is visible. The  lungs are clear. The vascularity is normal. No effusions. IMPRESSION: No active disease. Pronounced scoliosis convex to the right. Aortic atherosclerosis. Electronically Signed   By: Nelson Chimes M.D.   On: 04/03/2022 13:57   CARDIAC CATHETERIZATION  Result Date: 04/03/2022   2nd Diag lesion is 60% stenosed.   Mid LAD to Dist LAD lesion is 100% stenosed.   Mid LAD lesion is 40% stenosed.   Dist LAD lesion is 60% stenosed.   Prox RCA lesion is 60% stenosed.   Prox RCA to Mid RCA lesion is 95% stenosed.   A drug-eluting stent was successfully placed using a STENT ONYX FRONTIER 2.25X38.   A drug-eluting stent was successfully placed using a STENT ONYX FRONTIER 3.0X38.   Post intervention, there is a 0% residual stenosis.   Post intervention, there is a 0% residual stenosis.   Post intervention, there is a 0% residual stenosis.   There is moderate left ventricular systolic dysfunction.   LV end diastolic pressure is moderately elevated. 1.  Anterior ST elevation myocardial infarction due to an occluded mid LAD.  In addition, there is severe stenosis in the proximal right coronary artery. 2.  Moderately reduced LV systolic function with an EF of 35% with akinesis of the mid to distal anterior wall, apex and distal inferior wall.  Moderately elevated left ventricular end-diastolic pressure with an LVEDP of 27 mmHg. 3.  Successful angioplasty and drug-eluting stent placement to the mid LAD. 4.  Successful OCT guided PCI and drug-eluting stent placement to the right coronary artery. Recommendations: Dual antiplatelet therapy for at least 12 months. Aggressive treatment of risk factors. I started small dose carvedilol.   Cardiac Studies  LHC 04/03/2022   2nd Diag lesion is 60% stenosed.   Mid LAD to Dist LAD lesion is 100% stenosed.   Mid LAD lesion is 40% stenosed.   Dist LAD lesion is 60% stenosed.   Prox RCA lesion is 60% stenosed.   Prox RCA to Mid RCA lesion is 95% stenosed.   A drug-eluting stent was  successfully placed using a STENT ONYX FRONTIER 2.25X38.   A drug-eluting stent was successfully placed using a STENT ONYX FRONTIER 3.0X38.   Post intervention, there  is a 0% residual stenosis.   Post intervention, there is a 0% residual stenosis.   Post intervention, there is a 0% residual stenosis.   There is moderate left ventricular systolic dysfunction.   LV end diastolic pressure is moderately elevated.   1.  Anterior ST elevation myocardial infarction due to an occluded mid LAD.  In addition, there is severe stenosis in the proximal right coronary artery. 2.  Moderately reduced LV systolic function with an EF of 35% with akinesis of the mid to distal anterior wall, apex and distal inferior wall.  Moderately elevated left ventricular end-diastolic pressure with an LVEDP of 27 mmHg. 3.  Successful angioplasty and drug-eluting stent placement to the mid LAD. 4.  Successful OCT guided PCI and drug-eluting stent placement to the right coronary artery.   Recommendations: Dual antiplatelet therapy for at least 12 months. Aggressive treatment of risk factors. I started small dose carvedilol.  Patient Profile     76 y.o. female with a history of essential hypertension, gastroesophageal reflux disease, hypothyroidism, and scoliosis has been seen and evaluated for anterior STEMI.  Assessment & Plan    Anterior ST elevation myocardial infarction -High-sensitivity troponins peaked at greater than 24,000 -Emergent cardiac catheterization showed occluded mid LAD which was treated successfully with PCI/DES as well as severe stenosis of the proximal RCA which was treated with PCI/DES -Currently remains chest pain-free -No changes noted overnight on telemetry -Continue aspirin and Brilinta, after 30 days Brilinta can be changed to clopidogrel -Continued on low-dose of carvedilol -12.5 mg of losartan daily started today -Cardiac rehab referral -Continue with risk modification and secondary  prevention -Echocardiogram ordered and pending -Can be transferred to telemetry today  Acute systolic heart failure due to ischemic cardiomyopathy -Moderately reduced LV systolic function EF of 38% with akinesis of the mid to distal anterior wall, apex and distal inferior wall, with moderately elevated left ventricular end-diastolic pressure with an LVEDP of 27 mmHg -Low-dose carvedilol and low-dose losartan -Escalate GDMT as blood pressure allows -Repeat limited echocardiogram in 3 months to reevaluate function post MI -Daily weight, I's and O's, low-sodium diet -Heart failure education  Hypertension -Blood pressure 112/70 -Continued on carvedilol and losartan -Vital signs per unit protocol  Hyperlipidemia -LDL of 201 04/03/2022 -Continue on atorvastatin 80 mg daily -Patient previously refused to take statin medication without her co-Q10 daughter was bringing co-Q10 from home to ensure patient could take all of her medications -Needs this follow-up of lipid and hepatic panel as outpatient  Elevated liver enzymes -AST elevated 348 and 333 with an ALT of 64 and 60 likely induced from MI -Repeat AST this morning was 264 -Recommend avoid using Tylenol -Hepatitis panel nonreactive     For questions or updates, please contact [place] Please consult [email or web address] for contact info under        Signed, Ilyssa Grennan, NP  04/04/2022, 11:43 AM

## 2022-04-04 NOTE — Progress Notes (Addendum)
[place] [facility] [place] NAME: Lisa Hale    MR#:  [id number]  DATE OF BIRTH:  1945/06/07  SUBJECTIVE:  patient came in from urgent care after she was seen there for chest pressure. She was found to have ST elevation MI and was sent to the emergency room she underwent cardiac cath. She had two stents placement. She overnight remains stable. No chest pain. No family at bedside earlier during my evaluation    VITALS:  Blood pressure (!) 103/54, pulse 61, temperature 98.2 F (36.8 C), temperature source Oral, resp. rate 18, height '5\' 3"'$  (1.6 m), weight 74.1 kg, SpO2 97 %.  PHYSICAL EXAMINATION:   GENERAL:  76 y.o.-year-old patient lying in the bed with no acute distress.  LUNGS: Normal breath sounds bilaterally, no wheezing CARDIOVASCULAR: S1, S2 normal. No murmur   ABDOMEN: Soft, nontender, nondistended. Bowel sounds present.  EXTREMITIES: No  edema b/l.    NEUROLOGIC: nonfocal  patient is alert and awake SKIN: No obvious rash, lesion, or ulcer.   LABORATORY PANEL:  CBC Recent Labs  Lab 04/04/22 0458  WBC 9.5  HGB 12.8  HCT 39.0  PLT 274    Chemistries  Recent Labs  Lab 04/03/22 1224 04/04/22 0458  NA  --  137  K  --  3.7  CL  --  105  CO2  --  23  GLUCOSE  --  102*  BUN  --  15  CREATININE  --  0.99  CALCIUM  --  9.3  MG 2.0  --   AST  --  264*  ALT  --  62*  ALKPHOS  --  76  BILITOT  --  1.6*   Cardiac Enzymes No results for input(s): "TROPONINI" in the last 168 hours. RADIOLOGY:  DG Chest Port 1 View  Result Date: 04/03/2022 CLINICAL DATA:  Intermittent chest pain EXAM: PORTABLE CHEST 1 VIEW COMPARISON:  None Available. FINDINGS: Pronounced scoliosis convex to the right. Heart size upper limits of normal. Atherosclerotic calcification of the aorta is visible. The lungs are clear. The vascularity is normal. No effusions. IMPRESSION: No active disease. Pronounced scoliosis convex to the right. Aortic  atherosclerosis. Electronically Signed   By: Nelson Chimes M.D.   On: 04/03/2022 13:57   CARDIAC CATHETERIZATION  Result Date: 04/03/2022   2nd Diag lesion is 60% stenosed.   Mid LAD to Dist LAD lesion is 100% stenosed.   Mid LAD lesion is 40% stenosed.   Dist LAD lesion is 60% stenosed.   Prox RCA lesion is 60% stenosed.   Prox RCA to Mid RCA lesion is 95% stenosed.   A drug-eluting stent was successfully placed using a STENT ONYX FRONTIER 2.25X38.   A drug-eluting stent was successfully placed using a STENT ONYX FRONTIER 3.0X38.   Post intervention, there is a 0% residual stenosis.   Post intervention, there is a 0% residual stenosis.   Post intervention, there is a 0% residual stenosis.   There is moderate left ventricular systolic dysfunction.   LV end diastolic pressure is moderately elevated. 1.  Anterior ST elevation myocardial infarction due to an occluded mid LAD.  In addition, there is severe stenosis in the proximal right coronary artery. 2.  Moderately reduced LV systolic function with an EF of 35% with akinesis of the mid to distal anterior wall, apex and distal inferior wall.  Moderately elevated left ventricular end-diastolic pressure with an LVEDP of 27 mmHg. 3.  Successful angioplasty  and drug-eluting stent placement to the mid LAD. 4.  Successful OCT guided PCI and drug-eluting stent placement to the right coronary artery. Recommendations: Dual antiplatelet therapy for at least 12 months. Aggressive treatment of risk factors. I started small dose carvedilol.    Assessment and Plan  Lisa Hale is a 76 y.o. female with medical history significant of hypertension, asthma, GERD, hypothyroidism, scoliosis, vertigo, glaucoma, who presents with chest pressure. Patient was found to have ST elevation MI and was taken the cardiac Cath Lab yesterday.   Acute ST elevation myocardial infarction (STEMI) due to occlusion of left anterior descending (LAD) coronary artery ([place]): trop > 24000.   --S/p of cath with stents with mid LAD and RCA by Dr. Fletcher Anon.  -- Currently no chest pain or shortness breath.  - Apirin and brilinta and after 30 days change brilinta to Plavix - Coreg, added low dose losartan - lipitor  - echo pending -cardiac rehab referral at discharge --ok  to transfer out of ICU per cardiology   HTN (hypertension) -Coreg, losartan -IV hydralazine as needed   Hypothyroidism -Synthroid   Hypokalemia: Potassium 3.0, magnesium 1.8 -Repleted potassium   Asthma: Stable -Bronchodilators and Singulair   Leukocytosis: WBC 11.8, no signs of infection.  Likely reactive -Follow-up with CBC   Abnormal LFTs: ALT 80, AST 333, ALT 60, total bilirubin 0.6.  -- Trending down likely induced from MI -- hepatitis panel nonreactive    DVT ppx: SQ Lovenox   Code Status: Full code   Family Communication:none today   Disposition Plan:  Anticipate discharge back to previous environment   Consults called: Dr. Mathews [place] of card   Procedures:LHC  Level of care: Telemetry Cardiac Status is: Inpatient Remains inpatient appropriate because: cardiology continue to monitor one more night if remains stable discharge tomorrow    TOTAL TIME TAKING CARE OF THIS PATIENT: 35 minutes.  >50% time spent on counselling and coordination of care  Note: This dictation was prepared with Dragon dictation along with smaller phrase technology. Any transcriptional errors that result from this process are unintentional.  Fritzi Mandes M.D    [facility]   CC: Primary care physician; Lynnell Jude, MD

## 2022-04-05 ENCOUNTER — Other Ambulatory Visit: Payer: Self-pay

## 2022-04-05 DIAGNOSIS — I249 Acute ischemic heart disease, unspecified: Secondary | ICD-10-CM

## 2022-04-05 DIAGNOSIS — E038 Other specified hypothyroidism: Secondary | ICD-10-CM | POA: Diagnosis not present

## 2022-04-05 DIAGNOSIS — I255 Ischemic cardiomyopathy: Secondary | ICD-10-CM

## 2022-04-05 DIAGNOSIS — I2102 ST elevation (STEMI) myocardial infarction involving left anterior descending coronary artery: Secondary | ICD-10-CM | POA: Diagnosis not present

## 2022-04-05 DIAGNOSIS — I10 Essential (primary) hypertension: Secondary | ICD-10-CM | POA: Diagnosis not present

## 2022-04-05 LAB — COMPREHENSIVE METABOLIC PANEL
ALT: 40 U/L (ref 0–44)
AST: 111 U/L — ABNORMAL HIGH (ref 15–41)
Albumin: 3.3 g/dL — ABNORMAL LOW (ref 3.5–5.0)
Alkaline Phosphatase: 76 U/L (ref 38–126)
Anion gap: 6 (ref 5–15)
BUN: 24 mg/dL — ABNORMAL HIGH (ref 8–23)
CO2: 25 mmol/L (ref 22–32)
Calcium: 8.9 mg/dL (ref 8.9–10.3)
Chloride: 103 mmol/L (ref 98–111)
Creatinine, Ser: 1.22 mg/dL — ABNORMAL HIGH (ref 0.44–1.00)
GFR, Estimated: 46 mL/min — ABNORMAL LOW (ref >60)
Glucose, Bld: 97 mg/dL (ref 70–99)
Potassium: 3.6 mmol/L (ref 3.5–5.1)
Sodium: 134 mmol/L — ABNORMAL LOW (ref 135–145)
Total Bilirubin: 1.3 mg/dL — ABNORMAL HIGH (ref 0.3–1.2)
Total Protein: 7.2 g/dL (ref 6.5–8.1)

## 2022-04-05 LAB — LIPOPROTEIN A (LPA): Lipoprotein (a): 91.1 nmol/L — ABNORMAL HIGH (ref <75.0)

## 2022-04-05 MED ORDER — LOSARTAN POTASSIUM 25 MG PO TABS
25.0000 mg | ORAL_TABLET | Freq: Every day | ORAL | Status: DC
  Administered 2022-04-06: 25 mg via ORAL
  Filled 2022-04-05: qty 1

## 2022-04-05 MED ORDER — FUROSEMIDE 20 MG PO TABS
20.0000 mg | ORAL_TABLET | Freq: Every day | ORAL | Status: DC
  Administered 2022-04-05 – 2022-04-06 (×2): 20 mg via ORAL
  Filled 2022-04-05 (×2): qty 1

## 2022-04-05 MED ORDER — TICAGRELOR 90 MG PO TABS
90.0000 mg | ORAL_TABLET | Freq: Two times a day (BID) | ORAL | 0 refills | Status: DC
  Filled 2022-04-05: qty 60, 30d supply, fill #0

## 2022-04-05 NOTE — Progress Notes (Signed)
PROGRESS NOTE  Rogue Pautler    DOB: Feb 13, 1946, 76 y.o.  MLY:[id number]    Code Status: Full Code   DOA: 04/03/2022   LOS: 2   Brief hospital course  Kemya Shed is a 76 y.o. female with a PMH significant for hypertension, asthma, GERD, hypothyroidism, scoliosis, vertigo, glaucoma .  They presented from home to the ED on 04/03/2022 with chest pressure x 1 days. Also had discomfort in bilateral arms.   In the ED, it was found that they had mild bradycardia, and O2 desaturations to 72% on room air.  Significant findings included trop> 24000, WBC 11.8, INR 1.0, PTT 29, creatinine 0.99, BUN 18, potassium 3.0, abnormal liver function (ALP 80, AST 333, ALT 60, total bilirubin 0.6) . Chest xray negative.  Cardiology was consulted for STEMI.   They were initially treated with urgent cardiac cath and had 2 stents placed.   Patient was admitted to medicine service for further workup and management of STEMI as outlined in detail below.  04/05/22 -stable, wants to go home  Assessment & Plan  Principal Problem:   Acute ST elevation myocardial infarction (STEMI) due to occlusion of left anterior descending (LAD) coronary artery (HCC) Active Problems:   HTN (hypertension)   Hypothyroidism   Hypokalemia   Asthma   Leukocytosis   Abnormal LFTs   HFrEF (heart failure with reduced ejection fraction) (HCC)  Acute ST elevation myocardial infarction (STEMI) due to occlusion of left anterior descending (LAD) coronary artery ([place]): trop > 24000.  S/p cath with DES 12/4 with mid LAD and RCA by Dr. Fletcher Anon.  Currently no chest pain or shortness breath.  - Apirin and brilinta and after 30 days change brilinta to Plavix - Coreg, added low dose losartan - lipitor - cardiology following, appreciate recs  Ischemic cardiomyopathy- echo showing EF 25-30% - coreg - lasix - life vest ordered and fitted today.    HTN (hypertension) -Coreg, losartan   Hypothyroidism -Synthroid    Hypokalemia -monitor and Replete potassium PRN   Asthma: Stable -Bronchodilators and Singulair   Abnormal LFTs: ALT 80, AST 333, ALT 60, total bilirubin 0.6.  -- Trending down likely induced from MI -- hepatitis panel nonreactive  Body mass index is 28.94 kg/m.  VTE ppx: enoxaparin (LOVENOX) injection 40 mg Start: 04/04/22 0800   Diet:     Diet   Diet Heart Room service appropriate? Yes; Fluid consistency: Thin   Consultants: Cardiology   Subjective 04/05/22    Pt reports no concerns today. She denies chest pain, SOB. She would like to go home.    Objective   Vitals:   04/04/22 2100 04/04/22 2200 04/04/22 2243 04/05/22 0541  BP:   (!) 131/55 (!) 127/54  Pulse: 62 (!) 58 (!) 58 65  Resp: 18 (!) 28 16   Temp:   97.7 F (36.5 C) 98 F (36.7 C)  TempSrc:   Oral Oral  SpO2: 100% 98% 98% 97%  Weight:      Height:        Intake/Output Summary (Last 24 hours) at 04/05/2022 0800 Last data filed at 04/04/2022 2243 Gross per 24 hour  Intake 120 ml  Output 650 ml  Net -530 ml   Filed Weights   04/03/22 1203  Weight: 74.1 kg     Physical Exam:  General: awake, alert, NAD HEENT: atraumatic, clear conjunctiva, anicteric sclera, MMM, hearing grossly normal Respiratory: normal respiratory effort. Cardiovascular: quick capillary refill, normal S1/S2, RRR, no JVD, murmurs Nervous:  A&O x3. no gross focal neurologic deficits, normal speech Extremities: moves all equally, no edema, normal tone Skin: dry, intact, normal temperature, normal color. No rashes, lesions or ulcers on exposed skin Psychiatry: normal mood, congruent affect  Labs   I have personally reviewed the following labs and imaging studies CBC    Component Value Date/Time   WBC 9.5 04/04/2022 0458   RBC 4.47 04/04/2022 0458   HGB 12.8 04/04/2022 0458   HCT 39.0 04/04/2022 0458   PLT 274 04/04/2022 0458   MCV 87.2 04/04/2022 0458   MCH 28.6 04/04/2022 0458   MCHC 32.8 04/04/2022 0458   RDW 15.8  (H) 04/04/2022 0458   LYMPHSABS 2.5 04/03/2022 1003   MONOABS 1.0 04/03/2022 1003   EOSABS 0.1 04/03/2022 1003   BASOSABS 0.1 04/03/2022 1003      Latest Ref Rng & Units 04/04/2022    4:58 AM 04/03/2022   10:03 AM 04/03/2022    9:30 AM  BMP  Glucose 70 - 99 mg/dL 102  117  110   BUN 8 - 23 mg/dL '15  18  19   '$ Creatinine 0.44 - 1.00 mg/dL 0.99  0.99  0.94   Sodium 135 - 145 mmol/L 137  136  133   Potassium 3.5 - 5.1 mmol/L 3.7  3.0  2.9   Chloride 98 - 111 mmol/L 105  102  99   CO2 22 - 32 mmol/L '23  27  24   '$ Calcium 8.9 - 10.3 mg/dL 9.3  9.3  9.1     ECHOCARDIOGRAM COMPLETE  Result Date: 04/04/2022    ECHOCARDIOGRAM REPORT   Patient Name:   JENAH VANASTEN [facility] Date of Exam: 04/04/2022 Medical Rec #:  [id number]              Height:       63.0 in Accession #:    [id number]             Weight:       163.4 lb Date of Birth:  May 01, 1946              BSA:          1.774 m Patient Age:    85 years               BP:           112/70 mmHg Patient Gender: F                      HR:           61 bpm. Exam Location:  [facility] Procedure: 2D Echo, Color Doppler, Cardiac Doppler and Intracardiac            Opacification Agent Indications:     I21.9 Acute myocardial infarction, unspecified  History:         Patient has no prior history of Echocardiogram examinations.                  Risk Factors:Hypertension.  Sonographer:     Charmayne Sheer Referring Phys:  [id number] A ARIDA Diagnosing Phys: Nelva Bush MD  Sonographer Comments: Suboptimal apical window and no subcostal window. IMPRESSIONS  1. Left ventricular ejection fraction, by estimation, is 25 to 30%. The left ventricle has severely decreased function. The left ventricle demonstrates regional wall motion abnormalities (see scoring diagram/findings for description). Left ventricular diastolic parameters are consistent with Grade I diastolic dysfunction (impaired relaxation).  2. Right ventricular systolic function  is mildly reduced with akinesis of the  apex. The right ventricular size is moderately enlarged.  3. The mitral valve is normal in structure. Mild mitral valve regurgitation.  4. The aortic valve has an indeterminant number of cusps. There is mild calcification of the aortic valve. There is moderate thickening of the aortic valve. Aortic valve regurgitation is not visualized. Mild aortic valve stenosis. Aortic valve area, by VTI measures 1.29 cm. Aortic valve mean gradient measures 5.0 mmHg. FINDINGS  Left Ventricle: Left ventricular ejection fraction, by estimation, is 25 to 30%. The left ventricle has severely decreased function. The left ventricle demonstrates regional wall motion abnormalities. Definity contrast agent was given IV to delineate the left ventricular endocardial borders. The left ventricular internal cavity size was normal in size. There is no left ventricular hypertrophy. Left ventricular diastolic parameters are consistent with Grade I diastolic dysfunction (impaired relaxation).  LV Wall Scoring: The mid and distal anterior septum, apical lateral segment, mid inferoseptal segment, apical anterior segment, and apical inferior segment are akinetic. The mid inferolateral segment, mid anterolateral segment, mid anterior segment, and mid inferior segment are hypokinetic. The basal anteroseptal segment, basal inferolateral segment, basal anterolateral segment, basal anterior segment, basal inferior segment, and basal inferoseptal segment are normal. Right Ventricle: The right ventricular size is moderately enlarged. No increase in right ventricular wall thickness. Right ventricular systolic function is mildly reduced with akinesis of the apex. Left Atrium: Left atrial size was normal in size. Right Atrium: Right atrial size was not well visualized. Pericardium: The pericardium was not well visualized. Mitral Valve: The mitral valve is normal in structure. Mild mitral valve regurgitation. Tricuspid Valve: The tricuspid valve is grossly  normal. Tricuspid valve regurgitation is mild. Aortic Valve: The aortic valve has an indeterminant number of cusps. There is mild calcification of the aortic valve. There is moderate thickening of the aortic valve. Aortic valve regurgitation is not visualized. Mild aortic stenosis is present. Aortic valve mean gradient measures 5.0 mmHg. Aortic valve peak gradient measures 11.0 mmHg. Aortic valve area, by VTI measures 1.29 cm. Pulmonic Valve: The pulmonic valve was not well visualized. Pulmonic valve regurgitation is not visualized. No evidence of pulmonic stenosis. Aorta: The aortic root is normal in size and structure. Pulmonary Artery: The pulmonary artery is not well seen. Venous: The inferior vena cava was not well visualized. IAS/Shunts: The interatrial septum was not well visualized.  LEFT VENTRICLE PLAX 2D LVIDd:         4.80 cm   Diastology LVIDs:         3.60 cm   LV e' medial:    6.09 cm/s LV PW:         0.80 cm   LV E/e' medial:  12.1 LV IVS:        0.90 cm   LV e' lateral:   4.90 cm/s LVOT diam:     2.00 cm   LV E/e' lateral: 15.1 LV SV:         41 LV SV Index:   23 LVOT Area:     3.14 cm  LEFT ATRIUM           Index LA diam:      3.30 cm 1.86 cm/m LA Vol (A4C): 30.0 ml 16.91 ml/m  AORTIC VALVE                     PULMONIC VALVE AV Area (Vmax):    1.38 cm      PV  Vmax:       0.90 m/s AV Area (Vmean):   1.37 cm      PV Vmean:      62.900 cm/s AV Area (VTI):     1.29 cm      PV VTI:        0.176 m AV Vmax:           166.00 cm/s   PV Peak grad:  3.2 mmHg AV Vmean:          106.000 cm/s  PV Mean grad:  2.0 mmHg AV VTI:            0.316 m AV Peak Grad:      11.0 mmHg AV Mean Grad:      5.0 mmHg LVOT Vmax:         72.70 cm/s LVOT Vmean:        46.200 cm/s LVOT VTI:          0.130 m LVOT/AV VTI ratio: 0.41  AORTA Ao Root diam: 2.70 cm MITRAL VALVE               TRICUSPID VALVE MV Area (PHT): 2.95 cm    TR Peak grad:   28.1 mmHg MV Decel Time: 257 msec    TR Vmax:        265.00 cm/s MV E velocity: 73.90  cm/s MV A velocity: 99.00 cm/s  SHUNTS MV E/A ratio:  0.75        Systemic VTI:  0.13 m                            Systemic Diam: 2.00 cm Nelva Bush MD Electronically signed by Nelva Bush MD Signature Date/Time: 04/04/2022/6:04:48 PM    Final    DG Chest Port 1 View  Result Date: 04/03/2022 CLINICAL DATA:  Intermittent chest pain EXAM: PORTABLE CHEST 1 VIEW COMPARISON:  None Available. FINDINGS: Pronounced scoliosis convex to the right. Heart size upper limits of normal. Atherosclerotic calcification of the aorta is visible. The lungs are clear. The vascularity is normal. No effusions. IMPRESSION: No active disease. Pronounced scoliosis convex to the right. Aortic atherosclerosis. Electronically Signed   By: Nelson Chimes M.D.   On: 04/03/2022 13:57   CARDIAC CATHETERIZATION  Result Date: 04/03/2022   2nd Diag lesion is 60% stenosed.   Mid LAD to Dist LAD lesion is 100% stenosed.   Mid LAD lesion is 40% stenosed.   Dist LAD lesion is 60% stenosed.   Prox RCA lesion is 60% stenosed.   Prox RCA to Mid RCA lesion is 95% stenosed.   A drug-eluting stent was successfully placed using a STENT ONYX FRONTIER 2.25X38.   A drug-eluting stent was successfully placed using a STENT ONYX FRONTIER 3.0X38.   Post intervention, there is a 0% residual stenosis.   Post intervention, there is a 0% residual stenosis.   Post intervention, there is a 0% residual stenosis.   There is moderate left ventricular systolic dysfunction.   LV end diastolic pressure is moderately elevated. 1.  Anterior ST elevation myocardial infarction due to an occluded mid LAD.  In addition, there is severe stenosis in the proximal right coronary artery. 2.  Moderately reduced LV systolic function with an EF of 35% with akinesis of the mid to distal anterior wall, apex and distal inferior wall.  Moderately elevated left ventricular end-diastolic pressure with an LVEDP of 27 mmHg. 3.  Successful angioplasty and  drug-eluting stent placement to the  mid LAD. 4.  Successful OCT guided PCI and drug-eluting stent placement to the right coronary artery. Recommendations: Dual antiplatelet therapy for at least 12 months. Aggressive treatment of risk factors. I started small dose carvedilol.   Disposition Plan & Communication  Patient status: Inpatient  Admitted From: Home Planned disposition location: Home Anticipated discharge date: 12/7 pending life vest set up  Family Communication: none    Author: Richarda Osmond, DO [facility] 04/05/2022, 8:00 AM   Available by Epic secure chat 7AM-7PM. If 7PM-7AM, please contact night-coverage.  [facility] contact information found on [email or web address].

## 2022-04-05 NOTE — Progress Notes (Signed)
Rounding Note    Patient Name: Lisa Hale Date of Encounter: 04/05/2022  [place] Cardiologist: Kathlyn Sacramento, MD   Subjective   Feels well, denies chest pain, has minimal shortness of breath.  Inpatient Medications    Scheduled Meds:  aspirin  81 mg Oral Daily   atorvastatin  80 mg Oral QPM   carvedilol  3.125 mg Oral BID WC   Chlorhexidine Gluconate Cloth  6 each Topical Daily   cholecalciferol  1,000 Units Oral Daily   Coenzyme Q-10  100 mg Oral Daily   enoxaparin (LOVENOX) injection  40 mg Subcutaneous Q24H   levothyroxine  100 mcg Oral Q0600   losartan  12.5 mg Oral Daily   montelukast  10 mg Oral QHS   niacin  500 mg Oral Daily   pantoprazole  40 mg Oral Daily   sodium chloride flush  3 mL Intravenous Q12H   ticagrelor  90 mg Oral BID   Continuous Infusions:  sodium chloride     PRN Meds: sodium chloride, albuterol, dextromethorphan-guaiFENesin, morphine injection, nitroGLYCERIN, ondansetron (ZOFRAN) IV, mouth rinse, sodium chloride flush   Vital Signs    Vitals:   04/05/22 0806 04/05/22 0809 04/05/22 0819 04/05/22 1130  BP: (!) 119/58 (!) 119/58 (!) 120/54 (!) 105/45  Pulse: 61 61 (!) 56 (!) 58  Resp:  '18 16 16  '$ Temp: 97.8 F (36.6 C) 97.8 F (36.6 C) 97.6 F (36.4 C)   TempSrc: Oral Oral Oral   SpO2: 98% 95% 95% 99%  Weight:      Height:        Intake/Output Summary (Last 24 hours) at 04/05/2022 1527 Last data filed at 04/05/2022 1405 Gross per 24 hour  Intake 480 ml  Output 650 ml  Net -170 ml      04/03/2022   12:03 PM 03/27/2022   12:08 PM 03/16/2022    4:40 PM  Last 3 Weights  Weight (lbs) 163 lb 5.8 oz 159 lb 11.2 oz 161 lb  Weight (kg) 74.1 kg 72.439 kg 73.029 kg      Telemetry    Normal sinus rhythm- Personally Reviewed  ECG     - Personally Reviewed  Physical Exam   GEN: No acute distress.   Neck: No JVD Cardiac: RRR, no murmurs, rubs, or gallops.  Respiratory: Diminished breath sounds at  bases, no wheezing GI: Soft, nontender, non-distended  MS: No edema; No deformity. Neuro:  Nonfocal  Psych: Normal affect   Labs    High Sensitivity Troponin:   Recent Labs  Lab 04/03/22 0930 04/03/22 1003  TROPONINIHS >24,000* >24,000*     Chemistry Recent Labs  Lab 04/03/22 0930 04/03/22 1003 04/03/22 1224 04/04/22 0458 04/05/22 0827  NA 133* 136  --  137 134*  K 2.9* 3.0*  --  3.7 3.6  CL 99 102  --  105 103  CO2 24 27  --  23 25  GLUCOSE 110* 117*  --  102* 97  BUN 19 18  --  15 24*  CREATININE 0.94 0.99  --  0.99 1.22*  CALCIUM 9.1 9.3  --  9.3 8.9  MG 1.8  --  2.0  --   --   PROT 8.1 7.5  --  7.6 7.2  ALBUMIN 3.8 3.6  --  3.5 3.3*  AST 348* 333*  --  264* 111*  ALT 64* 60*  --  62* 40  ALKPHOS 88 80  --  76 76  BILITOT 0.4  0.6  --  1.6* 1.3*  GFRNONAA >60 59*  --  59* 46*  ANIONGAP 10 7  --  9 6    Lipids  Recent Labs  Lab 04/03/22 1003  CHOL 283*  TRIG 126  HDL 57  LDLCALC 201*  CHOLHDL 5.0    Hematology Recent Labs  Lab 04/03/22 0930 04/03/22 1003 04/04/22 0458  WBC 11.1* 11.8* 9.5  RBC 4.60 4.48 4.47  HGB 13.2 12.7 12.8  HCT 39.8 39.7 39.0  MCV 86.5 88.6 87.2  MCH 28.7 28.3 28.6  MCHC 33.2 32.0 32.8  RDW 15.8* 15.7* 15.8*  PLT 252 287 274   Thyroid No results for input(s): "TSH", "FREET4" in the last 168 hours.  BNPNo results for input(s): "BNP", "PROBNP" in the last 168 hours.  DDimer No results for input(s): "DDIMER" in the last 168 hours.   Radiology    ECHOCARDIOGRAM COMPLETE  Result Date: 04/04/2022    ECHOCARDIOGRAM REPORT   Patient Name:   TEAGHAN MELROSE [facility] Date of Exam: 04/04/2022 Medical Rec #:  [id number]              Height:       63.0 in Accession #:    [id number]             Weight:       163.4 lb Date of Birth:  05-Apr-1946              BSA:          1.774 m Patient Age:    21 years               BP:           112/70 mmHg Patient Gender: F                      HR:           61 bpm. Exam Location:  [facility] Procedure: 2D  Echo, Color Doppler, Cardiac Doppler and Intracardiac            Opacification Agent Indications:     I21.9 Acute myocardial infarction, unspecified  History:         Patient has no prior history of Echocardiogram examinations.                  Risk Factors:Hypertension.  Sonographer:     Charmayne Sheer Referring Phys:  [id number] A ARIDA Diagnosing Phys: Nelva Bush MD  Sonographer Comments: Suboptimal apical window and no subcostal window. IMPRESSIONS  1. Left ventricular ejection fraction, by estimation, is 25 to 30%. The left ventricle has severely decreased function. The left ventricle demonstrates regional wall motion abnormalities (see scoring diagram/findings for description). Left ventricular diastolic parameters are consistent with Grade I diastolic dysfunction (impaired relaxation).  2. Right ventricular systolic function is mildly reduced with akinesis of the apex. The right ventricular size is moderately enlarged.  3. The mitral valve is normal in structure. Mild mitral valve regurgitation.  4. The aortic valve has an indeterminant number of cusps. There is mild calcification of the aortic valve. There is moderate thickening of the aortic valve. Aortic valve regurgitation is not visualized. Mild aortic valve stenosis. Aortic valve area, by VTI measures 1.29 cm. Aortic valve mean gradient measures 5.0 mmHg. FINDINGS  Left Ventricle: Left ventricular ejection fraction, by estimation, is 25 to 30%. The left ventricle has severely decreased function. The left ventricle demonstrates regional wall motion abnormalities. Definity contrast agent  was given IV to delineate the left ventricular endocardial borders. The left ventricular internal cavity size was normal in size. There is no left ventricular hypertrophy. Left ventricular diastolic parameters are consistent with Grade I diastolic dysfunction (impaired relaxation).  LV Wall Scoring: The mid and distal anterior septum, apical lateral segment, mid  inferoseptal segment, apical anterior segment, and apical inferior segment are akinetic. The mid inferolateral segment, mid anterolateral segment, mid anterior segment, and mid inferior segment are hypokinetic. The basal anteroseptal segment, basal inferolateral segment, basal anterolateral segment, basal anterior segment, basal inferior segment, and basal inferoseptal segment are normal. Right Ventricle: The right ventricular size is moderately enlarged. No increase in right ventricular wall thickness. Right ventricular systolic function is mildly reduced with akinesis of the apex. Left Atrium: Left atrial size was normal in size. Right Atrium: Right atrial size was not well visualized. Pericardium: The pericardium was not well visualized. Mitral Valve: The mitral valve is normal in structure. Mild mitral valve regurgitation. Tricuspid Valve: The tricuspid valve is grossly normal. Tricuspid valve regurgitation is mild. Aortic Valve: The aortic valve has an indeterminant number of cusps. There is mild calcification of the aortic valve. There is moderate thickening of the aortic valve. Aortic valve regurgitation is not visualized. Mild aortic stenosis is present. Aortic valve mean gradient measures 5.0 mmHg. Aortic valve peak gradient measures 11.0 mmHg. Aortic valve area, by VTI measures 1.29 cm. Pulmonic Valve: The pulmonic valve was not well visualized. Pulmonic valve regurgitation is not visualized. No evidence of pulmonic stenosis. Aorta: The aortic root is normal in size and structure. Pulmonary Artery: The pulmonary artery is not well seen. Venous: The inferior vena cava was not well visualized. IAS/Shunts: The interatrial septum was not well visualized.  LEFT VENTRICLE PLAX 2D LVIDd:         4.80 cm   Diastology LVIDs:         3.60 cm   LV e' medial:    6.09 cm/s LV PW:         0.80 cm   LV E/e' medial:  12.1 LV IVS:        0.90 cm   LV e' lateral:   4.90 cm/s LVOT diam:     2.00 cm   LV E/e' lateral: 15.1  LV SV:         41 LV SV Index:   23 LVOT Area:     3.14 cm  LEFT ATRIUM           Index LA diam:      3.30 cm 1.86 cm/m LA Vol (A4C): 30.0 ml 16.91 ml/m  AORTIC VALVE                     PULMONIC VALVE AV Area (Vmax):    1.38 cm      PV Vmax:       0.90 m/s AV Area (Vmean):   1.37 cm      PV Vmean:      62.900 cm/s AV Area (VTI):     1.29 cm      PV VTI:        0.176 m AV Vmax:           166.00 cm/s   PV Peak grad:  3.2 mmHg AV Vmean:          106.000 cm/s  PV Mean grad:  2.0 mmHg AV VTI:            0.316 m  AV Peak Grad:      11.0 mmHg AV Mean Grad:      5.0 mmHg LVOT Vmax:         72.70 cm/s LVOT Vmean:        46.200 cm/s LVOT VTI:          0.130 m LVOT/AV VTI ratio: 0.41  AORTA Ao Root diam: 2.70 cm MITRAL VALVE               TRICUSPID VALVE MV Area (PHT): 2.95 cm    TR Peak grad:   28.1 mmHg MV Decel Time: 257 msec    TR Vmax:        265.00 cm/s MV E velocity: 73.90 cm/s MV A velocity: 99.00 cm/s  SHUNTS MV E/A ratio:  0.75        Systemic VTI:  0.13 m                            Systemic Diam: 2.00 cm Nelva Bush MD Electronically signed by Nelva Bush MD Signature Date/Time: 04/04/2022/6:04:48 PM    Final     Cardiac Studies   TTE12/08/2021 1. Left ventricular ejection fraction, by estimation, is 25 to 30%. The  left ventricle has severely decreased function. The left ventricle  demonstrates regional wall motion abnormalities (see scoring  diagram/findings for description). Left ventricular  diastolic parameters are consistent with Grade I diastolic dysfunction  (impaired relaxation).   2. Right ventricular systolic function is mildly reduced with akinesis of  the apex. The right ventricular size is moderately enlarged.   3. The mitral valve is normal in structure. Mild mitral valve  regurgitation.   4. The aortic valve has an indeterminant number of cusps. There is mild  calcification of the aortic valve. There is moderate thickening of the  aortic valve. Aortic valve regurgitation  is not visualized. Mild aortic  valve stenosis. Aortic valve area, by  VTI measures 1.29 cm. Aortic valve mean gradient measures 5.0 mmHg.    Patient Profile     76 y.o. female with history of hypertension, scoliosis presenting with chest pain found to have anterior ST elevated MI.  S/p drug-eluting stent to mid LAD and proximal RCA.  Assessment & Plan    Anterior STEMI s/p DES to mid LAD, proximal RCA -Denies chest pain -Continue aspirin, Brilinta, Lipitor 80  2.  Ischemic cardiomyopathy EF 25 to 30% -Coreg 3.125 mg twice daily, losartan 25 -Start Lasix 20 mg daily -Place LifeVest today, [employer] rep contacted. -Plan to discharge in the morning after LifeVest placement if no setbacks.  3.  Hypertension -BP controlled -Coreg, losartan, Lasix as above  Total encounter time more than 50 minutes  Greater than 50% was spent in counseling and coordination of care with the patient     Signed, Kate Sable, MD  04/05/2022, 3:27 PM

## 2022-04-05 NOTE — Progress Notes (Signed)
Chrys Racer, with [employer], has placed order for lifevest and hopes to get pt fitted by today.  If not, it'll probably be tomorrow in the am.  She states that she has everything that she needs once she get the approval for the order.  [place], [place]

## 2022-04-05 NOTE — Discharge Summary (Signed)
Physician Discharge Summary  Patient: Lisa Hale LDJ:[id number] DOB: 1945-08-25   Code Status: Full Code Admit date: 04/03/2022 Discharge date: 04/06/2022 Disposition: Home, No home health services recommended PCP: Lynnell Jude, MD  Recommendations for Outpatient Follow-up:  Follow up with PCP within 1-2 weeks Regarding regular hospital follow up and preventative care Recommend CMP, CBC Follow up with cardiology/HF clinic within 1-2 weeks Regarding medication monitoring   Discharge Diagnoses:  Principal Problem:   Acute ST elevation myocardial infarction (STEMI) due to occlusion of left anterior descending (LAD) coronary artery ([place]) Active Problems:   HTN (hypertension)   Hypothyroidism   Hypokalemia   Asthma   Leukocytosis   Abnormal LFTs   HFrEF (heart failure with reduced ejection fraction) (HCC)   Ischemic cardiomyopathy   Ischemia, myocardial, acute [facility])  Brief Hospital Course Summary: Lisa Hale is a 76 y.o. female with a PMH significant for hypertension, asthma, GERD, hypothyroidism, scoliosis, vertigo, glaucoma .   They presented from home to the ED on 04/03/2022 with chest pressure x 1 days. Also had discomfort in bilateral arms.    In the ED, it was found that they had mild bradycardia, and O2 desaturations to 72% on room air.  Significant findings included trop> 24000, WBC 11.8, INR 1.0, PTT 29, creatinine 0.99, BUN 18, potassium 3.0, abnormal liver function (ALP 80, AST 333, ALT 60, total bilirubin 0.6) . Chest xray negative.  Cardiology was consulted for STEMI.    They were initially treated with urgent cardiac cath and had 2 stents placed. Patient tolerated procedure well and chest pain resolved.  Echo showed severe function reduction of EF <20%. A life vest was fitted and pt was discharged with this.  She was hemodynamically stable and symptom free on day of discharge.  Medication additions/modifications are listed below.    Discharge Condition: Stable, improved Recommended discharge diet: Cardiac diet  Consultations: Cardiology   Procedures/Studies: PCI Echo   Discharge Instructions     AMB Referral to Cardiac Rehabilitation - Phase II   Complete by: As directed    Diagnosis:  Coronary Stents STEMI     After initial evaluation and assessments completed: Virtual Based Care may be provided alone or in conjunction with Phase 2 Cardiac Rehab based on patient barriers.: Yes   Intensive Cardiac Rehabilitation (ICR) [place] location only OR Traditional Cardiac Rehabilitation (TCR) *If criteria for ICR are not met will enroll in TCR [facility] only): Yes      Allergies as of 04/06/2022       Reactions   Sunflower Oil Anaphylaxis, Itching   Bactrim [sulfamethoxazole-trimethoprim] Itching   Pineapple Itching   throat   Tetracycline Itching            Medication List     STOP taking these medications    triamterene-hydrochlorothiazide 37.5-25 MG tablet Commonly known as: MAXZIDE-25       TAKE these medications    albuterol 108 (90 Base) MCG/ACT inhaler Commonly known as: VENTOLIN HFA Inhale into the lungs every 6 (six) hours as needed for wheezing or shortness of breath.   aspirin 81 MG chewable tablet Chew 1 tablet (81 mg total) by mouth daily. Start taking on: April 07, 2022   atorvastatin 80 MG tablet Commonly known as: LIPITOR Take 1 tablet (80 mg total) by mouth every evening.   Brilinta 90 MG Tabs tablet Generic drug: ticagrelor Take 1 tablet (90 mg total) by mouth 2 (two) times daily.   carvedilol 3.125 MG tablet  Commonly known as: COREG Take 1 tablet (3.125 mg total) by mouth 2 (two) times daily with a meal.   cholecalciferol 25 MCG (1000 UNIT) tablet Commonly known as: VITAMIN D3 Take by mouth daily.   dextromethorphan-guaiFENesin 10-100 MG/5ML liquid Commonly known as: ROBITUSSIN-DM Take 5 mLs by mouth every 4 (four) hours as needed for cough.   fluticasone 220  MCG/ACT inhaler Commonly known as: FLOVENT HFA Inhale into the lungs 2 (two) times daily as needed.   furosemide 20 MG tablet Commonly known as: LASIX Take 1 tablet (20 mg total) by mouth daily. Start taking on: April 07, 2022   lansoprazole 15 MG capsule Commonly known as: PREVACID Take 15 mg by mouth daily at 12 noon.   levothyroxine 100 MCG tablet Commonly known as: SYNTHROID Take 100 mcg by mouth daily.   losartan 25 MG tablet Commonly known as: COZAAR Take 1 tablet (25 mg total) by mouth daily. Start taking on: April 07, 2022   montelukast 10 MG tablet Commonly known as: SINGULAIR Take 10 mg by mouth at bedtime.   niacinamide 500 MG tablet Take 500 mg by mouth daily.   nitroGLYCERIN 0.4 MG SL tablet Commonly known as: NITROSTAT Place 1 tablet (0.4 mg total) under the tongue every 5 (five) minutes as needed for up to 15 days for chest pain.   potassium chloride SA 20 MEQ tablet Commonly known as: KLOR-CON M Take 1 tablet (20 mEq total) by mouth 2 (two) times daily for 15 days.   Turmeric Curcumin 500 MG Caps Take by mouth daily.               Durable Medical Equipment  (From admission, onward)           Start     Ordered   04/05/22 1334  For home use only DME Vest life vest  Once       Comments: VT 150 bpm VF 200 bpm 150J x 5 Length of need: 3 months Start date:12/6/20223   04/05/22 1334            Subjective   Pt reports feeling well today. She is ambulating without chest pain or shortness of breath. She has slept very well here but hates the food so is happy about going home today.  All questions and concerns were addressed at time of discharge.   Objective  Blood pressure (!) 105/45, pulse (!) 58, temperature 97.6 F (36.4 C), temperature source Oral, resp. rate 16, height _0  (1.6 m), weight 74.1 kg, SpO2 99 %.   General: Pt is alert, awake, not in acute distress Cardiovascular: RRR, S1/S2 +, no rubs, no  gallops Respiratory: CTA bilaterally, no wheezing, no rhonchi Abdominal: Soft, NT, ND, bowel sounds + Extremities: no edema, no cyanosis  The results of significant diagnostics from this hospitalization (including imaging, microbiology, ancillary and laboratory) are listed below for reference.   Imaging studies: ECHOCARDIOGRAM COMPLETE  Result Date: 04/04/2022    ECHOCARDIOGRAM REPORT   Patient Name:   [facility] Date of Exam: 04/04/2022 Medical Rec #:  [id number]              Height:       63.0 in Accession #:    [id number]             Weight:       163.4 lb Date of Birth:  July 28, 1945              BSA:  1.774 m Patient Age:    72 years               BP:           112/70 mmHg Patient Gender: F                      HR:           61 bpm. Exam Location:  [facility] Procedure: 2D Echo, Color Doppler, Cardiac Doppler and Intracardiac            Opacification Agent Indications:     I21.9 Acute myocardial infarction, unspecified  History:         Patient has no prior history of Echocardiogram examinations.                  Risk Factors:Hypertension.  Sonographer:     Charmayne Sheer Referring Phys:  [id number] A ARIDA Diagnosing Phys: Nelva Bush MD  Sonographer Comments: Suboptimal apical window and no subcostal window. IMPRESSIONS  1. Left ventricular ejection fraction, by estimation, is 25 to 30%. The left ventricle has severely decreased function. The left ventricle demonstrates regional wall motion abnormalities (see scoring diagram/findings for description). Left ventricular diastolic parameters are consistent with Grade I diastolic dysfunction (impaired relaxation).  2. Right ventricular systolic function is mildly reduced with akinesis of the apex. The right ventricular size is moderately enlarged.  3. The mitral valve is normal in structure. Mild mitral valve regurgitation.  4. The aortic valve has an indeterminant number of cusps. There is mild calcification of the aortic valve. There is  moderate thickening of the aortic valve. Aortic valve regurgitation is not visualized. Mild aortic valve stenosis. Aortic valve area, by VTI measures 1.29 cm. Aortic valve mean gradient measures 5.0 mmHg. FINDINGS  Left Ventricle: Left ventricular ejection fraction, by estimation, is 25 to 30%. The left ventricle has severely decreased function. The left ventricle demonstrates regional wall motion abnormalities. Definity contrast agent was given IV to delineate the left ventricular endocardial borders. The left ventricular internal cavity size was normal in size. There is no left ventricular hypertrophy. Left ventricular diastolic parameters are consistent with Grade I diastolic dysfunction (impaired relaxation).  LV Wall Scoring: The mid and distal anterior septum, apical lateral segment, mid inferoseptal segment, apical anterior segment, and apical inferior segment are akinetic. The mid inferolateral segment, mid anterolateral segment, mid anterior segment, and mid inferior segment are hypokinetic. The basal anteroseptal segment, basal inferolateral segment, basal anterolateral segment, basal anterior segment, basal inferior segment, and basal inferoseptal segment are normal. Right Ventricle: The right ventricular size is moderately enlarged. No increase in right ventricular wall thickness. Right ventricular systolic function is mildly reduced with akinesis of the apex. Left Atrium: Left atrial size was normal in size. Right Atrium: Right atrial size was not well visualized. Pericardium: The pericardium was not well visualized. Mitral Valve: The mitral valve is normal in structure. Mild mitral valve regurgitation. Tricuspid Valve: The tricuspid valve is grossly normal. Tricuspid valve regurgitation is mild. Aortic Valve: The aortic valve has an indeterminant number of cusps. There is mild calcification of the aortic valve. There is moderate thickening of the aortic valve. Aortic valve regurgitation is not  visualized. Mild aortic stenosis is present. Aortic valve mean gradient measures 5.0 mmHg. Aortic valve peak gradient measures 11.0 mmHg. Aortic valve area, by VTI measures 1.29 cm. Pulmonic Valve: The pulmonic valve was not well visualized. Pulmonic valve regurgitation is not visualized. No evidence  of pulmonic stenosis. Aorta: The aortic root is normal in size and structure. Pulmonary Artery: The pulmonary artery is not well seen. Venous: The inferior vena cava was not well visualized. IAS/Shunts: The interatrial septum was not well visualized.  LEFT VENTRICLE PLAX 2D LVIDd:         4.80 cm   Diastology LVIDs:         3.60 cm   LV e' medial:    6.09 cm/s LV PW:         0.80 cm   LV E/e' medial:  12.1 LV IVS:        0.90 cm   LV e' lateral:   4.90 cm/s LVOT diam:     2.00 cm   LV E/e' lateral: 15.1 LV SV:         41 LV SV Index:   23 LVOT Area:     3.14 cm  LEFT ATRIUM           Index LA diam:      3.30 cm 1.86 cm/m LA Vol (A4C): 30.0 ml 16.91 ml/m  AORTIC VALVE                     PULMONIC VALVE AV Area (Vmax):    1.38 cm      PV Vmax:       0.90 m/s AV Area (Vmean):   1.37 cm      PV Vmean:      62.900 cm/s AV Area (VTI):     1.29 cm      PV VTI:        0.176 m AV Vmax:           166.00 cm/s   PV Peak grad:  3.2 mmHg AV Vmean:          106.000 cm/s  PV Mean grad:  2.0 mmHg AV VTI:            0.316 m AV Peak Grad:      11.0 mmHg AV Mean Grad:      5.0 mmHg LVOT Vmax:         72.70 cm/s LVOT Vmean:        46.200 cm/s LVOT VTI:          0.130 m LVOT/AV VTI ratio: 0.41  AORTA Ao Root diam: 2.70 cm MITRAL VALVE               TRICUSPID VALVE MV Area (PHT): 2.95 cm    TR Peak grad:   28.1 mmHg MV Decel Time: 257 msec    TR Vmax:        265.00 cm/s MV E velocity: 73.90 cm/s MV A velocity: 99.00 cm/s  SHUNTS MV E/A ratio:  0.75        Systemic VTI:  0.13 m                            Systemic Diam: 2.00 cm Nelva Bush MD Electronically signed by Nelva Bush MD Signature Date/Time: 04/04/2022/6:04:48 PM     Final    DG Chest Port 1 View  Result Date: 04/03/2022 CLINICAL DATA:  Intermittent chest pain EXAM: PORTABLE CHEST 1 VIEW COMPARISON:  None Available. FINDINGS: Pronounced scoliosis convex to the right. Heart size upper limits of normal. Atherosclerotic calcification of the aorta is visible. The lungs are clear. The vascularity is normal. No effusions. IMPRESSION: No active disease. Pronounced scoliosis  convex to the right. Aortic atherosclerosis. Electronically Signed   By: Nelson Chimes M.D.   On: 04/03/2022 13:57   CARDIAC CATHETERIZATION  Result Date: 04/03/2022   2nd Diag lesion is 60% stenosed.   Mid LAD to Dist LAD lesion is 100% stenosed.   Mid LAD lesion is 40% stenosed.   Dist LAD lesion is 60% stenosed.   Prox RCA lesion is 60% stenosed.   Prox RCA to Mid RCA lesion is 95% stenosed.   A drug-eluting stent was successfully placed using a STENT ONYX FRONTIER 2.25X38.   A drug-eluting stent was successfully placed using a STENT ONYX FRONTIER 3.0X38.   Post intervention, there is a 0% residual stenosis.   Post intervention, there is a 0% residual stenosis.   Post intervention, there is a 0% residual stenosis.   There is moderate left ventricular systolic dysfunction.   LV end diastolic pressure is moderately elevated. 1.  Anterior ST elevation myocardial infarction due to an occluded mid LAD.  In addition, there is severe stenosis in the proximal right coronary artery. 2.  Moderately reduced LV systolic function with an EF of 35% with akinesis of the mid to distal anterior wall, apex and distal inferior wall.  Moderately elevated left ventricular end-diastolic pressure with an LVEDP of 27 mmHg. 3.  Successful angioplasty and drug-eluting stent placement to the mid LAD. 4.  Successful OCT guided PCI and drug-eluting stent placement to the right coronary artery. Recommendations: Dual antiplatelet therapy for at least 12 months. Aggressive treatment of risk factors. I started small dose carvedilol.    Labs: Basic Metabolic Panel: Recent Labs  Lab 04/03/22 0930 04/03/22 1003 04/03/22 1224 04/04/22 0458 04/05/22 0827 04/06/22 0609  NA 133* 136  --  137 134* 136  K 2.9* 3.0*  --  3.7 3.6 3.3*  CL 99 102  --  105 103 106  CO2 24 27  --  _0 GLUCOSE 110* 117*  --  102* 97 87  BUN 19 18  --  15 24* 29*  CREATININE 0.94 0.99  --  0.99 1.22* 1.37*  CALCIUM 9.1 9.3  --  9.3 8.9 8.6*  MG 1.8  --  2.0  --   --   --   PHOS 3.3  --   --   --   --   --    CBC: Recent Labs  Lab 04/03/22 0930 04/03/22 1003 04/04/22 0458 04/06/22 0609  WBC 11.1* 11.8* 9.5 7.8  NEUTROABS 7.8* 8.2*  --   --   HGB 13.2 12.7 12.8 11.4*  HCT 39.8 39.7 39.0 34.8*  MCV 86.5 88.6 87.2 87.2  PLT 252 287 274 276   Microbiology: Results for orders placed or performed during the hospital encounter of 04/03/22  MRSA Next Gen by PCR, Nasal     Status: None   Collection Time: 04/03/22  3:23 PM   Specimen: Nasal Mucosa; Nasal Swab  Result Value Ref Range Status   MRSA by PCR Next Gen NOT DETECTED NOT DETECTED Final    Comment: (NOTE) The GeneXpert MRSA Assay (FDA approved for NASAL specimens only), is one component of a comprehensive MRSA colonization surveillance program. It is not intended to diagnose MRSA infection nor to guide or monitor treatment for MRSA infections. Test performance is not FDA approved in patients less than 74 years old. Performed at [facility], [address], [place], [place] [address]    Time coordinating discharge: Over 30 minutes  Kasra Melvin  Geanie Berlin, MD  [facility] 04/06/2022, 10:19 AM

## 2022-04-05 NOTE — Progress Notes (Signed)
Mobility Specialist - Progress Note   04/05/22 1013  Mobility  Activity Stood at bedside;Dangled on edge of bed;Ambulated with assistance in hallway  Level of Assistance Standby assist, set-up cues, supervision of patient - no hands on  Assistive Device Front wheel walker  Distance Ambulated (ft) 160 ft  Activity Response Tolerated well  Mobility Referral Yes  $Mobility charge 1 Mobility   Pt EOB on RA upon arrival. Pt STS and ambulates 1 lap around NS Supervision with no LOB. Pt returns to bed with needs in reach.   Gretchen Short  Mobility Specialist  04/05/22 10:15 AM

## 2022-04-05 NOTE — Consult Note (Signed)
   Heart Failure Nurse Navigator Note   HFrEF 20 to 30%.  Grade 1 diastolic dysfunction.  Wall motion abnormalities noted.  Right ventricle is moderately enlarged.  Mild mitral regurgitation.  Mild aortic stenosis.  She presented to the emergency room with complaints of chest pain.  STEMI was called she was taken to the cardiac catheterization lab where she received drug-eluting stent to the LAD and RCA.  Comorbidities:  Hypertension Asthma GERD Hypothyroidism Scoliosis   Medications:  Atorvastatin 80 mg daily Aspirin 81 mg daily Coreg 3.125 mg daily with meals Furosemide 20 mg oral Synthroid 100 mcg daily Cozaar 12.5 mg daily Brilinta 90 mg 2 times a day  Labs:  Sodium 134, potassium 3.6, chloride 103, CO2 25, BUN 24, creatinine 1.22, estimated GFR 46. Weight not done, admission weight 74.1 kg Blood pressure 105/45 Intake 360 mL Output not documented   Initial meeting with patient and her daughter Elmyra Ricks who was at the bedside.  Discussed heart failure and what it means.  Explained the function of her left ventricle.  Went over the importance of low-sodium diet, fluid restriction, daily weights and her medications.  She has follow-up in the outpatient heart failure clinic on December 14 at 38 AM.  Was given the living with heart failure teaching booklet, zone magnet, and total on heart failure and low-sodium along with weight chart. Was also given written information about the LifeVest.  They had no further questions.  Pricilla Riffle RN [facility]

## 2022-04-05 NOTE — Progress Notes (Signed)
pt's daughter is concerned after she and pt walked around nursing station x4 times- pts daughter states she seems confused after walking - her VS are stable and pt was able to answer questions appropriately- disoriented to year only  / MD made aware/ will monitor close

## 2022-04-05 NOTE — Progress Notes (Signed)
Mobility Specialist - Progress Note   04/05/22 1012  Mobility  Activity Stood at bedside;Dangled on edge of bed;Ambulated with assistance to bathroom  Level of Assistance Standby assist, set-up cues, supervision of patient - no hands on  Assistive Device Front wheel walker  Distance Ambulated (ft) 10 ft  Activity Response Tolerated well  Mobility Referral Yes  $Mobility charge 1 Mobility   Pt supine in bed on RA upon arrival. Pt completes bed mobility, STS and ambulates to and from restroom Supervision. Pt returns to EOB with needs in reach.   Lisa Hale  Mobility Specialist  04/05/22 10:13 AM

## 2022-04-06 ENCOUNTER — Other Ambulatory Visit: Payer: Self-pay | Admitting: Student in an Organized Health Care Education/Training Program

## 2022-04-06 DIAGNOSIS — E038 Other specified hypothyroidism: Secondary | ICD-10-CM | POA: Diagnosis not present

## 2022-04-06 DIAGNOSIS — I10 Essential (primary) hypertension: Secondary | ICD-10-CM | POA: Diagnosis not present

## 2022-04-06 DIAGNOSIS — I2102 ST elevation (STEMI) myocardial infarction involving left anterior descending coronary artery: Secondary | ICD-10-CM | POA: Diagnosis not present

## 2022-04-06 DIAGNOSIS — E782 Mixed hyperlipidemia: Secondary | ICD-10-CM

## 2022-04-06 DIAGNOSIS — I213 ST elevation (STEMI) myocardial infarction of unspecified site: Secondary | ICD-10-CM

## 2022-04-06 LAB — CBC
HCT: 34.8 % — ABNORMAL LOW (ref 36.0–46.0)
Hemoglobin: 11.4 g/dL — ABNORMAL LOW (ref 12.0–15.0)
MCH: 28.6 pg (ref 26.0–34.0)
MCHC: 32.8 g/dL (ref 30.0–36.0)
MCV: 87.2 fL (ref 80.0–100.0)
Platelets: 276 10*3/uL (ref 150–400)
RBC: 3.99 MIL/uL (ref 3.87–5.11)
RDW: 15.7 % — ABNORMAL HIGH (ref 11.5–15.5)
WBC: 7.8 10*3/uL (ref 4.0–10.5)
nRBC: 0 % (ref 0.0–0.2)

## 2022-04-06 LAB — BASIC METABOLIC PANEL
Anion gap: 8 (ref 5–15)
BUN: 29 mg/dL — ABNORMAL HIGH (ref 8–23)
CO2: 22 mmol/L (ref 22–32)
Calcium: 8.6 mg/dL — ABNORMAL LOW (ref 8.9–10.3)
Chloride: 106 mmol/L (ref 98–111)
Creatinine, Ser: 1.37 mg/dL — ABNORMAL HIGH (ref 0.44–1.00)
GFR, Estimated: 40 mL/min — ABNORMAL LOW (ref >60)
Glucose, Bld: 87 mg/dL (ref 70–99)
Potassium: 3.3 mmol/L — ABNORMAL LOW (ref 3.5–5.1)
Sodium: 136 mmol/L (ref 135–145)

## 2022-04-06 MED ORDER — ASPIRIN 81 MG PO CHEW: 81.0000 mg | CHEWABLE_TABLET | Freq: Every day | ORAL | 0 refills | Status: AC

## 2022-04-06 MED ORDER — FUROSEMIDE 20 MG PO TABS: 20.0000 mg | ORAL_TABLET | Freq: Every day | ORAL | 0 refills | Status: DC

## 2022-04-06 MED ORDER — POTASSIUM CHLORIDE CRYS ER 20 MEQ PO TBCR: 20.0000 meq | EXTENDED_RELEASE_TABLET | Freq: Two times a day (BID) | ORAL | 0 refills | Status: DC

## 2022-04-06 MED ORDER — ATORVASTATIN CALCIUM 80 MG PO TABS: 80.0000 mg | ORAL_TABLET | Freq: Every evening | ORAL | 0 refills | Status: DC

## 2022-04-06 MED ORDER — LOSARTAN POTASSIUM 25 MG PO TABS: 25.0000 mg | ORAL_TABLET | Freq: Every day | ORAL | 0 refills | Status: DC

## 2022-04-06 MED ORDER — CARVEDILOL 3.125 MG PO TABS: 3.1250 mg | ORAL_TABLET | Freq: Two times a day (BID) | ORAL | 0 refills | Status: DC

## 2022-04-06 MED ORDER — POTASSIUM CHLORIDE CRYS ER 20 MEQ PO TBCR
40.0000 meq | EXTENDED_RELEASE_TABLET | Freq: Two times a day (BID) | ORAL | Status: DC
  Administered 2022-04-06: 40 meq via ORAL
  Filled 2022-04-06: qty 2

## 2022-04-06 MED ORDER — NITROGLYCERIN 0.4 MG SL SUBL: 0.4000 mg | SUBLINGUAL_TABLET | SUBLINGUAL | 0 refills | Status: DC | PRN

## 2022-04-06 NOTE — Progress Notes (Signed)
Plan for pt to be discharged this morning.  Pt's daughter Elmyra Ricks updated via telephone by this RN.  Daughter will be here within an hour to go over DC instructions and bring pt home

## 2022-04-06 NOTE — Progress Notes (Addendum)
Discharge instructions and med list reviewed with pt and daughter Elmyra Ricks.  Also reviewed S&S of CHF, CP, and when to seek medical attention.  Both verbalize understanding.  Daughter picked up the brillinta RX yesterday.  Pt was educated and fitted for the [employer] vest by the representative earlier today.  Pt had no IV access.  Tele removed.  Pt to F/u with PCP, CHF clinic, and cardiology.   Volunteer services notified for a [facility] for discharge

## 2022-04-06 NOTE — Discharge Instructions (Signed)
Please follow up with cardiology as directed Please return to the ED if you experience any of the heart attack warning signs listed in the attached information sheet.

## 2022-04-06 NOTE — Progress Notes (Signed)
Progress Note  Patient Name: Lisa Hale Date of Encounter: 04/06/2022  Primary Cardiologist: New - consult by Fletcher Anon  Subjective   No angina or dyspnea. Has ambulated without significant issues. LifeVest in place. Vitals stable.   Inpatient Medications    Scheduled Meds:  aspirin  81 mg Oral Daily   atorvastatin  80 mg Oral QPM   carvedilol  3.125 mg Oral BID WC   Chlorhexidine Gluconate Cloth  6 each Topical Daily   cholecalciferol  1,000 Units Oral Daily   Coenzyme Q-10  100 mg Oral Daily   enoxaparin (LOVENOX) injection  40 mg Subcutaneous Q24H   furosemide  20 mg Oral Daily   levothyroxine  100 mcg Oral Q0600   losartan  25 mg Oral Daily   montelukast  10 mg Oral QHS   niacin  500 mg Oral Daily   pantoprazole  40 mg Oral Daily   potassium chloride  40 mEq Oral BID   sodium chloride flush  3 mL Intravenous Q12H   ticagrelor  90 mg Oral BID   Continuous Infusions:  sodium chloride     PRN Meds: sodium chloride, albuterol, dextromethorphan-guaiFENesin, morphine injection, nitroGLYCERIN, ondansetron (ZOFRAN) IV, mouth rinse, sodium chloride flush   Vital Signs    Vitals:   04/05/22 1937 04/05/22 2358 04/06/22 0333 04/06/22 0749  BP: (!) 126/53 (!) 101/49 (!) 122/53 123/62  Pulse: 62 67 64 75  Resp: '19 18 18 16  '$ Temp: 99.1 F (37.3 C) 99.3 F (37.4 C) 98.3 F (36.8 C) 98 F (36.7 C)  TempSrc: Oral Oral    SpO2: 98% 95% 97% 97%  Weight:      Height:        Intake/Output Summary (Last 24 hours) at 04/06/2022 1055 Last data filed at 04/05/2022 1405 Gross per 24 hour  Intake 240 ml  Output --  Net 240 ml   Filed Weights   04/03/22 1203  Weight: 74.1 kg    Telemetry    SR with rare PVC and artifact - Personally Reviewed  ECG    No new tracings - Personally Reviewed  Physical Exam   GEN: No acute distress. LifeVest in place.  Neck: No JVD. Cardiac: RRR, I/VI systolic murmur RUSB, no rubs, or gallops.  Respiratory: Clear to  auscultation bilaterally.  GI: Soft, nontender, non-distended.   MS: No edema; No deformity. Neuro:  Alert and oriented x 3; Nonfocal.  Psych: Normal affect.  Labs    Chemistry Recent Labs  Lab 04/03/22 1003 04/04/22 0458 04/05/22 0827 04/06/22 0609  NA 136 137 134* 136  K 3.0* 3.7 3.6 3.3*  CL 102 105 103 106  CO2 '27 23 25 22  '$ GLUCOSE 117* 102* 97 87  BUN 18 15 24* 29*  CREATININE 0.99 0.99 1.22* 1.37*  CALCIUM 9.3 9.3 8.9 8.6*  PROT 7.5 7.6 7.2  --   ALBUMIN 3.6 3.5 3.3*  --   AST 333* 264* 111*  --   ALT 60* 62* 40  --   ALKPHOS 80 76 76  --   BILITOT 0.6 1.6* 1.3*  --   GFRNONAA 59* 59* 46* 40*  ANIONGAP '7 9 6 8     '$ Hematology Recent Labs  Lab 04/03/22 1003 04/04/22 0458 04/06/22 0609  WBC 11.8* 9.5 7.8  RBC 4.48 4.47 3.99  HGB 12.7 12.8 11.4*  HCT 39.7 39.0 34.8*  MCV 88.6 87.2 87.2  MCH 28.3 28.6 28.6  MCHC 32.0 32.8 32.8  RDW 15.7*  15.8* 15.7*  PLT 287 274 276    Cardiac EnzymesNo results for input(s): "TROPONINI" in the last 168 hours. No results for input(s): "TROPIPOC" in the last 168 hours.   BNPNo results for input(s): "BNP", "PROBNP" in the last 168 hours.   DDimer No results for input(s): "DDIMER" in the last 168 hours.   Radiology    No results found.  Cardiac Studies   2D echo 04/04/2022: 1. Left ventricular ejection fraction, by estimation, is 25 to 30%. The  left ventricle has severely decreased function. The left ventricle  demonstrates regional wall motion abnormalities (see scoring  diagram/findings for description). Left ventricular  diastolic parameters are consistent with Grade I diastolic dysfunction  (impaired relaxation).   2. Right ventricular systolic function is mildly reduced with akinesis of  the apex. The right ventricular size is moderately enlarged.   3. The mitral valve is normal in structure. Mild mitral valve  regurgitation.   4. The aortic valve has an indeterminant number of cusps. There is mild   calcification of the aortic valve. There is moderate thickening of the  aortic valve. Aortic valve regurgitation is not visualized. Mild aortic  valve stenosis. Aortic valve area, by  VTI measures 1.29 cm. Aortic valve mean gradient measures 5.0 mmHg.  __________  LHC 04/03/2022:   2nd Diag lesion is 60% stenosed.   Mid LAD to Dist LAD lesion is 100% stenosed.   Mid LAD lesion is 40% stenosed.   Dist LAD lesion is 60% stenosed.   Prox RCA lesion is 60% stenosed.   Prox RCA to Mid RCA lesion is 95% stenosed.   A drug-eluting stent was successfully placed using a STENT ONYX FRONTIER 2.25X38.   A drug-eluting stent was successfully placed using a STENT ONYX FRONTIER 3.0X38.   Post intervention, there is a 0% residual stenosis.   Post intervention, there is a 0% residual stenosis.   Post intervention, there is a 0% residual stenosis.   There is moderate left ventricular systolic dysfunction.   LV end diastolic pressure is moderately elevated.   1.  Anterior ST elevation myocardial infarction due to an occluded mid LAD.  In addition, there is severe stenosis in the proximal right coronary artery. 2.  Moderately reduced LV systolic function with an EF of 35% with akinesis of the mid to distal anterior wall, apex and distal inferior wall.  Moderately elevated left ventricular end-diastolic pressure with an LVEDP of 27 mmHg. 3.  Successful angioplasty and drug-eluting stent placement to the mid LAD. 4.  Successful OCT guided PCI and drug-eluting stent placement to the right coronary artery.   Recommendations: Dual antiplatelet therapy for at least 12 months. Aggressive treatment of risk factors. I started small dose carvedilol.  Patient Profile     76 y.o. female with history of HTN, hypothyroidism, scoliosis, and GERD who we are seeing for an anterior STEMI and acute HFrEF.   Assessment & Plan    1. Anterior STEMI: -No symptoms of angina or decompensation  -Status post PCI/DES to  the mid LAD and RCA -Continue DAPT ASA 81 mg daily and Brilinta 90 mg bid without interruption for a minimum of 12 months from date of PCI -Coreg, losartan, Lipitor  -Post cath instructions -Cardiac rehab  2. Acute HFrEF secondary to ICM: -Euvolemic and well compensated -Coreg, losartan, and Lasix -LifeVest in place -Escalate GDMT with transition from ARB to ARNI, addition of MRA and SGLT2i and titration of beta blocker as tolerated moving forward  -Plan  to follow up echo in the office in 3 months time following PCI and optimization of GDMT to re-evaluate LV systolic function -If her EF remains < 35% at that time, she will need to continue LifeVest and be evaluated by EP for consideration of ICD -CHF education   3. HTN: -Blood pressure well controlled -Continue medications as outlined above  4. HLD: -LDL 201 -Goal LDL < 55 -Now on Lipitor 80 mg -Follow up FLP and LFT in the office with recommendation to escalate lipid therapy as indicated to achieve target LDL   5. Elevated LFTs: -Likely in the setting of MI -Hepatitis panel unrevealing  -Follow up in the office     For questions or updates, please contact [facility] Please consult [email or web address] for contact info under Cardiology/STEMI.    Signed, Christell Faith, PA-C [place] Pager: [phone] 04/06/2022, 10:55 AM

## 2022-04-12 ENCOUNTER — Telehealth: Payer: Self-pay

## 2022-04-12 NOTE — Telephone Encounter (Unsigned)
   Heart Failure Nurse [facility] Call  Called and spoke with the patient Lisa Hale, as there was some confusion about her appointment in the outpatient heart failure clinic for tomorrow.  Reminded that her appointment is at 9 AM in the medical arts [facility] [address].  Then remembered being told that at discharge.  I also called the patient's daughter, Lisa Hale, there was no answer but I left a message on the machine that she did have a appointment in the outpatient heart failure clinic at 9 AM, I explained also that they could use valet parking if they pulled up to the main entrance of the medical arts building.  I also left my number if she had questions to call back.  Pricilla Riffle RN CHFN

## 2022-04-13 ENCOUNTER — Ambulatory Visit: Payer: Medicare Other | Attending: Family | Admitting: Family

## 2022-04-13 ENCOUNTER — Other Ambulatory Visit (HOSPITAL_COMMUNITY): Payer: Self-pay

## 2022-04-13 ENCOUNTER — Encounter: Payer: Self-pay | Admitting: Family

## 2022-04-13 VITALS — BP 111/57 | HR 61 | Resp 18 | Wt 156.1 lb

## 2022-04-13 DIAGNOSIS — I252 Old myocardial infarction: Secondary | ICD-10-CM | POA: Insufficient documentation

## 2022-04-13 DIAGNOSIS — K219 Gastro-esophageal reflux disease without esophagitis: Secondary | ICD-10-CM | POA: Diagnosis not present

## 2022-04-13 DIAGNOSIS — I10 Essential (primary) hypertension: Secondary | ICD-10-CM

## 2022-04-13 DIAGNOSIS — Z79899 Other long term (current) drug therapy: Secondary | ICD-10-CM | POA: Insufficient documentation

## 2022-04-13 DIAGNOSIS — M419 Scoliosis, unspecified: Secondary | ICD-10-CM | POA: Diagnosis not present

## 2022-04-13 DIAGNOSIS — I5020 Unspecified systolic (congestive) heart failure: Secondary | ICD-10-CM | POA: Diagnosis not present

## 2022-04-13 DIAGNOSIS — I110 Hypertensive heart disease with heart failure: Secondary | ICD-10-CM | POA: Insufficient documentation

## 2022-04-13 DIAGNOSIS — J45909 Unspecified asthma, uncomplicated: Secondary | ICD-10-CM | POA: Diagnosis not present

## 2022-04-13 DIAGNOSIS — Z87891 Personal history of nicotine dependence: Secondary | ICD-10-CM | POA: Insufficient documentation

## 2022-04-13 DIAGNOSIS — I2102 ST elevation (STEMI) myocardial infarction involving left anterior descending coronary artery: Secondary | ICD-10-CM

## 2022-04-13 DIAGNOSIS — I5022 Chronic systolic (congestive) heart failure: Secondary | ICD-10-CM | POA: Insufficient documentation

## 2022-04-13 DIAGNOSIS — I2510 Atherosclerotic heart disease of native coronary artery without angina pectoris: Secondary | ICD-10-CM | POA: Diagnosis not present

## 2022-04-13 LAB — BASIC METABOLIC PANEL
Anion gap: 4 — ABNORMAL LOW (ref 5–15)
BUN: 30 mg/dL — ABNORMAL HIGH (ref 8–23)
CO2: 21 mmol/L — ABNORMAL LOW (ref 22–32)
Calcium: 9.1 mg/dL (ref 8.9–10.3)
Chloride: 113 mmol/L — ABNORMAL HIGH (ref 98–111)
Creatinine, Ser: 1.37 mg/dL — ABNORMAL HIGH (ref 0.44–1.00)
GFR, Estimated: 40 mL/min — ABNORMAL LOW (ref >60)
Glucose, Bld: 111 mg/dL — ABNORMAL HIGH (ref 70–99)
Potassium: 4.2 mmol/L (ref 3.5–5.1)
Sodium: 138 mmol/L (ref 135–145)

## 2022-04-13 NOTE — Progress Notes (Signed)
[place] [facility] FAIL[facility] - PHARMACIST COUNSELING NOTE  Guideline-Directed Medical Therapy/Evidence Based Medicine  ACE/ARB/ARNI: Losartan 25 mg daily Beta Blocker: Carvedilol 3.125 mg twice daily Aldosterone Antagonist:  None.  Diuretic: Furosemide 20 mg daily SGLT2i:  None.   Adherence Assessment  Do you ever forget to take your medication? '[]'$ Yes '[x]'$ No  Do you ever skip doses due to side effects? '[]'$ Yes '[x]'$ No  Do you have trouble affording your medicines? '[]'$ Yes '[x]'$ No  Are you ever unable to pick up your medication due to transportation difficulties? '[]'$ Yes '[x]'$ No  Do you ever stop taking your medications because you don't believe they are helping? '[]'$ Yes '[x]'$ No  Do you check your weight daily? '[]'$ Yes '[x]'$ No   Adherence strategy: Daughter is to help step up patient's medications.   Barriers to obtaining medications: None at the moment but cost may be an issue. Entresto co-pay is $336.67. Wilder Glade co-pay is $318.03. Vania Rea co-pay is $323.12. - probably in coverage gap.   Vital signs: HR 61, BP 111/57, weight (pounds) 156 ECHO: Date 03/2022, EF 25-30, notes The left ventricle has severely decreased function. The left ventricle demonstrates regional wall motion abnormalities (see scoring  diagram/findings for description). Left ventricular diastolic parameters are consistent with Grade I diastolic dysfunction (impaired relaxation).  Cath: Date 04/03/2022,  1.  Anterior ST elevation myocardial infarction due to an occluded mid LAD.  In addition, there is severe stenosis in the proximal right coronary artery. 2.  Moderately reduced LV systolic function with an EF of 35% with akinesis of the mid to distal anterior wall, apex and distal inferior wall.  Moderately elevated left ventricular end-diastolic pressure with an LVEDP of 27 mmHg. 3.  Successful angioplasty and drug-eluting stent placement to the mid LAD. 4.  Successful OCT guided PCI and drug-eluting stent  placement to the right coronary artery.     Latest Ref Rng & Units 04/06/2022    6:09 AM 04/05/2022    8:27 AM 04/04/2022    4:58 AM  BMP  Glucose 70 - 99 mg/dL 87  97  102   BUN 8 - 23 mg/dL '29  24  15   '$ Creatinine 0.44 - 1.00 mg/dL 1.37  1.22  0.99   Sodium 135 - 145 mmol/L 136  134  137   Potassium 3.5 - 5.1 mmol/L 3.3  3.6  3.7   Chloride 98 - 111 mmol/L 106  103  105   CO2 22 - 32 mmol/L '22  25  23   '$ Calcium 8.9 - 10.3 mg/dL 8.6  8.9  9.3     Past Medical History:  Diagnosis Date   Asthma    CHF (congestive heart failure) (HCC)    Coronary artery disease    GERD (gastroesophageal reflux disease)    Glaucoma    Hypertension    Hypothyroidism    Scoliosis    Seasonal allergies    Skin cancer    Vertigo    Avg: 1x/mo    ASSESSMENT 76 year old female who presents to the HF clinic new patient appointment. Patient did have some delirium post hospitalization but per daughter pt is doing a lot better in the last day or so. Some complaint of weakness and fatigue. We discussed the reason for all of the medications and the plan for titration and adding on more medications in the future. Patient might still be taking triamterene-hctz. Daughter is to ensure that is taken out of her medications. Scr continued to trend up at  discharge and potassium was 3.3.   Recent ED Visit (past 6 months): Date - 04/03/2022, [facility] - Code STEMI  PLAN CHF/HTN: Continue current medications (coreg, losartan, and lasix). Recommend checking new labs. If labs are stable and BP is stable at next visit, we can potentially add on SGLT2 or MRA. The cost of SGLT2 currently is high as patient might be in the donut hole. F/u in the new year with cost. BP is running in the 110, but this may be due to her continuing to take triamterene-hctz. Patient was informed to stop taking triamterene-hctz.   Limit salt and water intake. Daily weights. Call the clinic is you gain > 2-3 lb in one night or > 5 lb in 1 week.    STEMI: Continue ASA, ticagrelor, atorvastatin, coreg and losartan.    Time spent: 45 minutes  Oswald Hillock, Pharm.D. Clinical Pharmacist 04/13/2022 9:57 AM    Current Outpatient Medications:    albuterol (PROVENTIL HFA;VENTOLIN HFA) 108 (90 Base) MCG/ACT inhaler, Inhale into the lungs every 6 (six) hours as needed for wheezing or shortness of breath., Disp: , Rfl:    aspirin 81 MG chewable tablet, Chew 1 tablet (81 mg total) by mouth daily., Disp: 30 tablet, Rfl: 0   atorvastatin (LIPITOR) 80 MG tablet, Take 1 tablet (80 mg total) by mouth every evening., Disp: 30 tablet, Rfl: 0   carvedilol (COREG) 3.125 MG tablet, Take 1 tablet (3.125 mg total) by mouth 2 (two) times daily with a meal., Disp: 60 tablet, Rfl: 0   cholecalciferol 25 MCG (1000 UT) TABS, Take by mouth daily., Disp: , Rfl:    co-enzyme Q-10 30 MG capsule, Take 100 mg by mouth daily. Takes with atorvastatin., Disp: , Rfl:    dextromethorphan-guaiFENesin (ROBITUSSIN-DM) 10-100 MG/5ML liquid, Take 5 mLs by mouth every 4 (four) hours as needed for cough., Disp: , Rfl:    fluticasone (FLOVENT HFA) 220 MCG/ACT inhaler, Inhale into the lungs 2 (two) times daily as needed., Disp: , Rfl:    furosemide (LASIX) 20 MG tablet, Take 1 tablet (20 mg total) by mouth daily., Disp: 30 tablet, Rfl: 0   lansoprazole (PREVACID) 15 MG capsule, Take 15 mg by mouth daily. Take around 8- 9am., Disp: , Rfl:    levothyroxine (SYNTHROID) 100 MCG tablet, Take 100 mcg by mouth daily., Disp: , Rfl:    losartan (COZAAR) 25 MG tablet, Take 1 tablet (25 mg total) by mouth daily., Disp: 30 tablet, Rfl: 0   montelukast (SINGULAIR) 10 MG tablet, Take 10 mg by mouth at bedtime., Disp: , Rfl:    niacinamide 500 MG tablet, Take 500 mg by mouth daily., Disp: , Rfl:    nitroGLYCERIN (NITROSTAT) 0.4 MG SL tablet, Place 1 tablet (0.4 mg total) under the tongue every 5 (five) minutes as needed for up to 15 days for chest pain., Disp: 15 tablet, Rfl: 0   potassium  chloride SA (KLOR-CON M) 20 MEQ tablet, Take 1 tablet (20 mEq total) by mouth 2 (two) times daily for 15 days., Disp: 30 tablet, Rfl: 0   ticagrelor (BRILINTA) 90 MG TABS tablet, Take 1 tablet (90 mg total) by mouth 2 (two) times daily., Disp: 60 tablet, Rfl: 0   Turmeric Curcumin 500 MG CAPS, Take by mouth daily., Disp: , Rfl:    COUNSELING POINTS/CLINICAL PEARLS    DRUGS TO CAUTION IN HEART FAILURE  Drug or Class Mechanism  Analgesics NSAIDs COX-2 inhibitors Glucocorticoids  Sodium and water retention, increased systemic vascular resistance,  decreased response to diuretics   Diabetes Medications Metformin Thiazolidinediones Rosiglitazone (Avandia) Pioglitazone (Actos) DPP4 Inhibitors Saxagliptin (Onglyza) Sitagliptin (Januvia)   Lactic acidosis Possible calcium channel blockade   Unknown  Antiarrhythmics Class I  Flecainide Disopyramide Class III Sotalol Other Dronedarone  Negative inotrope, proarrhythmic   Proarrhythmic, beta blockade  Negative inotrope  Antihypertensives Alpha Blockers Doxazosin Calcium Channel Blockers Diltiazem Verapamil Nifedipine Central Alpha Adrenergics Moxonidine Peripheral Vasodilators Minoxidil  Increases renin and aldosterone  Negative inotrope    Possible sympathetic withdrawal  Unknown  Anti-infective Itraconazole Amphotericin B  Negative inotrope Unknown  Hematologic Anagrelide Cilostazol   Possible inhibition of PD IV Inhibition of PD III causing arrhythmias  Neurologic/Psychiatric Stimulants Anti-Seizure Drugs Carbamazepine Pregabalin Antidepressants Tricyclics Citalopram Parkinsons Bromocriptine Pergolide Pramipexole Antipsychotics Clozapine Antimigraine Ergotamine Methysergide Appetite suppressants Bipolar Lithium  Peripheral alpha and beta agonist activity  Negative inotrope and chronotrope Calcium channel blockade  Negative inotrope, proarrhythmic Dose-dependent QT  prolongation  Excessive serotonin activity/valvular damage Excessive serotonin activity/valvular damage Unknown  IgE mediated hypersensitivy, calcium channel blockade  Excessive serotonin activity/valvular damage Excessive serotonin activity/valvular damage Valvular damage  Direct myofibrillar degeneration, adrenergic stimulation  Antimalarials Chloroquine Hydroxychloroquine Intracellular inhibition of lysosomal enzymes  Urologic Agents Alpha Blockers Doxazosin Prazosin Tamsulosin Terazosin  Increased renin and aldosterone  Adapted from Page Carleene Overlie, et al. "Drugs That May Cause or Exacerbate Heart Failure: A Scientific Statement from the American Heart  Association." Circulation 2016; 134:e32-e69. DOI: 10.1161/CIR.0000000000000426   MEDICATION ADHERENCES TIPS AND STRATEGIES Taking medication as prescribed improves patient outcomes in heart failure (reduces hospitalizations, improves symptoms, increases survival) Side effects of medications can be managed by decreasing doses, switching agents, stopping drugs, or adding additional therapy. Please let someone in the [facility] know if you have having bothersome side effects so we can modify your regimen. Do not alter your medication regimen without talking to [place].  Medication reminders can help patients remember to take drugs on time. If you are missing or forgetting doses you can try linking behaviors, using pill boxes, or an electronic reminder like an alarm on your phone or an app. Some people can also get automated phone calls as medication reminders.

## 2022-04-13 NOTE — Patient Instructions (Signed)
Continue weighing daily and call for an overnight weight gain of 3 pounds or more or a weekly weight gain of more than 5 pounds.   If you have voicemail, please make sure your mailbox is cleaned out so that we may leave a message and please make sure to listen to any voicemails.     

## 2022-04-13 NOTE — Progress Notes (Signed)
Patient ID: Lisa Hale, female    DOB: 02-17-1946, 76 y.o.   MRN: [id number]  HPI  Ms Feeser is a 76 y/o female with a history of asthma, CAD, HTN, thyroid disease, GERD, scoliosis, previous tobacco use and chronic heart failure.   Echo report from 04/04/22 showed an EF of 25-30% along with mild MR/AS and moderately enlarged right ventricle.   LHC done 04/03/22 showed:   2nd Diag lesion is 60% stenosed.   Mid LAD to Dist LAD lesion is 100% stenosed.   Mid LAD lesion is 40% stenosed.   Dist LAD lesion is 60% stenosed.   Prox RCA lesion is 60% stenosed.   Prox RCA to Mid RCA lesion is 95% stenosed.   A drug-eluting stent was successfully placed using a STENT ONYX FRONTIER 2.25X38.   A drug-eluting stent was successfully placed using a STENT ONYX FRONTIER 3.0X38.   Post intervention, there is a 0% residual stenosis.   Post intervention, there is a 0% residual stenosis.   Post intervention, there is a 0% residual stenosis.   There is moderate left ventricular systolic dysfunction.   LV end diastolic pressure is moderately elevated.  1.  Anterior ST elevation myocardial infarction due to an occluded mid LAD.  In addition, there is severe stenosis in the proximal right coronary artery. 2.  Moderately reduced LV systolic function with an EF of 35% with akinesis of the mid to distal anterior wall, apex and distal inferior wall.  Moderately elevated left ventricular end-diastolic pressure with an LVEDP of 27 mmHg. 3.  Successful angioplasty and drug-eluting stent placement to the mid LAD. 4.  Successful OCT guided PCI and drug-eluting stent placement to the right coronary artery.  Admitted 04/03/22 due to chest pressure x 1 days. Also had discomfort in bilateral arms. Troponin >24000. Cardiology consult obtained. Urgent cath with stents placed. Lifevest placed. Discharged after 3 days.   She presents today for her initial visit with a chief complaint of moderate fatigue with minimal  exertion. She says that she does feel like her energy level is improving though with each day. She has associated shortness of breath, dizziness (with sudden position changes) and easy bruising along with this. She denies any difficulty sleeping, abdominal distention, palpitations, pedal edema, chest pain, cough or weight gain.   Was fitted with lifevest but says that she developed severe hives and blisters on her back that she could no longer tolerate it.   At discharge she was supposed to stop taking triamterene-hydrochlorothiazide but patient isn't sure if she's still taking it as she would leave this medication on her bathroom sink and take it first thing in the morning. Daughter that is present says that she will go to patient's home and check to see if it's in the bathroom and, if so, throw it away.   Past Medical History:  Diagnosis Date   Asthma    CHF (congestive heart failure) (HCC)    Coronary artery disease    GERD (gastroesophageal reflux disease)    Glaucoma    Hypertension    Hypothyroidism    Scoliosis    Seasonal allergies    Skin cancer    Vertigo    Avg: 1x/mo   Past Surgical History:  Procedure Laterality Date   CATARACT EXTRACTION W/PHACO Right 03/13/2022   Procedure: CATARACT EXTRACTION PHACO AND INTRAOCULAR LENS PLACEMENT (IOC) RIGHT WITH HYDRUS MICROSTENT  6.72  00:37.7;  Surgeon: Eulogio Bear, MD;  Location: [place];  Service: Ophthalmology;  Laterality: Right;   CATARACT EXTRACTION W/PHACO Left 03/27/2022   Procedure: CATARACT EXTRACTION PHACO AND INTRAOCULAR LENS PLACEMENT (IOC) LEFT WITH IOL PLUS HYDRUS MICROSTENT 5.21 00:30.9;  Surgeon: Eulogio Bear, MD;  Location: [place];  Service: Ophthalmology;  Laterality: Left;   COLONOSCOPY     COLONOSCOPY WITH PROPOFOL N/A 03/12/2018   Procedure: COLONOSCOPY WITH PROPOFOL;  Surgeon: Virgel Manifold, MD;  Location: [place];  Service: Endoscopy;  Laterality: N/A;    CORONARY/GRAFT ACUTE MI REVASCULARIZATION N/A 04/03/2022   Procedure: Coronary/Graft Acute MI Revascularization;  Surgeon: Wellington Hampshire, MD;  Location: [place] [facility];  Service: Cardiovascular;  Laterality: N/A;   LEFT HEART CATH AND CORONARY ANGIOGRAPHY N/A 04/03/2022   Procedure: LEFT HEART CATH AND CORONARY ANGIOGRAPHY;  Surgeon: Wellington Hampshire, MD;  Location: [place] [facility];  Service: Cardiovascular;  Laterality: N/A;   ORIF TIBIA & FIBULA FRACTURES     TONSILLECTOMY     Family History  Problem Relation Age of Onset   Colon polyps Brother    Social History   Tobacco Use   Smoking status: Former    Types: Cigarettes    Quit date: 1999    Years since quitting: 24.9   Smokeless tobacco: Never   Tobacco comments:    has had coccasional cig since quitting  Substance Use Topics   Alcohol use: Yes    Comment: couple x/yr   Allergies  Allergen Reactions   Sunflower Oil Anaphylaxis and Itching   Bactrim [Sulfamethoxazole-Trimethoprim] Itching   Pineapple Itching    throat   Tetracycline Itching        Prior to Admission medications   Medication Sig Start Date End Date Taking? Authorizing Provider  albuterol (PROVENTIL HFA;VENTOLIN HFA) 108 (90 Base) MCG/ACT inhaler Inhale into the lungs every 6 (six) hours as needed for wheezing or shortness of breath.   Yes [provider]  aspirin 81 MG chewable tablet Chew 1 tablet (81 mg total) by mouth daily. 04/07/22 05/07/22 Yes Richarda Osmond, MD  atorvastatin (LIPITOR) 80 MG tablet Take 1 tablet (80 mg total) by mouth every evening. 04/06/22 05/06/22 Yes Richarda Osmond, MD  carvedilol (COREG) 3.125 MG tablet Take 1 tablet (3.125 mg total) by mouth 2 (two) times daily with a meal. 04/06/22 05/06/22 Yes Richarda Osmond, MD  cholecalciferol 25 MCG (1000 UT) TABS Take by mouth daily.   Yes [provider]  co-enzyme Q-10 30 MG capsule Take 100 mg by mouth daily. Takes with atorvastatin.   Yes [provider]  dextromethorphan-guaiFENesin (ROBITUSSIN-DM) 10-100 MG/5ML liquid Take 5 mLs by mouth every 4 (four) hours as needed for cough.   Yes [provider]  fluticasone (FLOVENT HFA) 220 MCG/ACT inhaler Inhale into the lungs 2 (two) times daily as needed.   Yes [provider]  furosemide (LASIX) 20 MG tablet Take 1 tablet (20 mg total) by mouth daily. 04/07/22 05/07/22 Yes Richarda Osmond, MD  lansoprazole (PREVACID) 15 MG capsule Take 15 mg by mouth daily. Take around 8- 9am.   Yes [provider]  levothyroxine (SYNTHROID) 100 MCG tablet Take 100 mcg by mouth daily. 03/16/22  Yes [provider]  losartan (COZAAR) 25 MG tablet Take 1 tablet (25 mg total) by mouth daily. 04/07/22 05/07/22 Yes Richarda Osmond, MD  montelukast (SINGULAIR) 10 MG tablet Take 10 mg by mouth at bedtime.   Yes [provider]  niacinamide 500 MG tablet  Take 500 mg by mouth daily.   Yes [provider]  nitroGLYCERIN (NITROSTAT) 0.4 MG SL tablet Place 1 tablet (0.4 mg total) under the tongue every 5 (five) minutes as needed for up to 15 days for chest pain. 04/06/22 04/21/22 Yes Richarda Osmond, MD  potassium chloride SA (KLOR-CON M) 20 MEQ tablet Take 1 tablet (20 mEq total) by mouth 2 (two) times daily for 15 days. 04/06/22 04/21/22 Yes Richarda Osmond, MD  ticagrelor (BRILINTA) 90 MG TABS tablet Take 1 tablet (90 mg total) by mouth 2 (two) times daily. 04/05/22  Yes Richarda Osmond, MD  Turmeric Curcumin 500 MG CAPS Take by mouth daily.   Yes [provider]   Review of Systems  Constitutional:  Positive for fatigue (easily but improving).  HENT:  Negative for congestion, postnasal drip and sore throat.   Eyes: Negative.   Respiratory:  Positive for shortness of breath (with moderate exertion). Negative for cough and chest tightness.   Cardiovascular:  Negative for chest pain, palpitations and leg swelling.  Gastrointestinal:   Negative for abdominal distention and abdominal pain.  Endocrine: Negative.   Genitourinary: Negative.   Musculoskeletal:  Negative for back pain and neck pain.  Skin: Negative.   Allergic/Immunologic: Negative.   Neurological:  Positive for dizziness (with sudden position changes). Negative for light-headedness and headaches.  Hematological:  Negative for adenopathy. Bruises/bleeds easily.  Psychiatric/Behavioral:  Negative for dysphoric mood and sleep disturbance (sleeping on 1 pillow). The patient is not nervous/anxious.    Vitals:   04/13/22 0916  BP: (!) 111/57  Pulse: 61  Resp: 18  SpO2: 92%  Weight: 156 lb 2 oz (70.8 kg)   Wt Readings from Last 3 Encounters:  04/13/22 156 lb 2 oz (70.8 kg)  04/03/22 163 lb 5.8 oz (74.1 kg)  03/27/22 159 lb 11.2 oz (72.4 kg)   Lab Results  Component Value Date   CREATININE 1.37 (H) 04/13/2022   CREATININE 1.37 (H) 04/06/2022   CREATININE 1.22 (H) 04/05/2022   Physical Exam Vitals and nursing note reviewed. Exam conducted with a chaperone present (daughter).  Constitutional:      Appearance: Normal appearance.  HENT:     Head: Normocephalic and atraumatic.  Cardiovascular:     Rate and Rhythm: Normal rate and regular rhythm.  Pulmonary:     Effort: Pulmonary effort is normal.     Breath sounds: No wheezing, rhonchi or rales.  Abdominal:     General: There is no distension.     Palpations: Abdomen is soft.     Tenderness: There is no abdominal tenderness.  Musculoskeletal:        General: No tenderness.     Cervical back: Normal range of motion and neck supple.     Right lower leg: No edema.     Left lower leg: No edema.  Skin:    General: Skin is warm and dry.  Neurological:     General: No focal deficit present.     Mental Status: She is alert and oriented to person, place, and time.  Psychiatric:        Mood and Affect: Mood normal.        Behavior: Behavior normal.   Assessment & Plan:  1: Chronic heart failure with  reduced ejection fraction- - NYHA class III - euvolemic today - weighing daily; reminded to call for an overnight weight gain of > 2 pounds or a weekly weight gain of > 5 pounds -  on GDMT of carvedilol and losartan - consider changing losartan to entresto and adding SGLT2 early next year as current copays for these medications are each $300 - keeping daily fluid intake to <64 ounces - not adding salt and has been reading food labels for sodium content - lifevest had been placed but she subsequently had to remove it due to having allergic reaction with blisters on her back - no BNP - PharmD reconciled medications with the patient  2: HTN- - BP looks good (111/57) - sees PCP Clemmie Krill)  - daughter will go home and make sure patient has stopped taking her maxide - BMP 04/06/22 reviewed and showed sodium 136, potassium 3.3, creatinine 1.37 & GFR 40 - recheck BMP today  3: STEMI- - 2 stents placed 04/03/22 - dual antiplatelet therapy for at least 1 year - have scheduled [facility] cardiology f/u on 05/02/22 - troponin on 04/03/22 was >24000 - consider cardiac rehab once she's been out long enough; she's not sure she can commit to the weekly frequently - had previously been quite active walking with friends   Medication bottles reviewed.   Return in 6 weeks, sooner if needed.

## 2022-04-26 ENCOUNTER — Other Ambulatory Visit: Payer: Self-pay

## 2022-05-02 ENCOUNTER — Other Ambulatory Visit
Admission: RE | Admit: 2022-05-02 | Discharge: 2022-05-02 | Disposition: A | Discharge status: Home / Self Care | Payer: Medicare Other | Source: Ambulatory Visit | Attending: Physician Assistant | Admitting: Physician Assistant

## 2022-05-02 ENCOUNTER — Encounter: Payer: Self-pay | Admitting: Physician Assistant

## 2022-05-02 ENCOUNTER — Other Ambulatory Visit: Payer: Self-pay

## 2022-05-02 ENCOUNTER — Ambulatory Visit: Payer: Medicare Other | Attending: Physician Assistant | Admitting: Physician Assistant

## 2022-05-02 VITALS — BP 118/64 | HR 65 | Ht 63.0 in | Wt 152.0 lb

## 2022-05-02 DIAGNOSIS — R7989 Other specified abnormal findings of blood chemistry: Secondary | ICD-10-CM

## 2022-05-02 DIAGNOSIS — I2510 Atherosclerotic heart disease of native coronary artery without angina pectoris: Secondary | ICD-10-CM | POA: Diagnosis not present

## 2022-05-02 DIAGNOSIS — E785 Hyperlipidemia, unspecified: Secondary | ICD-10-CM | POA: Diagnosis present

## 2022-05-02 DIAGNOSIS — I10 Essential (primary) hypertension: Secondary | ICD-10-CM | POA: Diagnosis present

## 2022-05-02 DIAGNOSIS — I255 Ischemic cardiomyopathy: Secondary | ICD-10-CM | POA: Diagnosis not present

## 2022-05-02 DIAGNOSIS — I2102 ST elevation (STEMI) myocardial infarction involving left anterior descending coronary artery: Secondary | ICD-10-CM | POA: Diagnosis not present

## 2022-05-02 DIAGNOSIS — E876 Hypokalemia: Secondary | ICD-10-CM | POA: Diagnosis present

## 2022-05-02 DIAGNOSIS — I5020 Unspecified systolic (congestive) heart failure: Secondary | ICD-10-CM | POA: Diagnosis present

## 2022-05-02 LAB — COMPREHENSIVE METABOLIC PANEL
ALT: 77 U/L — ABNORMAL HIGH (ref 0–44)
AST: 100 U/L — ABNORMAL HIGH (ref 15–41)
Albumin: 3.9 g/dL (ref 3.5–5.0)
Alkaline Phosphatase: 191 U/L — ABNORMAL HIGH (ref 38–126)
Anion gap: 12 (ref 5–15)
BUN: 22 mg/dL (ref 8–23)
CO2: 24 mmol/L (ref 22–32)
Calcium: 9.4 mg/dL (ref 8.9–10.3)
Chloride: 103 mmol/L (ref 98–111)
Creatinine, Ser: 1.27 mg/dL — ABNORMAL HIGH (ref 0.44–1.00)
GFR, Estimated: 44 mL/min — ABNORMAL LOW (ref >60)
Glucose, Bld: 101 mg/dL — ABNORMAL HIGH (ref 70–99)
Potassium: 3.2 mmol/L — ABNORMAL LOW (ref 3.5–5.1)
Sodium: 139 mmol/L (ref 135–145)
Total Bilirubin: 1 mg/dL (ref 0.3–1.2)
Total Protein: 8 g/dL (ref 6.5–8.1)

## 2022-05-02 MED ORDER — ATORVASTATIN CALCIUM 80 MG PO TABS
80.0000 mg | ORAL_TABLET | Freq: Every evening | ORAL | 1 refills | Status: DC
  Filled 2022-05-02: qty 30, 30d supply, fill #0

## 2022-05-02 MED ORDER — CARVEDILOL 3.125 MG PO TABS
3.1250 mg | ORAL_TABLET | Freq: Two times a day (BID) | ORAL | 1 refills | Status: DC
  Filled 2022-05-02 – 2022-05-03 (×2): qty 60, 30d supply, fill #0
  Filled 2022-05-28: qty 60, 30d supply, fill #1

## 2022-05-02 MED ORDER — ENTRESTO 24-26 MG PO TABS
1.0000 | ORAL_TABLET | Freq: Two times a day (BID) | ORAL | 1 refills | Status: DC
  Filled 2022-05-02 – 2022-05-03 (×2): qty 60, 30d supply, fill #0
  Filled 2022-05-28: qty 60, 30d supply, fill #1

## 2022-05-02 MED ORDER — NITROGLYCERIN 0.4 MG SL SUBL
SUBLINGUAL_TABLET | SUBLINGUAL | 1 refills | Status: AC
  Filled 2022-05-02: qty 25, 8d supply, fill #0
  Filled 2022-05-28: qty 25, fill #0
  Filled 2022-06-23: qty 25, 30d supply, fill #0

## 2022-05-02 MED ORDER — TICAGRELOR 90 MG PO TABS
90.0000 mg | ORAL_TABLET | Freq: Two times a day (BID) | ORAL | 1 refills | Status: DC
  Filled 2022-05-02: qty 60, 30d supply, fill #0

## 2022-05-02 MED ORDER — FUROSEMIDE 20 MG PO TABS
20.0000 mg | ORAL_TABLET | Freq: Every day | ORAL | 1 refills | Status: DC
  Filled 2022-05-02: qty 30, 30d supply, fill #0

## 2022-05-02 NOTE — Patient Instructions (Signed)
Medication Instructions:  STOP losartan START Entresto 24-26 mg. Take 1 tablet by mouth twice daily  *If you need a refill on your cardiac medications before your next appointment, please call your pharmacy*  Lab Work: CMP - Please go to the [facility]. You will check in at the front desk to the right as you walk into the atrium. Valet Parking is offered if needed. - No appointment needed. You may go any day between 7 am and 6 pm.  If you have labs (blood work) drawn today and your tests are completely normal, you will receive your results only by: [place] (if you have MyChart) OR A paper copy in the mail If you have any lab test that is abnormal or we need to change your treatment, we will call you to review the results.   Testing/Procedures: No testing ordered  Follow-Up: At [facility], you and your health needs are our priority.  As part of our continuing mission to provide you with exceptional heart care, we have created designated Provider Care Teams.  These Care Teams include your primary Cardiologist (physician) and Advanced Practice Providers (APPs -  Physician Assistants and Nurse Practitioners) who all work together to provide you with the care you need, when you need it.  We recommend signing up for the patient portal called "MyChart".  Sign up information is provided on this After Visit Summary.  MyChart is used to connect with patients for Virtual Visits (Telemedicine).  Patients are able to view lab/test results, encounter notes, upcoming appointments, etc.  Non-urgent messages can be sent to your provider as well.   To learn more about what you can do with MyChart, go to [email or web address].    Your next appointment:   2 week(s)  The format for your next appointment:   In Person  Provider:   Christell Faith, PA-C   Important Information About Sugar

## 2022-05-02 NOTE — Progress Notes (Signed)
Cardiology Office Note    Date:  05/02/2022   ID:  Vendetta, Pittinger 1946/01/08, MRN [id number]  PCP:  Lynnell Jude, MD  Cardiologist:  Kathlyn [facility], MD  Electrophysiologist:  None   Chief Complaint: Hospital follow-up  History of Present Illness:   Lisa Hale is a 77 y.o. female with history of CAD with anterior ST elevation MI in 03/2022 status post PCI/DES to the LAD, HFrEF secondary to ICM, HTN, HLD, hypothyroidism, scoliosis, prior tobacco use quitting in 1999, and GERD who presents for hospital follow-up as outlined below.  She was admitted to the hospital from 12/4 through 04/05/2022 with an anterior ST elevation MI.  Leading up to her admission, she reported substernal chest pain and tightness that radiated to the bilateral arms the day prior.  She was initially evaluated at an urgent care where EKG showed an anterior STEMI with subsequent code STEMI activation and transport to the ED via EMS.  Emergent LHC on 04/03/2022 showed an occluded mid LAD.  In addition, there was severe stenosis in the proximal RCA.  There was moderately reduced LV systolic function with an EF of 35% with akinesis of the mid to distal anterior wall, apex, and distal inferior wall.  Moderately elevated LVEDP of 27 mmHg.  She underwent successful PCI/DES to the mid LAD and successful OCT-guided PCI/DES to the RCA.  Echo during the admission demonstrated an EF of 25 to 30%, akinesis of the mid and distal anterior septum, apical lateral segment, mid inferoseptal segment, apical anterior segment, and apical inferior segment with hypokinesis of the mid inferolateral, mid anterolateral, mid anterior, and mid inferior segments.  There was also grade 1 diastolic dysfunction, mildly reduced RV systolic function with akinesis of the apex, moderately enlarged RV cavity size, mild mitral regurgitation, and aortic valve sclerosis with mild stenosis.  High-sensitivity troponin peaked at greater than 24,000.   LifeVest was placed prior to discharge, however this had to be discontinued secondary to blisters noted at skin contact points.  She comes in accompanied by her daughter and is doing well from a cardiac perspective, without symptoms of angina or decompensation.  No dyspnea, palpitations, dizziness, racing to be, or syncope.  Both patient and daughter feel like the patient is getting better day by day.  They also report some baseline cognitive deficits that preceded her MI which are trending back to baseline as well.  No falls or symptoms concerning for bleeding.  She remains adherent and is tolerating cardiac medications without issues including DAPT.  She is strictly watching her sodium and fluid intake.  No significant lower extremity swelling, progressive orthopnea, PND, abdominal distention, or early satiety.   Labs independently reviewed: 03/2022 - potassium 4.2, BUN 30, serum creatinine 1.37, Hgb 11.4, PLT 276, albumin 3.3, AST 111, ALT normal, LP(a) 91.1, A1c 5.7, magnesium 2.0, TC 283, TG 126, HDL 57, LDL 201  Past Medical History:  Diagnosis Date   Asthma    CHF (congestive heart failure) (HCC)    Coronary artery disease    GERD (gastroesophageal reflux disease)    Glaucoma    Hypertension    Hypothyroidism    Scoliosis    Seasonal allergies    Skin cancer    Vertigo    Avg: 1x/mo    Past Surgical History:  Procedure Laterality Date   CATARACT EXTRACTION W/PHACO Right 03/13/2022   Procedure: CATARACT EXTRACTION PHACO AND INTRAOCULAR LENS PLACEMENT (IOC) RIGHT WITH HYDRUS MICROSTENT  6.72  00:37.7;  Surgeon: Eulogio Bear, MD;  Location: [facility];  Service: Ophthalmology;  Laterality: Right;   CATARACT EXTRACTION W/PHACO Left 03/27/2022   Procedure: CATARACT EXTRACTION PHACO AND INTRAOCULAR LENS PLACEMENT (IOC) LEFT WITH IOL PLUS HYDRUS MICROSTENT 5.21 00:30.9;  Surgeon: Eulogio Bear, MD;  Location: [facility];  Service: Ophthalmology;  Laterality:  Left;   COLONOSCOPY     COLONOSCOPY WITH PROPOFOL N/A 03/12/2018   Procedure: COLONOSCOPY WITH PROPOFOL;  Surgeon: Virgel Manifold, MD;  Location: [facility];  Service: Endoscopy;  Laterality: N/A;   CORONARY/GRAFT ACUTE MI REVASCULARIZATION N/A 04/03/2022   Procedure: Coronary/Graft Acute MI Revascularization;  Surgeon: Wellington Hampshire, MD;  Location: [facility];  Service: Cardiovascular;  Laterality: N/A;   LEFT HEART CATH AND CORONARY ANGIOGRAPHY N/A 04/03/2022   Procedure: LEFT HEART CATH AND CORONARY ANGIOGRAPHY;  Surgeon: Wellington Hampshire, MD;  Location: [facility];  Service: Cardiovascular;  Laterality: N/A;   ORIF TIBIA & FIBULA FRACTURES     TONSILLECTOMY      Current Medications: Current Meds  Medication Sig   albuterol (PROVENTIL HFA;VENTOLIN HFA) 108 (90 Base) MCG/ACT inhaler Inhale into the lungs every 6 (six) hours as needed for wheezing or shortness of breath.   aspirin 81 MG chewable tablet Chew 1 tablet (81 mg total) by mouth daily.   cholecalciferol 25 MCG (1000 UT) TABS Take by mouth daily.   co-enzyme Q-10 30 MG capsule Take 100 mg by mouth daily. Takes with atorvastatin.   dextromethorphan-guaiFENesin (ROBITUSSIN-DM) 10-100 MG/5ML liquid Take 5 mLs by mouth every 4 (four) hours as needed for cough.   fluticasone (FLOVENT HFA) 220 MCG/ACT inhaler Inhale into the lungs 2 (two) times daily as needed.   lansoprazole (PREVACID) 15 MG capsule Take 15 mg by mouth daily. Take around 8- 9am.   levothyroxine (SYNTHROID) 100 MCG tablet Take 100 mcg by mouth daily.   montelukast (SINGULAIR) 10 MG tablet Take 10 mg by mouth at bedtime.   niacinamide 500 MG tablet Take 500 mg by mouth daily.   sacubitril-valsartan (ENTRESTO) 24-26 MG Take 1 tablet by mouth 2 (two) times daily.   [DISCONTINUED] atorvastatin (LIPITOR) 80 MG tablet Take 1 tablet (80 mg total) by mouth every evening.   [DISCONTINUED] carvedilol (COREG) 3.125 MG tablet Take 1 tablet  (3.125 mg total) by mouth 2 (two) times daily with a meal.   [DISCONTINUED] furosemide (LASIX) 20 MG tablet Take 1 tablet (20 mg total) by mouth daily.   [DISCONTINUED] losartan (COZAAR) 25 MG tablet Take 1 tablet (25 mg total) by mouth daily.   [DISCONTINUED] ticagrelor (BRILINTA) 90 MG TABS tablet Take 1 tablet (90 mg total) by mouth 2 (two) times daily.    Allergies:   Sunflower oil, Bactrim [sulfamethoxazole-trimethoprim], Pineapple, and Tetracycline   Social History   Socioeconomic History   Marital status: Widowed    Spouse name: Not on file   Number of children: Not on file   Years of education: Not on file   Highest education level: Not on file  Occupational History   Not on file  Tobacco Use   Smoking status: Former    Types: Cigarettes    Quit date: 05/01/2001    Years since quitting: 21.0   Smokeless tobacco: Never   Tobacco comments:    has had coccasional cig since quitting  Vaping Use   Vaping Use: Never used  Substance and Sexual Activity   Alcohol use: Yes    Comment: couple  x/yr   Drug use: Never   Sexual activity: Not on file  Other Topics Concern   Not on file  Social History Narrative   Not on file   Social Determinants of Health   Financial Resource Strain: Not on file  Food Insecurity: No Food Insecurity (04/03/2022)   Hunger Vital Sign    Worried About Running Out of Food in the Last Year: Never true    Ran Out of Food in the Last Year: Never true  Transportation Needs: No Transportation Needs (04/03/2022)   PRAPARE - Hydrologist (Medical): No    Lack of Transportation (Non-Medical): No  Physical Activity: Not on file  Stress: Not on file  Social Connections: Not on file     Family History:  The patient's family history includes Colon polyps in her brother.  ROS:   12-point review of systems is negative unless otherwise noted in HPI.   EKGs/Labs/Other Studies Reviewed:    Studies reviewed were summarized  above. The additional studies were reviewed today:  LHC 04/03/2022:   2nd Diag lesion is 60% stenosed.   Mid LAD to Dist LAD lesion is 100% stenosed.   Mid LAD lesion is 40% stenosed.   Dist LAD lesion is 60% stenosed.   Prox RCA lesion is 60% stenosed.   Prox RCA to Mid RCA lesion is 95% stenosed.   A drug-eluting stent was successfully placed using a STENT ONYX FRONTIER 2.25X38.   A drug-eluting stent was successfully placed using a STENT ONYX FRONTIER 3.0X38.   Post intervention, there is a 0% residual stenosis.   Post intervention, there is a 0% residual stenosis.   Post intervention, there is a 0% residual stenosis.   There is moderate left ventricular systolic dysfunction.   LV end diastolic pressure is moderately elevated.   1.  Anterior ST elevation myocardial infarction due to an occluded mid LAD.  In addition, there is severe stenosis in the proximal right coronary artery. 2.  Moderately reduced LV systolic function with an EF of 35% with akinesis of the mid to distal anterior wall, apex and distal inferior wall.  Moderately elevated left ventricular end-diastolic pressure with an LVEDP of 27 mmHg. 3.  Successful angioplasty and drug-eluting stent placement to the mid LAD. 4.  Successful OCT guided PCI and drug-eluting stent placement to the right coronary artery.   Recommendations: Dual antiplatelet therapy for at least 12 months. Aggressive treatment of risk factors. I started small dose carvedilol. __________  2D echo 04/04/2022: 1. Left ventricular ejection fraction, by estimation, is 25 to 30%. The  left ventricle has severely decreased function. The left ventricle  demonstrates regional wall motion abnormalities (see scoring  diagram/findings for description). Left ventricular  diastolic parameters are consistent with Grade I diastolic dysfunction  (impaired relaxation).   2. Right ventricular systolic function is mildly reduced with akinesis of  the apex. The right  ventricular size is moderately enlarged.   3. The mitral valve is normal in structure. Mild mitral valve  regurgitation.   4. The aortic valve has an indeterminant number of cusps. There is mild  calcification of the aortic valve. There is moderate thickening of the  aortic valve. Aortic valve regurgitation is not visualized. Mild aortic  valve stenosis. Aortic valve area, by  VTI measures 1.29 cm. Aortic valve mean gradient measures 5.0 mmHg.    EKG:  EKG is ordered today.  The EKG ordered today demonstrates NSR, 65 bpm, incomplete RBBB,  prior inferior infarct, nonspecific anterolateral ST-T changes consistent with prior anterior MI  Recent Labs: 04/03/2022: Magnesium 2.0 04/05/2022: ALT 40 04/06/2022: Hemoglobin 11.4; Platelets 276 04/13/2022: BUN 30; Creatinine, Ser 1.37; Potassium 4.2; Sodium 138  Recent Lipid Panel    Component Value Date/Time   CHOL 283 (H) 04/03/2022 1003   TRIG 126 04/03/2022 1003   HDL 57 04/03/2022 1003   CHOLHDL 5.0 04/03/2022 1003   VLDL 25 04/03/2022 1003   LDLCALC 201 (H) 04/03/2022 1003    PHYSICAL EXAM:    VS:  BP 118/64 (BP Location: Left Arm, Patient Position: Sitting, Cuff Size: Large)   Pulse 65   Ht '5\' 3"'$  (1.6 m)   Wt 152 lb (68.9 kg)   SpO2 98%   BMI 26.93 kg/m   BMI: Body mass index is 26.93 kg/m.  Physical Exam Vitals reviewed.  Constitutional:      Appearance: She is well-developed.  HENT:     Head: Normocephalic and atraumatic.  Eyes:     General:        Right eye: No discharge.        Left eye: No discharge.  Neck:     Vascular: No JVD.  Cardiovascular:     Rate and Rhythm: Normal rate and regular rhythm.     Heart sounds: S1 normal and S2 normal. Heart sounds not distant. No midsystolic click and no opening snap. Murmur heard.     Harsh midsystolic murmur is present with a grade of 1/6 at the upper right sternal border radiating to the neck.     No friction rub.     Comments: Right radial arteriotomy site is  well-healed without active bleeding, bruising, swelling, warmth, erythema, or tenderness to palpation.  Radial pulse 2+ proximal and distal to the arteriotomy site. Pulmonary:     Effort: Pulmonary effort is normal. No respiratory distress.     Breath sounds: Normal breath sounds. No decreased breath sounds, wheezing or rales.  Chest:     Chest wall: No tenderness.  Abdominal:     General: There is no distension.     Palpations: Abdomen is soft.     Tenderness: There is no abdominal tenderness.  Musculoskeletal:     Cervical back: Normal range of motion.  Skin:    General: Skin is warm and dry.     Nails: There is no clubbing.  Neurological:     Mental Status: She is alert and oriented to person, place, and time.  Psychiatric:        Speech: Speech normal.        Behavior: Behavior normal.        Thought Content: Thought content normal.        Judgment: Judgment normal.     Wt Readings from Last 3 Encounters:  05/02/22 152 lb (68.9 kg)  04/13/22 156 lb 2 oz (70.8 kg)  04/03/22 163 lb 5.8 oz (74.1 kg)     ASSESSMENT & PLAN:   CAD with recent anterior ST elevation MI: She is doing well and is without symptoms concerning for angina or decompensation.  Continue DAPT with aspirin 81 mg daily and Brilinta 90 mg twice daily without interruption for a minimum of 12 months from date of PCI (04/03/2022).  Aggressive risk factor modification and secondary prevention including continuation of carvedilol and atorvastatin.  She is interested in participating with cardiac rehab.  No indication for further ischemic testing at this time.  HFrEF secondary to ICM: She appears euvolemic  and well compensated.  No longer wearing LifeVest secondary to blisters noted at skin contact points.  Repeat limited echo in 3 months from date of PCI and following optimization of GDMT to reevaluate LV systolic function.  If her EF remains less than 35% at that time she will need be evaluated by EP for consideration  of ICD.  May also benefit from viability study, pending results of echo.  Transition losartan to Entresto 24/26 mg twice daily with continuation of carvedilol 3.125 mg twice daily and furosemide.  In follow-up, look to further escalate GDMT as tolerated/able with the addition of MRA and SGLT2 inhibitor.  Check CMP.  HTN: Blood pressure is well-controlled in the office today.  Continue medical therapy as outlined above.  HLD: LDL 201 with goal being less than 55.  Discharged on atorvastatin 80 mg.  Follow-up fasting lipid panel and LFT in 1 month with recommendation to escalate lipid-lowering therapy as indicated to achieve target LDL.  Elevated LFTs: Presumed to be in the setting of her MI.  Hepatitis panel unrevealing during admission.  Check CMP   Disposition: F/u with Dr. Fletcher Anon or an APP in 2 weeks.   Medication Adjustments/Labs and Tests Ordered: Current medicines are reviewed at length with the patient today.  Concerns regarding medicines are outlined above. Medication changes, Labs and Tests ordered today are summarized above and listed in the Patient Instructions accessible in Encounters.   Signed, Christell Faith, PA-C 05/02/2022 4:43 PM     [facility] [address] [place] [address] [place], [place] [address] [phone]

## 2022-05-03 ENCOUNTER — Other Ambulatory Visit: Payer: Self-pay | Admitting: Student in an Organized Health Care Education/Training Program

## 2022-05-03 ENCOUNTER — Other Ambulatory Visit: Payer: Self-pay

## 2022-05-04 ENCOUNTER — Telehealth: Payer: Self-pay | Admitting: *Deleted

## 2022-05-04 NOTE — Telephone Encounter (Signed)
-----   Message from Rise Mu, PA-C sent at 05/03/2022  7:32 AM EST ----- Potassium low Random glucose okay Renal function mildly elevated, though consistent with prior readings Liver function remains abnormal with some values improving  Recommendations: -With noted mild renal dysfunction, please take furosemide 20 mg as needed for increased shortness of breath or weight gain greater than 3 pounds overnight -Potassium remains low, she has had difficulty swallowing KCl tabs, stop KCl tabs -Send in KCl packet for ease of medication administration, 20 mEq daily -Hold Lipitor for now to trend liver function, will plan for follow up labs at her next visit

## 2022-05-04 NOTE — Telephone Encounter (Signed)
Spoke with patient and she requested that I please give her a call back tomorrow after 10 am

## 2022-05-05 MED ORDER — FUROSEMIDE 20 MG PO TABS: 20.0000 mg | ORAL_TABLET | ORAL | 1 refills | Status: DC | PRN

## 2022-05-05 MED ORDER — POTASSIUM CHLORIDE CRYS ER 20 MEQ PO TBCR: 20.0000 meq | EXTENDED_RELEASE_TABLET | Freq: Every day | ORAL | 3 refills | Status: DC

## 2022-05-05 NOTE — Telephone Encounter (Signed)
Spoke with patient and we went through and reviewed results in great detail. She spelled everything out and read back all instructions. She verbalized understanding with no further questions and confirmed her upcoming appointment.

## 2022-05-15 NOTE — Progress Notes (Unsigned)
Cardiology Office Note    Date:  05/16/2022   ID:  Arlen, Legendre [birth year], MRN [id number]  PCP:  Lynnell Jude, MD  Cardiologist:  Kathlyn Sacramento, MD  Electrophysiologist:  None   Chief Complaint: Follow-up  History of Present Illness:   Lisa Hale is a 77 y.o. female with history of CAD with anterior ST elevation MI in 03/2022 status post PCI/DES to the LAD, HFrEF secondary to ICM, HTN, HLD, hypothyroidism, scoliosis, prior tobacco use quitting in 1999, and GERD who presents for follow-up of CAD and cardiomyopathy.   She was admitted to the hospital from 12/4 through 04/05/2022 with an anterior ST elevation MI.  Leading up to her admission, she reported substernal chest pain and tightness that radiated to the bilateral arms the day prior.  She was initially evaluated at an urgent care where EKG showed an anterior STEMI with subsequent code STEMI activation and transport to the ED via EMS.  Emergent LHC on 04/03/2022 showed an occluded mid LAD.  In addition, there was severe stenosis in the proximal RCA.  There was moderately reduced LV systolic function with an EF of 35% with akinesis of the mid to distal anterior wall, apex, and distal inferior wall.  Moderately elevated LVEDP of 27 mmHg.  She underwent successful PCI/DES to the mid LAD and successful OCT-guided PCI/DES to the RCA.  Echo during the admission demonstrated an EF of 25 to 30%, akinesis of the mid and distal anterior septum, apical lateral segment, mid inferoseptal segment, apical anterior segment, and apical inferior segment with hypokinesis of the mid inferolateral, mid anterolateral, mid anterior, and mid inferior segments.  There was also grade 1 diastolic dysfunction, mildly reduced RV systolic function with akinesis of the apex, moderately enlarged RV cavity size, mild mitral regurgitation, and aortic valve sclerosis with mild stenosis.  High-sensitivity troponin peaked at greater than 24,000.   LifeVest was placed prior to discharge, however this had to be discontinued secondary to blisters noted at skin contact points.  She was seen in hospital follow-up on 05/02/2022 and was without symptoms of angina or decompensation.  She felt like she was getting better day by day.  Baseline cognitive deficits were trending back to baseline.  She was adherent and tolerating cardiac medications.  She was transitioned from losartan to [facility] with continuation of carvedilol and furosemide.  Follow-up labs showed persistent transaminitis with newly elevated ALT leading to the suspension of atorvastatin.  She comes in accompanied by her daughter today and is doing well from a cardiac perspective, without symptoms of angina or decompensation.  No dyspnea, palpitations, dizziness, presyncope, or syncope.  No falls, hematochezia, or melena.  Patient is adherent to cardiac medications and has not missed any doses of DAPT.  No lower extremity swelling or progressive orthopnea.  No early satiety.  Her appetite has picked up since her last visit.  Her weight is down 2 pounds by our scale.  She has remained on furosemide 20 mg daily with good urine output.  She continues to refrain from driving.  Overall, she feels like she is doing well.   Labs independently reviewed: 05/2022 - potassium 3.2, BUN 22, serum creatinine 1.27, albumin 3.9, AST 100, ALT 77 03/2022 - Hgb 11.4, PLT 276, LP(a) 91.1, A1c 5.7, magnesium 2.0, TC 283, TG 126, HDL 57, LDL 201   Past Medical History:  Diagnosis Date   Asthma    CHF (congestive heart failure) (HCC)    Coronary artery disease  GERD (gastroesophageal reflux disease)    Glaucoma    Hypertension    Hypothyroidism    Scoliosis    Seasonal allergies    Skin cancer    Vertigo    Avg: 1x/mo    Past Surgical History:  Procedure Laterality Date   CATARACT EXTRACTION W/PHACO Right 03/13/2022   Procedure: CATARACT EXTRACTION PHACO AND INTRAOCULAR LENS PLACEMENT (IOC) RIGHT  WITH HYDRUS MICROSTENT  6.72  00:37.7;  Surgeon: Eulogio Bear, MD;  Location: [facility];  Service: Ophthalmology;  Laterality: Right;   CATARACT EXTRACTION W/PHACO Left 03/27/2022   Procedure: CATARACT EXTRACTION PHACO AND INTRAOCULAR LENS PLACEMENT (IOC) LEFT WITH IOL PLUS HYDRUS MICROSTENT 5.21 00:30.9;  Surgeon: Eulogio Bear, MD;  Location: [facility];  Service: Ophthalmology;  Laterality: Left;   COLONOSCOPY     COLONOSCOPY WITH PROPOFOL N/A 03/12/2018   Procedure: COLONOSCOPY WITH PROPOFOL;  Surgeon: Virgel Manifold, MD;  Location: [facility];  Service: Endoscopy;  Laterality: N/A;   CORONARY/GRAFT ACUTE MI REVASCULARIZATION N/A 04/03/2022   Procedure: Coronary/Graft Acute MI Revascularization;  Surgeon: Wellington Hampshire, MD;  Location: [facility];  Service: Cardiovascular;  Laterality: N/A;   LEFT HEART CATH AND CORONARY ANGIOGRAPHY N/A 04/03/2022   Procedure: LEFT HEART CATH AND CORONARY ANGIOGRAPHY;  Surgeon: Wellington Hampshire, MD;  Location: [facility];  Service: Cardiovascular;  Laterality: N/A;   ORIF TIBIA & FIBULA FRACTURES     TONSILLECTOMY      Current Medications: Current Meds  Medication Sig   albuterol (PROVENTIL HFA;VENTOLIN HFA) 108 (90 Base) MCG/ACT inhaler Inhale into the lungs every 6 (six) hours as needed for wheezing or shortness of breath.   carvedilol (COREG) 3.125 MG tablet Take 1 tablet (3.125 mg total) by mouth 2 (two) times daily with a meal.   cholecalciferol 25 MCG (1000 UT) TABS Take by mouth daily.   co-enzyme Q-10 30 MG capsule Take 100 mg by mouth daily. Takes with atorvastatin.   dextromethorphan-guaiFENesin (ROBITUSSIN-DM) 10-100 MG/5ML liquid Take 5 mLs by mouth every 4 (four) hours as needed for cough.   fluticasone (FLOVENT HFA) 220 MCG/ACT inhaler Inhale into the lungs 2 (two) times daily as needed.   furosemide (LASIX) 20 MG tablet Take 1 tablet (20 mg total) by mouth as needed (as  needed for increased shortness of breath or weight gain greater than 3 pounds overnight).   lansoprazole (PREVACID) 15 MG capsule Take 15 mg by mouth daily. Take around 8- 9am.   levothyroxine (SYNTHROID) 100 MCG tablet Take 100 mcg by mouth daily.   montelukast (SINGULAIR) 10 MG tablet Take 10 mg by mouth at bedtime.   niacinamide 500 MG tablet Take 500 mg by mouth daily.   nitroGLYCERIN (NITROSTAT) 0.4 MG SL tablet Take 1 tablet under the tongue every 5 minutes for up to 3 doses as directed   potassium chloride SA (KLOR-CON M) 20 MEQ tablet Take 1 tablet (20 mEq total) by mouth daily.   sacubitril-valsartan (ENTRESTO) 24-26 MG Take 1 tablet by mouth 2 (two) times daily.   ticagrelor (BRILINTA) 90 MG TABS tablet Take 1 tablet (90 mg total) by mouth 2 (two) times daily.   Turmeric Curcumin 500 MG CAPS Take by mouth daily.    Allergies:   Sunflower oil, Bactrim [sulfamethoxazole-trimethoprim], Pineapple, and Tetracycline   Social History   Socioeconomic History   Marital status: Widowed    Spouse name: Not on file   Number of children: Not on file  Years of education: Not on file   Highest education level: Not on file  Occupational History   Not on file  Tobacco Use   Smoking status: Former    Types: Cigarettes    Quit date: 05/01/2001    Years since quitting: 21.0   Smokeless tobacco: Never   Tobacco comments:    has had coccasional cig since quitting  Vaping Use   Vaping Use: Never used  Substance and Sexual Activity   Alcohol use: Yes    Comment: couple x/yr   Drug use: Never   Sexual activity: Not on file  Other Topics Concern   Not on file  Social History Narrative   Not on file   Social Determinants of Health   Financial Resource Strain: Not on file  Food Insecurity: No Food Insecurity (04/03/2022)   Hunger Vital Sign    Worried About Running Out of Food in the Last Year: Never true    Ran Out of Food in the Last Year: Never true  Transportation Needs: No  Transportation Needs (04/03/2022)   PRAPARE - Hydrologist (Medical): No    Lack of Transportation (Non-Medical): No  Physical Activity: Not on file  Stress: Not on file  Social Connections: Not on file     Family History:  The patient's family history includes Colon polyps in her brother.  ROS:   12-point review of systems is negative unless otherwise noted in HPI   EKGs/Labs/Other Studies Reviewed:    Studies reviewed were summarized above. The additional studies were reviewed today:  LHC 04/03/2022:   2nd Diag lesion is 60% stenosed.   Mid LAD to Dist LAD lesion is 100% stenosed.   Mid LAD lesion is 40% stenosed.   Dist LAD lesion is 60% stenosed.   Prox RCA lesion is 60% stenosed.   Prox RCA to Mid RCA lesion is 95% stenosed.   A drug-eluting stent was successfully placed using a STENT ONYX FRONTIER 2.25X38.   A drug-eluting stent was successfully placed using a STENT ONYX FRONTIER 3.0X38.   Post intervention, there is a 0% residual stenosis.   Post intervention, there is a 0% residual stenosis.   Post intervention, there is a 0% residual stenosis.   There is moderate left ventricular systolic dysfunction.   LV end diastolic pressure is moderately elevated.   1.  Anterior ST elevation myocardial infarction due to an occluded mid LAD.  In addition, there is severe stenosis in the proximal right coronary artery. 2.  Moderately reduced LV systolic function with an EF of 35% with akinesis of the mid to distal anterior wall, apex and distal inferior wall.  Moderately elevated left ventricular end-diastolic pressure with an LVEDP of 27 mmHg. 3.  Successful angioplasty and drug-eluting stent placement to the mid LAD. 4.  Successful OCT guided PCI and drug-eluting stent placement to the right coronary artery.   Recommendations: Dual antiplatelet therapy for at least 12 months. Aggressive treatment of risk factors. I started small dose  carvedilol. __________   2D echo 04/04/2022: 1. Left ventricular ejection fraction, by estimation, is 25 to 30%. The  left ventricle has severely decreased function. The left ventricle  demonstrates regional wall motion abnormalities (see scoring  diagram/findings for description). Left ventricular  diastolic parameters are consistent with Grade I diastolic dysfunction  (impaired relaxation).   2. Right ventricular systolic function is mildly reduced with akinesis of  the apex. The right ventricular size is moderately enlarged.  3. The mitral valve is normal in structure. Mild mitral valve  regurgitation.   4. The aortic valve has an indeterminant number of cusps. There is mild  calcification of the aortic valve. There is moderate thickening of the  aortic valve. Aortic valve regurgitation is not visualized. Mild aortic  valve stenosis. Aortic valve area, by  VTI measures 1.29 cm. Aortic valve mean gradient measures 5.0 mmHg.    EKG:  EKG is not ordered today.    Recent Labs: 04/03/2022: Magnesium 2.0 04/06/2022: Hemoglobin 11.4; Platelets 276 05/02/2022: ALT 77; BUN 22; Creatinine, Ser 1.27; Potassium 3.2; Sodium 139  Recent Lipid Panel    Component Value Date/Time   CHOL 283 (H) 04/03/2022 1003   TRIG 126 04/03/2022 1003   HDL 57 04/03/2022 1003   CHOLHDL 5.0 04/03/2022 1003   VLDL 25 04/03/2022 1003   LDLCALC 201 (H) 04/03/2022 1003    PHYSICAL EXAM:    VS:  BP 114/78 (BP Location: Left Arm, Patient Position: Sitting, Cuff Size: Normal)   Pulse 60   Ht '5\' 3"'$  (1.6 m)   Wt 150 lb (68 kg)   SpO2 98%   BMI 26.57 kg/m   BMI: Body mass index is 26.57 kg/m.  Physical Exam Vitals reviewed.  Constitutional:      Appearance: She is well-developed.  HENT:     Head: Normocephalic and atraumatic.  Eyes:     General:        Right eye: No discharge.        Left eye: No discharge.  Neck:     Vascular: No JVD.  Cardiovascular:     Rate and Rhythm: Normal rate and regular  rhythm.     Pulses:          Posterior tibial pulses are 2+ on the right side and 2+ on the left side.     Heart sounds: S1 normal and S2 normal. Heart sounds not distant. No midsystolic click and no opening snap. Murmur heard.     Systolic murmur is present with a grade of 1/6 at the upper right sternal border.     No friction rub.  Pulmonary:     Effort: Pulmonary effort is normal. No respiratory distress.     Breath sounds: Normal breath sounds. No decreased breath sounds, wheezing or rales.  Chest:     Chest wall: No tenderness.  Abdominal:     General: There is no distension.  Musculoskeletal:     Cervical back: Normal range of motion.     Right lower leg: No edema.     Left lower leg: No edema.  Skin:    General: Skin is warm and dry.     Nails: There is no clubbing.  Neurological:     Mental Status: She is alert and oriented to person, place, and time.  Psychiatric:        Speech: Speech normal.        Behavior: Behavior normal.        Thought Content: Thought content normal.        Judgment: Judgment normal.     Wt Readings from Last 3 Encounters:  05/16/22 150 lb (68 kg)  05/02/22 152 lb (68.9 kg)  04/13/22 156 lb 2 oz (70.8 kg)     ASSESSMENT & PLAN:   CAD with recent anterior ST elevation MI: She continues to do very well and is without symptoms concerning for angina or decompensation.  Continue DAPT with aspirin 81 mg  daily and ticagrelor 90 mg twice daily without interruption for a minimum of 12 months from date of PCI (04/03/2022).  Continue aggressive risk factor modification and secondary prevention including carvedilol.  She will be participating in cardiac rehab.  We will reach out to them today.  No indication for further ischemic testing at this time.  HFrEF secondary to ICM: She appears euvolemic and well compensated.  She did not tolerate LifeVest secondary to blisters noted at skin contact points.  Refrain from driving until we have had a chance to  evaluate for improvement in her LV systolic function in 09/5679.  She remains on carvedilol and Entresto.  Blood pressure, heart rate, and renal function have precluded further escalation of GDMT.  Check CMP today.  If renal function and potassium allow, would look to add low-dose spironolactone 12.5 mg daily with a follow-up BMP 1 week thereafter.  She has been taking furosemide 20 mg daily rather than as needed, may need to de-escalate loop diuretic pending labs.  We will pursue a limited echo in 07/2022 to reevaluate LV systolic function following PCI and optimization of GDMT.  If her EF remains less than 35% at that time, would recommend she be evaluated by EP for consideration of ICD.  However, she indicates that she may not want to pursue ICD.  If her cardiomyopathy persists on follow-up echo, she may also benefit from viability study.  HTN: Blood pressure is well-controlled in the office today.  Continue medical therapy as outlined above.  HLD: LDL 201 with goal being less than 55.  Atorvastatin has been held following her last visit given elevations in LFT.  If transaminitis persists, we may need to pursue PCSK9 inhibitor.  Elevated LFTs: Initially presumed to be in the setting of her MI.  Now off atorvastatin.  Check LFT.   Disposition: F/u with Dr. Fletcher Anon or an APP in 1 month.   Medication Adjustments/Labs and Tests Ordered: Current medicines are reviewed at length with the patient today.  Concerns regarding medicines are outlined above. Medication changes, Labs and Tests ordered today are summarized above and listed in the Patient Instructions accessible in Encounters.   Signed, Christell Faith, PA-C 05/16/2022 3:47 PM     [facility] [address] [place] [address] [place], [place] [address] [phone]

## 2022-05-16 ENCOUNTER — Telehealth: Payer: Self-pay | Admitting: *Deleted

## 2022-05-16 ENCOUNTER — Other Ambulatory Visit
Admission: RE | Admit: 2022-05-16 | Discharge: 2022-05-16 | Disposition: A | Discharge status: Home / Self Care | Payer: Medicare Other | Attending: Physician Assistant | Admitting: Physician Assistant

## 2022-05-16 ENCOUNTER — Ambulatory Visit: Payer: Medicare Other | Attending: Physician Assistant | Admitting: Physician Assistant

## 2022-05-16 ENCOUNTER — Encounter: Payer: Self-pay | Admitting: Physician Assistant

## 2022-05-16 VITALS — BP 114/78 | HR 60 | Ht 63.0 in | Wt 150.0 lb

## 2022-05-16 DIAGNOSIS — I2510 Atherosclerotic heart disease of native coronary artery without angina pectoris: Secondary | ICD-10-CM | POA: Insufficient documentation

## 2022-05-16 DIAGNOSIS — I10 Essential (primary) hypertension: Secondary | ICD-10-CM | POA: Insufficient documentation

## 2022-05-16 DIAGNOSIS — R7989 Other specified abnormal findings of blood chemistry: Secondary | ICD-10-CM | POA: Insufficient documentation

## 2022-05-16 DIAGNOSIS — I2102 ST elevation (STEMI) myocardial infarction involving left anterior descending coronary artery: Secondary | ICD-10-CM | POA: Insufficient documentation

## 2022-05-16 DIAGNOSIS — I5020 Unspecified systolic (congestive) heart failure: Secondary | ICD-10-CM | POA: Insufficient documentation

## 2022-05-16 DIAGNOSIS — E785 Hyperlipidemia, unspecified: Secondary | ICD-10-CM

## 2022-05-16 DIAGNOSIS — I255 Ischemic cardiomyopathy: Secondary | ICD-10-CM

## 2022-05-16 LAB — COMPREHENSIVE METABOLIC PANEL
ALT: 51 U/L — ABNORMAL HIGH (ref 0–44)
AST: 52 U/L — ABNORMAL HIGH (ref 15–41)
Albumin: 3.9 g/dL (ref 3.5–5.0)
Alkaline Phosphatase: 189 U/L — ABNORMAL HIGH (ref 38–126)
Anion gap: 9 (ref 5–15)
BUN: 24 mg/dL — ABNORMAL HIGH (ref 8–23)
CO2: 24 mmol/L (ref 22–32)
Calcium: 9.2 mg/dL (ref 8.9–10.3)
Chloride: 103 mmol/L (ref 98–111)
Creatinine, Ser: 1.13 mg/dL — ABNORMAL HIGH (ref 0.44–1.00)
GFR, Estimated: 50 mL/min — ABNORMAL LOW (ref >60)
Glucose, Bld: 104 mg/dL — ABNORMAL HIGH (ref 70–99)
Potassium: 3.4 mmol/L — ABNORMAL LOW (ref 3.5–5.1)
Sodium: 136 mmol/L (ref 135–145)
Total Bilirubin: 1 mg/dL (ref 0.3–1.2)
Total Protein: 8 g/dL (ref 6.5–8.1)

## 2022-05-16 NOTE — Telephone Encounter (Signed)
-----  Message from Rise Mu, PA-C sent at 05/16/2022  4:46 PM EST ----- Renal function is improving.  Potassium improved though does remain slightly below goal.  Liver function is improving.  Recommendations: -Stop KCl -Take furosemide 20 mg only as needed for increased shortness of breath, increased lower extremity swelling, or weight gain greater than 3 pounds overnight -Start spironolactone 12.5 mg daily -Follow-up BMET 1 week after initiating spironolactone -With improving liver function, restart atorvastatin 80 mg -We will follow-up LFTs at her next visit in 1 month to ensure continued improvement in liver function back on statin

## 2022-05-16 NOTE — Patient Instructions (Signed)
Medication Instructions:  No changes at this time.   *If you need a refill on your cardiac medications before your next appointment, please call your pharmacy*   Lab Work: CMET today over at the [facility]. Stop at registration to check in.   If you have labs (blood work) drawn today and your tests are completely normal, you will receive your results only by: [place] (if you have MyChart) OR A paper copy in the mail If you have any lab test that is abnormal or we need to change your treatment, we will call you to review the results.   Testing/Procedures: None   Follow-Up: At [facility], you and your health needs are our priority.  As part of our continuing mission to provide you with exceptional heart care, we have created designated Provider Care Teams.  These Care Teams include your primary Cardiologist (physician) and Advanced Practice Providers (APPs -  Physician Assistants and Nurse Practitioners) who all work together to provide you with the care you need, when you need it.  We recommend signing up for the patient portal called "MyChart".  Sign up information is provided on this After Visit Summary.  MyChart is used to connect with patients for Virtual Visits (Telemedicine).  Patients are able to view lab/test results, encounter notes, upcoming appointments, etc.  Non-urgent messages can be sent to your provider as well.   To learn more about what you can do with MyChart, go to [email or web address].    Your next appointment:   1 month(s)  Provider:   Kathlyn Sacramento, MD or Christell Faith, PA-C    Other Instructions I will call over to Cardiac Rehab to follow up on appointment for you.

## 2022-05-16 NOTE — Telephone Encounter (Signed)
Left voicemail message to call back for review of results and recommendations.  

## 2022-05-17 NOTE — Telephone Encounter (Signed)
Pt is returning call and is requesting return call.

## 2022-05-18 MED ORDER — FUROSEMIDE 20 MG PO TABS: 20.0000 mg | ORAL_TABLET | ORAL | 3 refills | Status: AC | PRN

## 2022-05-18 MED ORDER — ATORVASTATIN CALCIUM 80 MG PO TABS: 80.0000 mg | ORAL_TABLET | Freq: Every day | ORAL | 3 refills | Status: DC

## 2022-05-18 MED ORDER — SPIRONOLACTONE 25 MG PO TABS: 12.5000 mg | ORAL_TABLET | Freq: Every day | ORAL | 3 refills | Status: DC

## 2022-05-18 NOTE — Telephone Encounter (Signed)
Spoke with patient and reviewed results and recommendations with her in detail. She read each thing back as she wrote down our conversation and spelling of medications. She also confirmed labs that are needed and where to go. Once we had reviewed she repeated back all that we discussed and had no further questions.

## 2022-05-22 ENCOUNTER — Other Ambulatory Visit: Payer: Self-pay

## 2022-05-22 ENCOUNTER — Other Ambulatory Visit: Payer: Self-pay | Admitting: *Deleted

## 2022-05-22 ENCOUNTER — Encounter: Payer: Medicare Other | Attending: Cardiovascular Disease

## 2022-05-22 DIAGNOSIS — I213 ST elevation (STEMI) myocardial infarction of unspecified site: Secondary | ICD-10-CM

## 2022-05-22 DIAGNOSIS — I5020 Unspecified systolic (congestive) heart failure: Secondary | ICD-10-CM | POA: Insufficient documentation

## 2022-05-22 DIAGNOSIS — I255 Ischemic cardiomyopathy: Secondary | ICD-10-CM | POA: Insufficient documentation

## 2022-05-22 DIAGNOSIS — R7989 Other specified abnormal findings of blood chemistry: Secondary | ICD-10-CM | POA: Insufficient documentation

## 2022-05-22 DIAGNOSIS — Z955 Presence of coronary angioplasty implant and graft: Secondary | ICD-10-CM

## 2022-05-22 DIAGNOSIS — I2510 Atherosclerotic heart disease of native coronary artery without angina pectoris: Secondary | ICD-10-CM | POA: Insufficient documentation

## 2022-05-22 DIAGNOSIS — Z48812 Encounter for surgical aftercare following surgery on the circulatory system: Secondary | ICD-10-CM | POA: Insufficient documentation

## 2022-05-22 DIAGNOSIS — E785 Hyperlipidemia, unspecified: Secondary | ICD-10-CM | POA: Insufficient documentation

## 2022-05-22 DIAGNOSIS — I252 Old myocardial infarction: Secondary | ICD-10-CM | POA: Insufficient documentation

## 2022-05-22 MED ORDER — TICAGRELOR 90 MG PO TABS: 90.0000 mg | ORAL_TABLET | Freq: Two times a day (BID) | ORAL | 3 refills | Status: DC

## 2022-05-22 NOTE — Progress Notes (Signed)
Virtual Visit completed. Patient informed on EP and RD appointment and 6 Minute walk test. Patient also informed of patient health questionnaires on My Chart. Patient Verbalizes understanding. Visit diagnosis can be found in [facility] 04/03/2022.

## 2022-05-24 ENCOUNTER — Encounter: Payer: Medicare Other | Admitting: Family

## 2022-05-24 VITALS — Ht 63.0 in | Wt 154.2 lb

## 2022-05-24 DIAGNOSIS — E785 Hyperlipidemia, unspecified: Secondary | ICD-10-CM | POA: Diagnosis not present

## 2022-05-24 DIAGNOSIS — I252 Old myocardial infarction: Secondary | ICD-10-CM | POA: Diagnosis present

## 2022-05-24 DIAGNOSIS — I2510 Atherosclerotic heart disease of native coronary artery without angina pectoris: Secondary | ICD-10-CM | POA: Diagnosis not present

## 2022-05-24 DIAGNOSIS — I213 ST elevation (STEMI) myocardial infarction of unspecified site: Secondary | ICD-10-CM

## 2022-05-24 DIAGNOSIS — I255 Ischemic cardiomyopathy: Secondary | ICD-10-CM | POA: Diagnosis not present

## 2022-05-24 DIAGNOSIS — Z955 Presence of coronary angioplasty implant and graft: Secondary | ICD-10-CM

## 2022-05-24 DIAGNOSIS — R7989 Other specified abnormal findings of blood chemistry: Secondary | ICD-10-CM | POA: Diagnosis not present

## 2022-05-24 DIAGNOSIS — Z48812 Encounter for surgical aftercare following surgery on the circulatory system: Secondary | ICD-10-CM | POA: Diagnosis not present

## 2022-05-24 DIAGNOSIS — I5020 Unspecified systolic (congestive) heart failure: Secondary | ICD-10-CM | POA: Diagnosis not present

## 2022-05-24 NOTE — Progress Notes (Signed)
Cardiac Individual Treatment Plan  Patient Details  Name: Lisa Hale MRN: [id number] Date of Birth: 1945-08-22 Referring Provider:   Flowsheet Row Cardiac Rehab from 05/24/2022 in [facility] Cardiac and Pulmonary Rehab  Referring Provider Dr. Kathlyn Sacramento       Initial Encounter Date:  Flowsheet Row Cardiac Rehab from 05/24/2022 in [facility] Cardiac and Pulmonary Rehab  Date 05/24/22       Visit Diagnosis: ST elevation myocardial infarction (STEMI), unspecified artery ([place])  Status post coronary artery stent placement  Patient's Home Medications on Admission:  Current Outpatient Medications:    albuterol (PROVENTIL HFA;VENTOLIN HFA) 108 (90 Base) MCG/ACT inhaler, Inhale into the lungs every 6 (six) hours as needed for wheezing or shortness of breath., Disp: , Rfl:    atorvastatin (LIPITOR) 80 MG tablet, Take 1 tablet (80 mg total) by mouth daily., Disp: 90 tablet, Rfl: 3   carvedilol (COREG) 3.125 MG tablet, Take 1 tablet (3.125 mg total) by mouth 2 (two) times daily with a meal., Disp: 60 tablet, Rfl: 1   cholecalciferol 25 MCG (1000 UT) TABS, Take by mouth daily., Disp: , Rfl:    co-enzyme Q-10 30 MG capsule, Take 100 mg by mouth daily. Takes with atorvastatin., Disp: , Rfl:    dextromethorphan-guaiFENesin (ROBITUSSIN-DM) 10-100 MG/5ML liquid, Take 5 mLs by mouth every 4 (four) hours as needed for cough., Disp: , Rfl:    fluticasone (FLOVENT HFA) 220 MCG/ACT inhaler, Inhale into the lungs 2 (two) times daily as needed., Disp: , Rfl:    furosemide (LASIX) 20 MG tablet, Take 1 tablet (20 mg total) by mouth as needed (as needed for increased shortness of breath or weight gain greater than 3 pounds overnight). (Patient not taking: Reported on 05/22/2022), Disp: 90 tablet, Rfl: 3   lansoprazole (PREVACID) 15 MG capsule, Take 15 mg by mouth daily. Take around 8- 9am., Disp: , Rfl:    levothyroxine (SYNTHROID) 100 MCG tablet, Take 100 mcg by mouth daily., Disp: , Rfl:    montelukast  (SINGULAIR) 10 MG tablet, Take 10 mg by mouth at bedtime., Disp: , Rfl:    niacinamide 500 MG tablet, Take 500 mg by mouth daily. (Patient not taking: Reported on 05/22/2022), Disp: , Rfl:    nitroGLYCERIN (NITROSTAT) 0.4 MG SL tablet, Take 1 tablet under the tongue every 5 minutes for up to 3 doses as directed, Disp: 25 tablet, Rfl: 1   phenazopyridine (PYRIDIUM) 200 MG tablet, Take 200 mg by mouth 3 (three) times daily as needed. (Patient not taking: Reported on 05/22/2022), Disp: , Rfl:    sacubitril-valsartan (ENTRESTO) 24-26 MG, Take 1 tablet by mouth 2 (two) times daily., Disp: 60 tablet, Rfl: 1   spironolactone (ALDACTONE) 25 MG tablet, Take 0.5 tablets (12.5 mg total) by mouth daily., Disp: 45 tablet, Rfl: 3   ticagrelor (BRILINTA) 90 MG TABS tablet, Take 1 tablet (90 mg total) by mouth 2 (two) times daily., Disp: 180 tablet, Rfl: 3   Turmeric Curcumin 500 MG CAPS, Take by mouth daily. (Patient not taking: Reported on 05/22/2022), Disp: , Rfl:   Past Medical History: Past Medical History:  Diagnosis Date   Asthma    CHF (congestive heart failure) (HCC)    Coronary artery disease    GERD (gastroesophageal reflux disease)    Glaucoma    Hypertension    Hypothyroidism    Scoliosis    Seasonal allergies    Skin cancer    Vertigo    Avg: 1x/mo    Tobacco Use:  Social History   Tobacco Use  Smoking Status Former   Packs/day: 0.25   Years: 15.00   Total pack years: 3.75   Types: Cigarettes   Quit date: 10/29/2021   Years since quitting: 0.5  Smokeless Tobacco Never  Tobacco Comments   She has been on and off smoking since she was 77 years old.    Labs: Review Flowsheet       Latest Ref Rng & Units 04/03/2022  Labs for ITP Cardiac and Pulmonary Rehab  Cholestrol 0 - 200 mg/dL 283   LDL (calc) 0 - 99 mg/dL 201   HDL-C >40 mg/dL 57   Trlycerides <150 mg/dL 126   Hemoglobin A1c 4.8 - 5.6 % 5.7      Exercise Target Goals: Exercise Program Goal: Individual exercise  prescription set using results from initial 6 min walk test and THRR while considering  patient's activity barriers and safety.   Exercise Prescription Goal: Initial exercise prescription builds to 30-45 minutes a day of aerobic activity, 2-3 days per week.  Home exercise guidelines will be given to patient during program as part of exercise prescription that the participant will acknowledge.   Education: Aerobic Exercise: - Group verbal and visual presentation on the components of exercise prescription. Introduces F.I.T.T principle from ACSM for exercise prescriptions.  Reviews F.I.T.T. principles of aerobic exercise including progression. Written material given at graduation.   Education: Resistance Exercise: - Group verbal and visual presentation on the components of exercise prescription. Introduces F.I.T.T principle from ACSM for exercise prescriptions  Reviews F.I.T.T. principles of resistance exercise including progression. Written material given at graduation.    Education: Exercise & Equipment Safety: - Individual verbal instruction and demonstration of equipment use and safety with use of the equipment. Flowsheet Row Cardiac Rehab from 05/24/2022 in [facility] Cardiac and Pulmonary Rehab  Date 05/22/22  Educator [facility]  Instruction Review Code 1- Verbalizes Understanding       Education: Exercise Physiology & General Exercise Guidelines: - Group verbal and written instruction with models to review the exercise physiology of the cardiovascular system and associated critical values. Provides general exercise guidelines with specific guidelines to those with heart or lung disease.    Education: Flexibility, Balance, Mind/Body Relaxation: - Group verbal and visual presentation with interactive activity on the components of exercise prescription. Introduces F.I.T.T principle from ACSM for exercise prescriptions. Reviews F.I.T.T. principles of flexibility and balance exercise training including  progression. Also discusses the mind body connection.  Reviews various relaxation techniques to help reduce and manage stress (i.e. Deep breathing, progressive muscle relaxation, and visualization). Balance handout provided to take home. Written material given at graduation.   Activity Barriers & Risk Stratification:  Activity Barriers & Cardiac Risk Stratification - 05/24/22 1532       Activity Barriers & Cardiac Risk Stratification   Activity Barriers Neck/Spine Problems   Scoliosis, Metal Plate in R leg   Cardiac Risk Stratification High             6 Minute Walk:  6 Minute Walk     Row Name 05/24/22 1530         6 Minute Walk   Phase Initial     Distance 975 feet     Walk Time 6 minutes     # of Rest Breaks 0     MPH 1.85     METS 1.92     RPE 12     Perceived Dyspnea  0     VO2  Peak 6.71     Symptoms No     Resting HR 58 bpm     Resting BP 114/70     Resting Oxygen Saturation  98 %     Exercise Oxygen Saturation  during 6 min walk 96 %     Max Ex. HR 97 bpm     Max Ex. BP 140/82     2 Minute Post BP 116/78              Oxygen Initial Assessment:   Oxygen Re-Evaluation:   Oxygen Discharge (Final Oxygen Re-Evaluation):   Initial Exercise Prescription:  Initial Exercise Prescription - 05/24/22 1500       Date of Initial Exercise RX and Referring Provider   Date 05/24/22    Referring Provider Dr. Kathlyn [place]      Oxygen   Maintain Oxygen Saturation 88% or higher      NuStep   Level 2    SPM 80    Minutes 15    METs 1.92      Biostep-RELP   Level 1    SPM 50    Minutes 15    METs 1.92      Track   Laps 22    Minutes 15    METs 2.2      Prescription Details   Frequency (times per week) 2    Duration Progress to 30 minutes of continuous aerobic without signs/symptoms of physical distress      Intensity   THRR 40-80% of Max Heartrate 92-126    Ratings of Perceived Exertion 11-13    Perceived Dyspnea 0-4      Progression    Progression Continue to progress workloads to maintain intensity without signs/symptoms of physical distress.      Resistance Training   Training Prescription Yes    Weight 2 lb    Reps 10-15             Perform Capillary Blood Glucose checks as needed.  Exercise Prescription Changes:   Exercise Comments:   Exercise Goals and Review:   Exercise Goals     Row Name 05/24/22 1533             Exercise Goals   Increase Physical Activity Yes       Intervention Provide advice, education, support and counseling about physical activity/exercise needs.;Develop an individualized exercise prescription for aerobic and resistive training based on initial evaluation findings, risk stratification, comorbidities and participant's personal goals.       Expected Outcomes Short Term: Attend rehab on a regular basis to increase amount of physical activity.;Long Term: Add in home exercise to make exercise part of routine and to increase amount of physical activity.;Long Term: Exercising regularly at least 3-5 days a week.       Increase Strength and Stamina Yes       Intervention Provide advice, education, support and counseling about physical activity/exercise needs.;Develop an individualized exercise prescription for aerobic and resistive training based on initial evaluation findings, risk stratification, comorbidities and participant's personal goals.       Expected Outcomes Short Term: Increase workloads from initial exercise prescription for resistance, speed, and METs.;Short Term: Perform resistance training exercises routinely during rehab and add in resistance training at home;Long Term: Improve cardiorespiratory fitness, muscular endurance and strength as measured by increased METs and functional capacity (6MWT)       Able to understand and use rate of perceived exertion (RPE) scale Yes  Intervention Provide education and explanation on how to use RPE scale       Expected Outcomes  Short Term: Able to use RPE daily in rehab to express subjective intensity level;Long Term:  Able to use RPE to guide intensity level when exercising independently       Able to understand and use Dyspnea scale Yes       Intervention Provide education and explanation on how to use Dyspnea scale       Expected Outcomes Short Term: Able to use Dyspnea scale daily in rehab to express subjective sense of shortness of breath during exertion;Long Term: Able to use Dyspnea scale to guide intensity level when exercising independently       Knowledge and understanding of Target Heart Rate Range (THRR) Yes       Intervention Provide education and explanation of THRR including how the numbers were predicted and where they are located for reference       Expected Outcomes Long Term: Able to use THRR to govern intensity when exercising independently;Short Term: Able to use daily as guideline for intensity in rehab;Short Term: Able to state/look up THRR       Able to check pulse independently Yes       Intervention Provide education and demonstration on how to check pulse in carotid and radial arteries.;Review the importance of being able to check your own pulse for safety during independent exercise       Expected Outcomes Short Term: Able to explain why pulse checking is important during independent exercise;Long Term: Able to check pulse independently and accurately       Understanding of Exercise Prescription Yes       Intervention Provide education, explanation, and written materials on patient's individual exercise prescription       Expected Outcomes Short Term: Able to explain program exercise prescription;Long Term: Able to explain home exercise prescription to exercise independently                Exercise Goals Re-Evaluation :   Discharge Exercise Prescription (Final Exercise Prescription Changes):   Nutrition:  Target Goals: Understanding of nutrition guidelines, daily intake of sodium  '1500mg'$ , cholesterol '200mg'$ , calories 30% from fat and 7% or less from saturated fats, daily to have 5 or more servings of fruits and vegetables.  Education: All About Nutrition: -Group instruction provided by verbal, written material, interactive activities, discussions, models, and posters to present general guidelines for heart healthy nutrition including fat, fiber, MyPlate, the role of sodium in heart healthy nutrition, utilization of the nutrition label, and utilization of this knowledge for meal planning. Follow up email sent as well. Written material given at graduation. Flowsheet Row Cardiac Rehab from 05/24/2022 in [facility] Cardiac and Pulmonary Rehab  Education need identified 05/24/22       Biometrics:  Pre Biometrics - 05/24/22 1534       Pre Biometrics   Height '5\' 3"'$  (1.6 m)    Weight 154 lb 3.2 oz (69.9 kg)    Waist Circumference 41 inches    Hip Circumference 41 inches    Waist to Hip Ratio 1 %    BMI (Calculated) 27.32    Single Leg Stand 30 seconds   L             Nutrition Therapy Plan and Nutrition Goals:  Nutrition Therapy & Goals - 05/24/22 1525       Intervention Plan   Intervention Prescribe, educate and counsel regarding individualized specific dietary  modifications aiming towards targeted core components such as weight, hypertension, lipid management, diabetes, heart failure and other comorbidities.    Expected Outcomes Short Term Goal: Understand basic principles of dietary content, such as calories, fat, sodium, cholesterol and nutrients.;Short Term Goal: A plan has been developed with personal nutrition goals set during dietitian appointment.;Long Term Goal: Adherence to prescribed nutrition plan.             Nutrition Assessments:  MEDIFICTS Score Key: ?70 Need to make dietary changes  40-70 Heart Healthy Diet ? 40 Therapeutic Level Cholesterol Diet  Flowsheet Row Cardiac Rehab from 05/24/2022 in [facility] Cardiac and Pulmonary Rehab  Picture  Your Plate Total Score on Admission 62      Picture Your Plate Scores: <16 Unhealthy dietary pattern with much room for improvement. 41-50 Dietary pattern unlikely to meet recommendations for good health and room for improvement. 51-60 More healthful dietary pattern, with some room for improvement.  >60 Healthy dietary pattern, although there may be some specific behaviors that could be improved.    Nutrition Goals Re-Evaluation:   Nutrition Goals Discharge (Final Nutrition Goals Re-Evaluation):   Psychosocial: Target Goals: Acknowledge presence or absence of significant depression and/or stress, maximize coping skills, provide positive support system. Participant is able to verbalize types and ability to use techniques and skills needed for reducing stress and depression.   Education: Stress, Anxiety, and Depression - Group verbal and visual presentation to define topics covered.  Reviews how body is impacted by stress, anxiety, and depression.  Also discusses healthy ways to reduce stress and to treat/manage anxiety and depression.  Written material given at graduation.   Education: Sleep Hygiene -Provides group verbal and written instruction about how sleep can affect your health.  Define sleep hygiene, discuss sleep cycles and impact of sleep habits. Review good sleep hygiene tips.    Initial Review & Psychosocial Screening:  Initial Psych Review & Screening - 05/22/22 1119       Initial Review   Current issues with None Identified      Family Dynamics   Good Support System? Yes    Comments She can look to her daughter and gradaughter for support. She has the typical days where she can feel depressed and stressed but in a "normal".      Barriers   Psychosocial barriers to participate in program There are no identifiable barriers or psychosocial needs.      Screening Interventions   Interventions Encouraged to exercise;To provide support and resources with identified  psychosocial needs;Provide feedback about the scores to participant    Expected Outcomes Short Term goal: Utilizing psychosocial counselor, staff and physician to assist with identification of specific Stressors or current issues interfering with healing process. Setting desired goal for each stressor or current issue identified.;Long Term Goal: Stressors or current issues are controlled or eliminated.;Long Term goal: The participant improves quality of Life and PHQ9 Scores as seen by post scores and/or verbalization of changes;Short Term goal: Identification and review with participant of any Quality of Life or Depression concerns found by scoring the questionnaire.             Quality of Life Scores:   Quality of Life - 05/24/22 1524       Quality of Life   Select Quality of Life      Quality of Life Scores   Health/Function Pre 23.11 %    Socioeconomic Pre 30 %    Psych/Spiritual Pre 29.14 %  Family Pre 18 %    GLOBAL Pre 25.22 %            Scores of 19 and below usually indicate a poorer quality of life in these areas.  A difference of  2-3 points is a clinically meaningful difference.  A difference of 2-3 points in the total score of the Quality of Life Index has been associated with significant improvement in overall quality of life, self-image, physical symptoms, and general health in studies assessing change in quality of life.  PHQ-9: Review Flowsheet       05/24/2022  Depression screen PHQ 2/9  Decreased Interest 0  Down, Depressed, Hopeless 0  PHQ - 2 Score 0  Altered sleeping 0  Tired, decreased energy 0  Change in appetite 1  Feeling bad or failure about yourself  0  Trouble concentrating 0  Moving slowly or fidgety/restless 0  Suicidal thoughts 0  PHQ-9 Score 1  Difficult doing work/chores Not difficult at all   Interpretation of Total Score  Total Score Depression Severity:  1-4 = Minimal depression, 5-9 = Mild depression, 10-14 = Moderate  depression, 15-19 = Moderately severe depression, 20-27 = Severe depression   Psychosocial Evaluation and Intervention:  Psychosocial Evaluation - 05/22/22 1121       Psychosocial Evaluation & Interventions   Interventions Encouraged to exercise with the program and follow exercise prescription;Relaxation education;Stress management education    Comments She can look to her daughter and gradaughter for support. She has the typical days where she can feel depressed and stressed but in a "normal".    Expected Outcomes Short: Start HeartTrack to help with mood. Long: Maintain a healthy mental state    Continue Psychosocial Services  Follow up required by staff             Psychosocial Re-Evaluation:   Psychosocial Discharge (Final Psychosocial Re-Evaluation):   Vocational Rehabilitation: Provide vocational rehab assistance to qualifying candidates.   Vocational Rehab Evaluation & Intervention:   Education: Education Goals: Education classes will be provided on a variety of topics geared toward better understanding of heart health and risk factor modification. Participant will state understanding/return demonstration of topics presented as noted by education test scores.  Learning Barriers/Preferences:  Learning Barriers/Preferences - 05/22/22 1112       Learning Barriers/Preferences   Learning Barriers None    Learning Preferences None             General Cardiac Education Topics:  AED/CPR: - Group verbal and written instruction with the use of models to demonstrate the basic use of the AED with the basic ABC's of resuscitation.   Anatomy and Cardiac Procedures: - Group verbal and visual presentation and models provide information about basic cardiac anatomy and function. Reviews the testing methods done to diagnose heart disease and the outcomes of the test results. Describes the treatment choices: Medical Management, Angioplasty, or Coronary Bypass Surgery for  treating various heart conditions including Myocardial Infarction, Angina, Valve Disease, and Cardiac Arrhythmias.  Written material given at graduation. Flowsheet Row Cardiac Rehab from 05/24/2022 in [facility] Cardiac and Pulmonary Rehab  Education need identified 05/24/22       Medication Safety: - Group verbal and visual instruction to review commonly prescribed medications for heart and lung disease. Reviews the medication, class of the drug, and side effects. Includes the steps to properly store meds and maintain the prescription regimen.  Written material given at graduation.   Intimacy: - Group verbal instruction through game  format to discuss how heart and lung disease can affect sexual intimacy. Written material given at graduation..   Know Your Numbers and Heart Failure: - Group verbal and visual instruction to discuss disease risk factors for cardiac and pulmonary disease and treatment options.  Reviews associated critical values for Overweight/Obesity, Hypertension, Cholesterol, and Diabetes.  Discusses basics of heart failure: signs/symptoms and treatments.  Introduces Heart Failure Zone chart for action plan for heart failure.  Written material given at graduation.   Infection Prevention: - Provides verbal and written material to individual with discussion of infection control including proper hand washing and proper equipment cleaning during exercise session. Flowsheet Row Cardiac Rehab from 05/24/2022 in [facility] Cardiac and Pulmonary Rehab  Date 05/22/22  Educator [facility]  Instruction Review Code 1- Verbalizes Understanding       Falls Prevention: - Provides verbal and written material to individual with discussion of falls prevention and safety. Flowsheet Row Cardiac Rehab from 05/24/2022 in [facility] Cardiac and Pulmonary Rehab  Date 05/22/22  Educator [facility]  Instruction Review Code 1- Verbalizes Understanding       Other: -Provides group and verbal instruction on various topics  (see comments)   Knowledge Questionnaire Score:  Knowledge Questionnaire Score - 05/24/22 1519       Knowledge Questionnaire Score   Pre Score 22/26             Core Components/Risk Factors/Patient Goals at Admission:  Personal Goals and Risk Factors at Admission - 05/24/22 1520       Core Components/Risk Factors/Patient Goals on Admission    Weight Management Yes;Weight Maintenance    Intervention Weight Management: Develop a combined nutrition and exercise program designed to reach desired caloric intake, while maintaining appropriate intake of nutrient and fiber, sodium and fats, and appropriate energy expenditure required for the weight goal.;Weight Management: Provide education and appropriate resources to help participant work on and attain dietary goals.;Weight Management/Obesity: Establish reasonable short term and long term weight goals.    Admit Weight 154 lb 3.2 oz (69.9 kg)    Goal Weight: Short Term 150 lb (68 kg)    Goal Weight: Long Term 145 lb (65.8 kg)    Expected Outcomes Short Term: Continue to assess and modify interventions until short term weight is achieved;Long Term: Adherence to nutrition and physical activity/exercise program aimed toward attainment of established weight goal;Weight Maintenance: Understanding of the daily nutrition guidelines, which includes 25-35% calories from fat, 7% or less cal from saturated fats, less than '200mg'$  cholesterol, less than 1.5gm of sodium, & 5 or more servings of fruits and vegetables daily;Understanding recommendations for meals to include 15-35% energy as protein, 25-35% energy from fat, 35-60% energy from carbohydrates, less than '200mg'$  of dietary cholesterol, 20-35 gm of total fiber daily;Understanding of distribution of calorie intake throughout the day with the consumption of 4-5 meals/snacks    Hypertension Yes    Intervention Provide education on lifestyle modifcations including regular physical activity/exercise, weight  management, moderate sodium restriction and increased consumption of fresh fruit, vegetables, and low fat dairy, alcohol moderation, and smoking cessation.;Monitor prescription use compliance.    Expected Outcomes Short Term: Continued assessment and intervention until BP is < 140/23m HG in hypertensive participants. < 130/816mHG in hypertensive participants with diabetes, heart failure or chronic kidney disease.;Long Term: Maintenance of blood pressure at goal levels.    Lipids Yes    Intervention Provide education and support for participant on nutrition & aerobic/resistive exercise along with prescribed medications to achieve LDL <  $'70mg'P$ , HDL >'40mg'$ .    Expected Outcomes Short Term: Participant states understanding of desired cholesterol values and is compliant with medications prescribed. Participant is following exercise prescription and nutrition guidelines.;Long Term: Cholesterol controlled with medications as prescribed, with individualized exercise RX and with personalized nutrition plan. Value goals: LDL < '70mg'$ , HDL > 40 mg.             Education:Diabetes - Individual verbal and written instruction to review signs/symptoms of diabetes, desired ranges of glucose level fasting, after meals and with exercise. Acknowledge that pre and post exercise glucose checks will be done for 3 sessions at entry of program.   Core Components/Risk Factors/Patient Goals Review:    Core Components/Risk Factors/Patient Goals at Discharge (Final Review):    ITP Comments:  ITP Comments     Row Name 05/22/22 1111 05/24/22 1516         ITP Comments Virtual Visit completed. Patient informed on EP and RD appointment and 6 Minute walk test. Patient also informed of patient health questionnaires on My Chart. Patient Verbalizes understanding. Visit diagnosis can be found in [facility] 04/03/2022. Completed 6MWT and gym orientation. Initial ITP created and sent for review to Dr. Emily Filbert, Medical Director.                Comments: Initial ITP

## 2022-05-24 NOTE — Patient Instructions (Signed)
Patient Instructions  Patient Details  Name: Lisa Hale MRN: [id number] Date of Birth: 08-26-1945 Referring Provider:  Wellington Hampshire, MD  Below are your personal goals for exercise, nutrition, and risk factors. Our goal is to help you stay on track towards obtaining and maintaining these goals. We will be discussing your progress on these goals with you throughout the program.  Initial Exercise Prescription:  Initial Exercise Prescription - 05/24/22 1500       Date of Initial Exercise RX and Referring Provider   Date 05/24/22    Referring Provider Dr. Kathlyn Sacramento      Oxygen   Maintain Oxygen Saturation 88% or higher      NuStep   Level 2    SPM 80    Minutes 15    METs 1.92      Biostep-RELP   Level 1    SPM 50    Minutes 15    METs 1.92      Track   Laps 22    Minutes 15    METs 2.2      Prescription Details   Frequency (times per week) 2    Duration Progress to 30 minutes of continuous aerobic without signs/symptoms of physical distress      Intensity   THRR 40-80% of Max Heartrate 92-126    Ratings of Perceived Exertion 11-13    Perceived Dyspnea 0-4      Progression   Progression Continue to progress workloads to maintain intensity without signs/symptoms of physical distress.      Resistance Training   Training Prescription Yes    Weight 2 lb    Reps 10-15             Exercise Goals: Frequency: Be able to perform aerobic exercise two to three times per week in program working toward 2-5 days per week of home exercise.  Intensity: Work with a perceived exertion of 11 (fairly light) - 15 (hard) while following your exercise prescription.  We will make changes to your prescription with you as you progress through the program.   Duration: Be able to do 30 to 45 minutes of continuous aerobic exercise in addition to a 5 minute warm-up and a 5 minute cool-down routine.   Nutrition Goals: Your personal nutrition goals will be  established when you do your nutrition analysis with the dietician.  The following are general nutrition guidelines to follow: Cholesterol < '200mg'$ /day Sodium < '1500mg'$ /day Fiber: Women over 50 yrs - 21 grams per day  Personal Goals:  Personal Goals and Risk Factors at Admission - 05/24/22 1520       Core Components/Risk Factors/Patient Goals on Admission    Weight Management Yes;Weight Maintenance    Intervention Weight Management: Develop a combined nutrition and exercise program designed to reach desired caloric intake, while maintaining appropriate intake of nutrient and fiber, sodium and fats, and appropriate energy expenditure required for the weight goal.;Weight Management: Provide education and appropriate resources to help participant work on and attain dietary goals.;Weight Management/Obesity: Establish reasonable short term and long term weight goals.    Admit Weight 154 lb 3.2 oz (69.9 kg)    Goal Weight: Short Term 150 lb (68 kg)    Goal Weight: Long Term 145 lb (65.8 kg)    Expected Outcomes Short Term: Continue to assess and modify interventions until short term weight is achieved;Long Term: Adherence to nutrition and physical activity/exercise program aimed toward attainment of established weight goal;Weight Maintenance: Understanding  of the daily nutrition guidelines, which includes 25-35% calories from fat, 7% or less cal from saturated fats, less than '200mg'$  cholesterol, less than 1.5gm of sodium, & 5 or more servings of fruits and vegetables daily;Understanding recommendations for meals to include 15-35% energy as protein, 25-35% energy from fat, 35-60% energy from carbohydrates, less than '200mg'$  of dietary cholesterol, 20-35 gm of total fiber daily;Understanding of distribution of calorie intake throughout the day with the consumption of 4-5 meals/snacks    Hypertension Yes    Intervention Provide education on lifestyle modifcations including regular physical activity/exercise,  weight management, moderate sodium restriction and increased consumption of fresh fruit, vegetables, and low fat dairy, alcohol moderation, and smoking cessation.;Monitor prescription use compliance.    Expected Outcomes Short Term: Continued assessment and intervention until BP is < 140/97m HG in hypertensive participants. < 130/879mHG in hypertensive participants with diabetes, heart failure or chronic kidney disease.;Long Term: Maintenance of blood pressure at goal levels.    Lipids Yes    Intervention Provide education and support for participant on nutrition & aerobic/resistive exercise along with prescribed medications to achieve LDL '70mg'$ , HDL >'40mg'$ .    Expected Outcomes Short Term: Participant states understanding of desired cholesterol values and is compliant with medications prescribed. Participant is following exercise prescription and nutrition guidelines.;Long Term: Cholesterol controlled with medications as prescribed, with individualized exercise RX and with personalized nutrition plan. Value goals: LDL < '70mg'$ , HDL > 40 mg.             Tobacco Use Initial Evaluation: Social History   Tobacco Use  Smoking Status Former   Packs/day: 0.25   Years: 15.00   Total pack years: 3.75   Types: Cigarettes   Quit date: 10/29/2021   Years since quitting: 0.5  Smokeless Tobacco Never  Tobacco Comments   She has been on and off smoking since she was [age]ears old.    Exercise Goals and Review:  Exercise Goals     Row Name 05/24/22 1533             Exercise Goals   Increase Physical Activity Yes       Intervention Provide advice, education, support and counseling about physical activity/exercise needs.;Develop an individualized exercise prescription for aerobic and resistive training based on initial evaluation findings, risk stratification, comorbidities and participant's personal goals.       Expected Outcomes Short Term: Attend rehab on a regular basis to increase amount of  physical activity.;Long Term: Add in home exercise to make exercise part of routine and to increase amount of physical activity.;Long Term: Exercising regularly at least 3-5 days a week.       Increase Strength and Stamina Yes       Intervention Provide advice, education, support and counseling about physical activity/exercise needs.;Develop an individualized exercise prescription for aerobic and resistive training based on initial evaluation findings, risk stratification, comorbidities and participant's personal goals.       Expected Outcomes Short Term: Increase workloads from initial exercise prescription for resistance, speed, and METs.;Short Term: Perform resistance training exercises routinely during rehab and add in resistance training at home;Long Term: Improve cardiorespiratory fitness, muscular endurance and strength as measured by increased METs and functional capacity (6MWT)       Able to understand and use rate of perceived exertion (RPE) scale Yes       Intervention Provide education and explanation on how to use RPE scale       Expected Outcomes Short Term: Able to  use RPE daily in rehab to express subjective intensity level;Long Term:  Able to use RPE to guide intensity level when exercising independently       Able to understand and use Dyspnea scale Yes       Intervention Provide education and explanation on how to use Dyspnea scale       Expected Outcomes Short Term: Able to use Dyspnea scale daily in rehab to express subjective sense of shortness of breath during exertion;Long Term: Able to use Dyspnea scale to guide intensity level when exercising independently       Knowledge and understanding of Target Heart Rate Range (THRR) Yes       Intervention Provide education and explanation of THRR including how the numbers were predicted and where they are located for reference       Expected Outcomes Long Term: Able to use THRR to govern intensity when exercising independently;Short Term:  Able to use daily as guideline for intensity in rehab;Short Term: Able to state/look up THRR       Able to check pulse independently Yes       Intervention Provide education and demonstration on how to check pulse in carotid and radial arteries.;Review the importance of being able to check your own pulse for safety during independent exercise       Expected Outcomes Short Term: Able to explain why pulse checking is important during independent exercise;Long Term: Able to check pulse independently and accurately       Understanding of Exercise Prescription Yes       Intervention Provide education, explanation, and written materials on patient's individual exercise prescription       Expected Outcomes Short Term: Able to explain program exercise prescription;Long Term: Able to explain home exercise prescription to exercise independently

## 2022-05-26 ENCOUNTER — Telehealth: Payer: Self-pay | Admitting: Cardiovascular Disease

## 2022-05-26 NOTE — Telephone Encounter (Signed)
Returned the call to the number provided. They stated that they did not call and did not have this patient listed with them.

## 2022-05-26 NOTE — Telephone Encounter (Signed)
Pt c/o medication issue:  1. Name of Medication:   ticagrelor (BRILINTA) 90 MG TABS tablet   2. How are you currently taking this medication (dosage and times per day)?   3. Are you having a reaction (difficulty breathing--STAT)?   4. What is your medication issue?   Caller stated they will need clarification on the instructions for this medication.   Ref#  [id number].

## 2022-05-29 ENCOUNTER — Encounter: Payer: Self-pay | Admitting: Pharmacist

## 2022-05-29 ENCOUNTER — Other Ambulatory Visit (HOSPITAL_COMMUNITY): Payer: Self-pay

## 2022-05-29 ENCOUNTER — Other Ambulatory Visit: Payer: Self-pay

## 2022-05-29 ENCOUNTER — Encounter: Payer: Medicare Other | Admitting: *Deleted

## 2022-05-29 ENCOUNTER — Other Ambulatory Visit
Admission: RE | Admit: 2022-05-29 | Discharge: 2022-05-29 | Disposition: A | Discharge status: Home / Self Care | Payer: Medicare Other | Source: Home / Self Care | Attending: Physician Assistant | Admitting: Physician Assistant

## 2022-05-29 ENCOUNTER — Other Ambulatory Visit: Payer: Self-pay | Admitting: Physician Assistant

## 2022-05-29 DIAGNOSIS — I5020 Unspecified systolic (congestive) heart failure: Secondary | ICD-10-CM

## 2022-05-29 DIAGNOSIS — I2510 Atherosclerotic heart disease of native coronary artery without angina pectoris: Secondary | ICD-10-CM

## 2022-05-29 DIAGNOSIS — Z955 Presence of coronary angioplasty implant and graft: Secondary | ICD-10-CM

## 2022-05-29 DIAGNOSIS — I2102 ST elevation (STEMI) myocardial infarction involving left anterior descending coronary artery: Secondary | ICD-10-CM

## 2022-05-29 DIAGNOSIS — R7989 Other specified abnormal findings of blood chemistry: Secondary | ICD-10-CM | POA: Insufficient documentation

## 2022-05-29 DIAGNOSIS — E785 Hyperlipidemia, unspecified: Secondary | ICD-10-CM

## 2022-05-29 DIAGNOSIS — I255 Ischemic cardiomyopathy: Secondary | ICD-10-CM

## 2022-05-29 DIAGNOSIS — I252 Old myocardial infarction: Secondary | ICD-10-CM | POA: Diagnosis not present

## 2022-05-29 DIAGNOSIS — I213 ST elevation (STEMI) myocardial infarction of unspecified site: Secondary | ICD-10-CM

## 2022-05-29 LAB — BASIC METABOLIC PANEL
Anion gap: 9 (ref 5–15)
BUN: 18 mg/dL (ref 8–23)
CO2: 22 mmol/L (ref 22–32)
Calcium: 8.9 mg/dL (ref 8.9–10.3)
Chloride: 105 mmol/L (ref 98–111)
Creatinine, Ser: 0.97 mg/dL (ref 0.44–1.00)
GFR, Estimated: >60 mL/min (ref >60)
Glucose, Bld: 101 mg/dL — ABNORMAL HIGH (ref 70–99)
Potassium: 4.3 mmol/L (ref 3.5–5.1)
Sodium: 136 mmol/L (ref 135–145)

## 2022-05-29 LAB — HEPATIC FUNCTION PANEL
ALT: 159 U/L — ABNORMAL HIGH (ref 0–44)
AST: 139 U/L — ABNORMAL HIGH (ref 15–41)
Albumin: 3.4 g/dL — ABNORMAL LOW (ref 3.5–5.0)
Alkaline Phosphatase: 303 U/L — ABNORMAL HIGH (ref 38–126)
Bilirubin, Direct: 0.2 mg/dL (ref 0.0–0.2)
Indirect Bilirubin: 0.7 mg/dL (ref 0.3–0.9)
Total Bilirubin: 0.9 mg/dL (ref 0.3–1.2)
Total Protein: 7.6 g/dL (ref 6.5–8.1)

## 2022-05-29 MED ORDER — TICAGRELOR 90 MG PO TABS
90.0000 mg | ORAL_TABLET | Freq: Two times a day (BID) | ORAL | 1 refills | Status: DC
  Filled 2022-05-29: qty 60, 30d supply, fill #0

## 2022-05-29 NOTE — Progress Notes (Signed)
Daily Session Note  Patient Details  Name: Lisa Hale MRN: [id number] Date of Birth: October 10, 1945 Referring Provider:   Flowsheet Row Cardiac Rehab from 05/24/2022 in [facility] Cardiac and Pulmonary Rehab  Referring Provider Dr. Kathlyn Sacramento       Encounter Date: 05/29/2022  Check In:  Session Check In - 05/29/22 1131       Check-In   Supervising physician immediately available to respond to emergencies See telemetry face sheet for immediately available ER MD    Location [facility]-Cardiac & Pulmonary Rehab    Staff Present Darlyne Russian, RN, Doyce Para, BS, ACSM CEP, Exercise Physiologist;Meredith Sherryll Burger, RN BSN;Noah Tickle, BS, Exercise Physiologist    Virtual Visit No    Medication changes reported     No    Fall or balance concerns reported    No    Tobacco Cessation No Change    Warm-up and Cool-down Performed on first and last piece of equipment    Resistance Training Performed Yes    VAD Patient? No    PAD/SET Patient? No      Pain Assessment   Currently in Pain? No/denies                Social History   Tobacco Use  Smoking Status Former   Packs/day: 0.25   Years: 15.00   Total pack years: 3.75   Types: Cigarettes   Quit date: 10/29/2021   Years since quitting: 0.5  Smokeless Tobacco Never  Tobacco Comments   She has been on and off smoking since she was 77 years old.    Goals Met:  Independence with exercise equipment Exercise tolerated well No report of concerns or symptoms today Strength training completed today  Goals Unmet:  Not Applicable  Comments: First full day of exercise!  Patient was oriented to gym and equipment including functions, settings, policies, and procedures.  Patient's individual exercise prescription and treatment plan were reviewed.  All starting workloads were established based on the results of the 6 minute walk test done at initial orientation visit.  The plan for exercise progression was also introduced and  progression will be customized based on patient's performance and goals.    Dr. Emily Filbert is Medical Director for [facility].  Dr. Ottie Glazier is Medical Director for [facility] Pulmonary Rehabilitation.

## 2022-05-30 ENCOUNTER — Telehealth: Payer: Self-pay | Admitting: Cardiovascular Disease

## 2022-05-30 ENCOUNTER — Other Ambulatory Visit: Payer: Self-pay

## 2022-05-30 NOTE — Telephone Encounter (Signed)
Spoke with Pharmacist Lisa Hale form [facility] who reports they received two different prescription for Brilinta. One stating Brilinta 90 mg daily and one for BID.  Per chart review, on 1/16, Lisa Faith, PA noted the following. Lisa Hale made aware and verbalized understanding.   Continue DAPT with aspirin 81 mg daily and ticagrelor 90 mg twice daily without interruption for a minimum of 12 months from date of PCI

## 2022-05-30 NOTE — Telephone Encounter (Signed)
Spoke with patient and reviewed provider recommendations to stop atorvastatin and co-Q10. Also  instructed her to reach out to her PCP for further assessment and management of those elevated readings. She verbalized understanding with no further questions at this time.

## 2022-05-30 NOTE — Telephone Encounter (Signed)
Pt states that she received a call after speaking with nurse earlier about labs. She was told she would have a return call to discuss how she has stopped taking the medication, Atorvastatin. She is requesting return call.

## 2022-05-30 NOTE — Telephone Encounter (Signed)
Pt c/o medication issue:  1. Name of Medication:   ticagrelor (BRILINTA) 90 MG TABS tablet    2. How are you currently taking this medication (dosage and times per day)?   3. Are you having a reaction (difficulty breathing--STAT)? no  4. What is your medication issue? Megan with [employer] calling to verify directions. She says the received 2 different directions and need to verify which one is correct. She says the office can either call back or just send an updated prescription with the corrected instructions. Phone: [phone].

## 2022-05-30 NOTE — Telephone Encounter (Signed)
Yes, she should hold atorvastatin.  Can also stop co-Q10.  Given the degree of abnormal liver function, she should still be evaluated by her PCP to assess if further imaging is indicated.

## 2022-05-30 NOTE — Telephone Encounter (Signed)
Pt called to follow up about the discuss regarding lab work this morning Pt stated she was told to restart Lipitor.  After review of chart on 1/16 pt was instructed to restart atorvastatin 80 mg due to improved liver function. Pt is questioning  if she should stop atorvastatin now due to recent lab work. If so, due she need to still consult with pcp. Pt also questioning if she stopped CO Q10 as well.  Will forward to PA

## 2022-05-31 ENCOUNTER — Encounter: Payer: Medicare Other | Admitting: *Deleted

## 2022-05-31 ENCOUNTER — Other Ambulatory Visit (HOSPITAL_COMMUNITY): Payer: Self-pay

## 2022-05-31 ENCOUNTER — Other Ambulatory Visit: Payer: Self-pay

## 2022-05-31 DIAGNOSIS — I252 Old myocardial infarction: Secondary | ICD-10-CM | POA: Diagnosis not present

## 2022-05-31 DIAGNOSIS — I213 ST elevation (STEMI) myocardial infarction of unspecified site: Secondary | ICD-10-CM

## 2022-05-31 DIAGNOSIS — Z955 Presence of coronary angioplasty implant and graft: Secondary | ICD-10-CM

## 2022-05-31 NOTE — Progress Notes (Signed)
Daily Session Note  Patient Details  Name: Lisa Hale MRN: [id number] Date of Birth: 1945-07-09 Referring Provider:   Flowsheet Row Cardiac Rehab from 05/24/2022 in [facility] Cardiac and Pulmonary Rehab  Referring Provider Dr. Kathlyn Sacramento       Encounter Date: 05/31/2022  Check In:  Session Check In - 05/31/22 1129       Check-In   Supervising physician immediately available to respond to emergencies See telemetry face sheet for immediately available ER MD    Location [facility]-Cardiac & Pulmonary Rehab    Staff Present Darlyne Russian, RN, ADN;Meredith Sherryll Burger, RN Abel Presto, MS, ACSM CEP, Exercise Physiologist;Noah Tickle, BS, Exercise Physiologist    Virtual Visit No    Medication changes reported     Yes    Comments d/c atorvastatin and CoQ10    Fall or balance concerns reported    No    Tobacco Cessation No Change    Warm-up and Cool-down Performed on first and last piece of equipment    Resistance Training Performed Yes    VAD Patient? No    PAD/SET Patient? No      Pain Assessment   Currently in Pain? No/denies                Social History   Tobacco Use  Smoking Status Former   Packs/day: 0.25   Years: 15.00   Total pack years: 3.75   Types: Cigarettes   Quit date: 10/29/2021   Years since quitting: 0.5  Smokeless Tobacco Never  Tobacco Comments   She has been on and off smoking since she was 77 years old.    Goals Met:  Independence with exercise equipment Exercise tolerated well No report of concerns or symptoms today Strength training completed today  Goals Unmet:  Not Applicable  Comments: Pt able to follow exercise prescription today without complaint.  Will continue to monitor for progression.    Dr. Emily Filbert is Medical Director for [facility].  Dr. Ottie Glazier is Medical Director for [facility] Pulmonary Rehabilitation.

## 2022-06-05 ENCOUNTER — Encounter: Payer: Medicare Other | Attending: Cardiovascular Disease | Admitting: *Deleted

## 2022-06-05 DIAGNOSIS — Z955 Presence of coronary angioplasty implant and graft: Secondary | ICD-10-CM | POA: Diagnosis not present

## 2022-06-05 DIAGNOSIS — I213 ST elevation (STEMI) myocardial infarction of unspecified site: Secondary | ICD-10-CM | POA: Insufficient documentation

## 2022-06-05 NOTE — Progress Notes (Signed)
Daily Session Note  Patient Details  Name: Lisa Hale MRN: [id number] Date of Birth: 15-Jun-1945 Referring Provider:   Flowsheet Row Cardiac Rehab from 05/24/2022 in [facility] Cardiac and Pulmonary Rehab  Referring Provider Dr. Kathlyn Sacramento       Encounter Date: 06/05/2022  Check In:  Session Check In - 06/05/22 1110       Check-In   Supervising physician immediately available to respond to emergencies See telemetry face sheet for immediately available ER MD    Location [facility]-Cardiac & Pulmonary Rehab    Staff Present Darlyne Russian, RN, Doyce Para, BS, ACSM CEP, Exercise Physiologist;Meredith Sherryll Burger, RN BSN;Noah Tickle, BS, Exercise Physiologist    Virtual Visit No    Medication changes reported     No    Fall or balance concerns reported    No    Tobacco Cessation No Change    Warm-up and Cool-down Performed on first and last piece of equipment    Resistance Training Performed Yes    VAD Patient? No    PAD/SET Patient? No      Pain Assessment   Currently in Pain? No/denies                Social History   Tobacco Use  Smoking Status Former   Packs/day: 0.25   Years: 15.00   Total pack years: 3.75   Types: Cigarettes   Quit date: 10/29/2021   Years since quitting: 0.6  Smokeless Tobacco Never  Tobacco Comments   She has been on and off smoking since she was 77 years old.    Goals Met:  Independence with exercise equipment Exercise tolerated well No report of concerns or symptoms today Strength training completed today  Goals Unmet:  Not Applicable  Comments: Pt able to follow exercise prescription today without complaint.  Will continue to monitor for progression.    Dr. Emily Filbert is Medical Director for [facility].  Dr. Ottie Glazier is Medical Director for [facility] Pulmonary Rehabilitation.

## 2022-06-07 ENCOUNTER — Encounter: Payer: Medicare Other | Admitting: *Deleted

## 2022-06-07 DIAGNOSIS — Z955 Presence of coronary angioplasty implant and graft: Secondary | ICD-10-CM

## 2022-06-07 DIAGNOSIS — I213 ST elevation (STEMI) myocardial infarction of unspecified site: Secondary | ICD-10-CM | POA: Diagnosis not present

## 2022-06-07 NOTE — Progress Notes (Signed)
Daily Session Note  Patient Details  Name: Lisa Hale MRN: [id number] Date of Birth: 1946/03/14 Referring Provider:   Flowsheet Row Cardiac Rehab from 05/24/2022 in [facility] Cardiac and Pulmonary Rehab  Referring Provider Dr. Kathlyn Sacramento       Encounter Date: 06/07/2022  Check In:  Session Check In - 06/07/22 1129       Check-In   Supervising physician immediately available to respond to emergencies See telemetry face sheet for immediately available ER MD    Location [facility]-Cardiac & Pulmonary Rehab    Staff Present Darlyne Russian, RN, ADN;Meredith Sherryll Burger, RN BSN;Jessica Luan Pulling, MA, RCEP, CCRP, Bertram Gala, MS, ACSM CEP, Exercise Physiologist;Noah Tickle, BS, Exercise Physiologist    Virtual Visit No    Medication changes reported     No    Fall or balance concerns reported    No    Tobacco Cessation No Change    Warm-up and Cool-down Performed on first and last piece of equipment    Resistance Training Performed Yes    VAD Patient? No    PAD/SET Patient? No      Pain Assessment   Currently in Pain? No/denies                Social History   Tobacco Use  Smoking Status Former   Packs/day: 0.25   Years: 15.00   Total pack years: 3.75   Types: Cigarettes   Quit date: 10/29/2021   Years since quitting: 0.6  Smokeless Tobacco Never  Tobacco Comments   She has been on and off smoking since she was 77 years old.    Goals Met:  Independence with exercise equipment Exercise tolerated well No report of concerns or symptoms today Strength training completed today  Goals Unmet:  Not Applicable  Comments: Pt able to follow exercise prescription today without complaint.  Will continue to monitor for progression.    Dr. Emily Filbert is Medical Director for [facility].  Dr. Ottie Glazier is Medical Director for [facility] Pulmonary Rehabilitation.

## 2022-06-12 ENCOUNTER — Encounter: Payer: Medicare Other | Admitting: *Deleted

## 2022-06-12 DIAGNOSIS — I213 ST elevation (STEMI) myocardial infarction of unspecified site: Secondary | ICD-10-CM

## 2022-06-12 DIAGNOSIS — Z955 Presence of coronary angioplasty implant and graft: Secondary | ICD-10-CM

## 2022-06-12 NOTE — Progress Notes (Signed)
Daily Session Note  Patient Details  Name: Lisa Hale MRN: [id number] Date of Birth: 23-Dec-1945 Referring Provider:   Flowsheet Row Cardiac Rehab from 05/24/2022 in [facility] Cardiac and Pulmonary Rehab  Referring Provider Dr. Kathlyn Sacramento       Encounter Date: 06/12/2022  Check In:  Session Check In - 06/12/22 1124       Check-In   Supervising physician immediately available to respond to emergencies See telemetry face sheet for immediately available ER MD    Location [facility]-Cardiac & Pulmonary Rehab    Staff Present Renita Papa, RN BSN;Noah Tickle, BS, Exercise Physiologist;Joseph Wetmore, RCP,RRT,BSRT;Jessica [place], [place], RCEP, CCRP, CCET    Virtual Visit No    Medication changes reported     No    Fall or balance concerns reported    No    Warm-up and Cool-down Performed on first and last piece of equipment    Resistance Training Performed Yes    VAD Patient? No    PAD/SET Patient? No      Pain Assessment   Currently in Pain? No/denies                Social History   Tobacco Use  Smoking Status Former   Packs/day: 0.25   Years: 15.00   Total pack years: 3.75   Types: Cigarettes   Quit date: 10/29/2021   Years since quitting: 0.6  Smokeless Tobacco Never  Tobacco Comments   She has been on and off smoking since she was 77 years old.    Goals Met:  Independence with exercise equipment Exercise tolerated well No report of concerns or symptoms today Strength training completed today  Goals Unmet:  Not Applicable  Comments: Pt able to follow exercise prescription today without complaint.  Will continue to monitor for progression.    Dr. Emily Filbert is Medical Director for [place].  Dr. Ottie Glazier is Medical Director for [facility] Pulmonary Rehabilitation.

## 2022-06-14 ENCOUNTER — Encounter: Payer: Medicare Other | Admitting: *Deleted

## 2022-06-14 DIAGNOSIS — I213 ST elevation (STEMI) myocardial infarction of unspecified site: Secondary | ICD-10-CM | POA: Diagnosis not present

## 2022-06-14 DIAGNOSIS — Z955 Presence of coronary angioplasty implant and graft: Secondary | ICD-10-CM

## 2022-06-14 NOTE — Progress Notes (Signed)
Daily Session Note  Patient Details  Name: Lisa Hale MRN: [id number] Date of Birth: 01/27/1946 Referring Provider:   Flowsheet Row Cardiac Rehab from 05/24/2022 in [facility] Cardiac and Pulmonary Rehab  Referring Provider Dr. Kathlyn Sacramento       Encounter Date: 06/14/2022  Check In:  Session Check In - 06/14/22 1142       Check-In   Supervising physician immediately available to respond to emergencies See telemetry face sheet for immediately available ER MD    Location [facility]-Cardiac & Pulmonary Rehab    Staff Present Darlyne Russian, RN, ADN;Joseph Hood, RCP,RRT,BSRT;Noah Tickle, BS, Exercise Physiologist    Virtual Visit No    Medication changes reported     No    Fall or balance concerns reported    No    Tobacco Cessation No Change    Warm-up and Cool-down Performed on first and last piece of equipment    Resistance Training Performed Yes    VAD Patient? No    PAD/SET Patient? No      Pain Assessment   Currently in Pain? No/denies                Social History   Tobacco Use  Smoking Status Former   Packs/day: 0.25   Years: 15.00   Total pack years: 3.75   Types: Cigarettes   Quit date: 10/29/2021   Years since quitting: 0.6  Smokeless Tobacco Never  Tobacco Comments   She has been on and off smoking since she was 77 years old.    Goals Met:  Independence with exercise equipment Exercise tolerated well No report of concerns or symptoms today Strength training completed today  Goals Unmet:  Not Applicable  Comments: Pt able to follow exercise prescription today without complaint.  Will continue to monitor for progression.    Dr. Emily Filbert is Medical Director for [place].  Dr. Ottie Glazier is Medical Director for [facility] Pulmonary Rehabilitation.

## 2022-06-15 NOTE — Progress Notes (Unsigned)
Cardiology Office Note    Date:  06/15/2022   ID:  Lisa Hale, Lisa Hale 03-17-1946, MRN [id number]  PCP:  Lynnell Jude, MD  Cardiologist:  Kathlyn Sacramento, MD  Electrophysiologist:  None   Chief Complaint: Follow up  History of Present Illness:   Lisa Hale is a 77 y.o. female with history of CAD with anterior ST elevation MI in 03/2022 status post PCI/DES to the LAD, HFrEF secondary to ICM, HTN, HLD, hypothyroidism, scoliosis, prior tobacco use quitting in 1999, and GERD who presents for follow-up of CAD and cardiomyopathy.   She was admitted to the hospital from 12/4 through 04/05/2022 with an anterior ST elevation MI.  Leading up to her admission, she reported substernal chest pain and tightness that radiated to the bilateral arms the day prior.  She was initially evaluated at an urgent care where EKG showed an anterior STEMI with subsequent code STEMI activation and transport to the ED via EMS.  Emergent LHC on 04/03/2022 showed an occluded mid LAD.  In addition, there was severe stenosis in the proximal RCA.  There was moderately reduced LV systolic function with an EF of 35% with akinesis of the mid to distal anterior wall, apex, and distal inferior wall.  Moderately elevated LVEDP of 27 mmHg.  She underwent successful PCI/DES to the mid LAD and successful OCT-guided PCI/DES to the RCA.  Echo during the admission demonstrated an EF of 25 to 30%, akinesis of the mid and distal anterior septum, apical lateral segment, mid inferoseptal segment, apical anterior segment, and apical inferior segment with hypokinesis of the mid inferolateral, mid anterolateral, mid anterior, and mid inferior segments.  There was also grade 1 diastolic dysfunction, mildly reduced RV systolic function with akinesis of the apex, moderately enlarged RV cavity size, mild mitral regurgitation, and aortic valve sclerosis with mild stenosis.  High-sensitivity troponin peaked at greater than 24,000.   LifeVest was placed prior to discharge, however this had to be discontinued secondary to blisters noted at skin contact points.  She was seen in hospital follow-up on 05/02/2022 and was without symptoms of angina or decompensation.  She felt like she was getting better day by day.  Baseline cognitive deficits were trending back to baseline.  She was adherent and tolerating cardiac medications.  She was transitioned from losartan to [facility] with continuation of carvedilol and furosemide.  Follow-up labs showed persistent transaminitis with newly elevated ALT leading to the suspension of atorvastatin.  With this, LFTs did improve some leading to the resumption of atorvastatin.  She was last seen in the office on 05/16/2022 and remained without symptoms of angina or cardiac decompensation.  She was started on spironolactone 12.5 mg daily.  Repeat LFTs were obtained on statin therapy which again showed elevations in AST/ALT leading to the recommendation to hold atorvastatin and co-Q43moing forward.  ***   Labs independently reviewed: 05/2022 - potassium 4.3, BUN 18, serum creatinine 0.97, albumin 3.4, AST 139, ALT 159 03/2022 - Hgb 11.4, PLT 276, LP(a) 91.1, A1c 5.7, magnesium 2.0, TC 283, TG 126, HDL 57, LDL 201  Past Medical History:  Diagnosis Date   Asthma    CHF (congestive heart failure) (HCC)    Coronary artery disease    GERD (gastroesophageal reflux disease)    Glaucoma    Hypertension    Hypothyroidism    Scoliosis    Seasonal allergies    Skin cancer    Vertigo    Avg: 1x/mo    Past  Surgical History:  Procedure Laterality Date   CATARACT EXTRACTION W/PHACO Right 03/13/2022   Procedure: CATARACT EXTRACTION PHACO AND INTRAOCULAR LENS PLACEMENT (IOC) RIGHT WITH HYDRUS MICROSTENT  6.72  00:37.7;  Surgeon: Eulogio Bear, MD;  Location: [facility];  Service: Ophthalmology;  Laterality: Right;   CATARACT EXTRACTION W/PHACO Left 03/27/2022   Procedure: CATARACT EXTRACTION  PHACO AND INTRAOCULAR LENS PLACEMENT (IOC) LEFT WITH IOL PLUS HYDRUS MICROSTENT 5.21 00:30.9;  Surgeon: Eulogio Bear, MD;  Location: [facility];  Service: Ophthalmology;  Laterality: Left;   COLONOSCOPY     COLONOSCOPY WITH PROPOFOL N/A 03/12/2018   Procedure: COLONOSCOPY WITH PROPOFOL;  Surgeon: Virgel Manifold, MD;  Location: [place];  Service: Endoscopy;  Laterality: N/A;   CORONARY/GRAFT ACUTE MI REVASCULARIZATION N/A 04/03/2022   Procedure: Coronary/Graft Acute MI Revascularization;  Surgeon: Wellington Hampshire, MD;  Location: [facility];  Service: Cardiovascular;  Laterality: N/A;   LEFT HEART CATH AND CORONARY ANGIOGRAPHY N/A 04/03/2022   Procedure: LEFT HEART CATH AND CORONARY ANGIOGRAPHY;  Surgeon: Wellington Hampshire, MD;  Location: [facility];  Service: Cardiovascular;  Laterality: N/A;   ORIF TIBIA & FIBULA FRACTURES     TONSILLECTOMY      Current Medications: No outpatient medications have been marked as taking for the 06/19/22 encounter (Appointment) with Rise Mu, PA-C.    Allergies:   Sunflower oil, Tetracycline, Bactrim [sulfamethoxazole-trimethoprim], and Pineapple   Social History   Socioeconomic History   Marital status: Widowed    Spouse name: Not on file   Number of children: Not on file   Years of education: Not on file   Highest education level: Not on file  Occupational History   Not on file  Tobacco Use   Smoking status: Former    Packs/day: 0.25    Years: 15.00    Total pack years: 3.75    Types: Cigarettes    Quit date: 10/29/2021    Years since quitting: 0.6   Smokeless tobacco: Never   Tobacco comments:    She has been on and off smoking since she was 77 years old.  Vaping Use   Vaping Use: Never used  Substance and Sexual Activity   Alcohol use: Yes    Comment: couple x/yr   Drug use: Never   Sexual activity: Not on file  Other Topics Concern   Not on file  Social History Narrative   Not on  file   Social Determinants of Health   Financial Resource Strain: Not on file  Food Insecurity: No Food Insecurity (04/03/2022)   Hunger Vital Sign    Worried About Running Out of Food in the Last Year: Never true    Ran Out of Food in the Last Year: Never true  Transportation Needs: No Transportation Needs (04/03/2022)   PRAPARE - Hydrologist (Medical): No    Lack of Transportation (Non-Medical): No  Physical Activity: Not on file  Stress: Not on file  Social Connections: Not on file     Family History:  The patient's family history includes Colon polyps in her brother.  ROS:   12-point review of systems is negative unless otherwise noted in HPI.   EKGs/Labs/Other Studies Reviewed:    Studies reviewed were summarized above. The additional studies were reviewed today:  LHC 04/03/2022:   2nd Diag lesion is 60% stenosed.   Mid LAD to Dist LAD lesion is 100% stenosed.   Mid  LAD lesion is 40% stenosed.   Dist LAD lesion is 60% stenosed.   Prox RCA lesion is 60% stenosed.   Prox RCA to Mid RCA lesion is 95% stenosed.   A drug-eluting stent was successfully placed using a STENT ONYX FRONTIER 2.25X38.   A drug-eluting stent was successfully placed using a STENT ONYX FRONTIER 3.0X38.   Post intervention, there is a 0% residual stenosis.   Post intervention, there is a 0% residual stenosis.   Post intervention, there is a 0% residual stenosis.   There is moderate left ventricular systolic dysfunction.   LV end diastolic pressure is moderately elevated.   1.  Anterior ST elevation myocardial infarction due to an occluded mid LAD.  In addition, there is severe stenosis in the proximal right coronary artery. 2.  Moderately reduced LV systolic function with an EF of 35% with akinesis of the mid to distal anterior wall, apex and distal inferior wall.  Moderately elevated left ventricular end-diastolic pressure with an LVEDP of 27 mmHg. 3.  Successful  angioplasty and drug-eluting stent placement to the mid LAD. 4.  Successful OCT guided PCI and drug-eluting stent placement to the right coronary artery.   Recommendations: Dual antiplatelet therapy for at least 12 months. Aggressive treatment of risk factors. I started small dose carvedilol. __________   2D echo 04/04/2022: 1. Left ventricular ejection fraction, by estimation, is 25 to 30%. The  left ventricle has severely decreased function. The left ventricle  demonstrates regional wall motion abnormalities (see scoring  diagram/findings for description). Left ventricular  diastolic parameters are consistent with Grade I diastolic dysfunction  (impaired relaxation).   2. Right ventricular systolic function is mildly reduced with akinesis of  the apex. The right ventricular size is moderately enlarged.   3. The mitral valve is normal in structure. Mild mitral valve  regurgitation.   4. The aortic valve has an indeterminant number of cusps. There is mild  calcification of the aortic valve. There is moderate thickening of the  aortic valve. Aortic valve regurgitation is not visualized. Mild aortic  valve stenosis. Aortic valve area, by  VTI measures 1.29 cm. Aortic valve mean gradient measures 5.0 mmHg.    EKG:  EKG is ordered today.  The EKG ordered today demonstrates ***  Recent Labs: 04/03/2022: Magnesium 2.0 04/06/2022: Hemoglobin 11.4; Platelets 276 05/29/2022: ALT 159; BUN 18; Creatinine, Ser 0.97; Potassium 4.3; Sodium 136  Recent Lipid Panel    Component Value Date/Time   CHOL 283 (H) 04/03/2022 1003   TRIG 126 04/03/2022 1003   HDL 57 04/03/2022 1003   CHOLHDL 5.0 04/03/2022 1003   VLDL 25 04/03/2022 1003   LDLCALC 201 (H) 04/03/2022 1003    PHYSICAL EXAM:    VS:  There were no vitals taken for this visit.  BMI: There is no height or weight on file to calculate BMI.  Physical Exam  Wt Readings from Last 3 Encounters:  05/24/22 154 lb 3.2 oz (69.9 kg)   05/16/22 150 lb (68 kg)  05/02/22 152 lb (68.9 kg)     ASSESSMENT & PLAN:   CAD with recent anterior ST elevation MI:   HFrEF secondary to ICM:   HTN: Blood pressure   HLD: LDL 201 with goal being less than 55.  Atorvastatin has been held secondary to abnormal LFTs.   Elevated LFTs:    {Are you ordering a CV Procedure (e.g. stress test, cath, DCCV, TEE, etc)?   Press F2        :  [id number]     Disposition: F/u with Dr. Fletcher Anon or an APP in ***.   Medication Adjustments/Labs and Tests Ordered: Current medicines are reviewed at length with the patient today.  Concerns regarding medicines are outlined above. Medication changes, Labs and Tests ordered today are summarized above and listed in the Patient Instructions accessible in Encounters.   Signed, Christell Faith, PA-C 06/15/2022 1:38 PM     [facility] [address] [place] [address] [place],  [address] [phone]

## 2022-06-19 ENCOUNTER — Ambulatory Visit (INDEPENDENT_AMBULATORY_CARE_PROVIDER_SITE_OTHER): Payer: Medicare Other

## 2022-06-19 ENCOUNTER — Other Ambulatory Visit
Admission: RE | Admit: 2022-06-19 | Discharge: 2022-06-19 | Disposition: A | Discharge status: Home / Self Care | Payer: Medicare Other | Source: Ambulatory Visit | Attending: Physician Assistant | Admitting: Physician Assistant

## 2022-06-19 ENCOUNTER — Encounter: Payer: Medicare Other | Admitting: *Deleted

## 2022-06-19 ENCOUNTER — Encounter: Payer: Self-pay | Admitting: Physician Assistant

## 2022-06-19 ENCOUNTER — Ambulatory Visit: Payer: Medicare Other | Attending: Physician Assistant | Admitting: Physician Assistant

## 2022-06-19 VITALS — BP 110/70 | HR 76 | Ht 63.0 in | Wt 152.0 lb

## 2022-06-19 DIAGNOSIS — R7989 Other specified abnormal findings of blood chemistry: Secondary | ICD-10-CM | POA: Diagnosis present

## 2022-06-19 DIAGNOSIS — E785 Hyperlipidemia, unspecified: Secondary | ICD-10-CM

## 2022-06-19 DIAGNOSIS — R42 Dizziness and giddiness: Secondary | ICD-10-CM | POA: Diagnosis present

## 2022-06-19 DIAGNOSIS — I2510 Atherosclerotic heart disease of native coronary artery without angina pectoris: Secondary | ICD-10-CM

## 2022-06-19 DIAGNOSIS — R471 Dysarthria and anarthria: Secondary | ICD-10-CM | POA: Diagnosis present

## 2022-06-19 DIAGNOSIS — I213 ST elevation (STEMI) myocardial infarction of unspecified site: Secondary | ICD-10-CM | POA: Diagnosis not present

## 2022-06-19 DIAGNOSIS — I255 Ischemic cardiomyopathy: Secondary | ICD-10-CM | POA: Insufficient documentation

## 2022-06-19 DIAGNOSIS — I5020 Unspecified systolic (congestive) heart failure: Secondary | ICD-10-CM | POA: Insufficient documentation

## 2022-06-19 DIAGNOSIS — Z955 Presence of coronary angioplasty implant and graft: Secondary | ICD-10-CM

## 2022-06-19 DIAGNOSIS — I2102 ST elevation (STEMI) myocardial infarction involving left anterior descending coronary artery: Secondary | ICD-10-CM | POA: Diagnosis present

## 2022-06-19 LAB — COMPREHENSIVE METABOLIC PANEL
ALT: 50 U/L — ABNORMAL HIGH (ref 0–44)
AST: 45 U/L — ABNORMAL HIGH (ref 15–41)
Albumin: 3.7 g/dL (ref 3.5–5.0)
Alkaline Phosphatase: 160 U/L — ABNORMAL HIGH (ref 38–126)
Anion gap: 9 (ref 5–15)
BUN: 29 mg/dL — ABNORMAL HIGH (ref 8–23)
CO2: 20 mmol/L — ABNORMAL LOW (ref 22–32)
Calcium: 9.3 mg/dL (ref 8.9–10.3)
Chloride: 108 mmol/L (ref 98–111)
Creatinine, Ser: 1.3 mg/dL — ABNORMAL HIGH (ref 0.44–1.00)
GFR, Estimated: 42 mL/min — ABNORMAL LOW (ref >60)
Glucose, Bld: 102 mg/dL — ABNORMAL HIGH (ref 70–99)
Potassium: 4.2 mmol/L (ref 3.5–5.1)
Sodium: 137 mmol/L (ref 135–145)
Total Bilirubin: 0.8 mg/dL (ref 0.3–1.2)
Total Protein: 7.9 g/dL (ref 6.5–8.1)

## 2022-06-19 LAB — CBC
HCT: 40 % (ref 36.0–46.0)
Hemoglobin: 12.8 g/dL (ref 12.0–15.0)
MCH: 29.2 pg (ref 26.0–34.0)
MCHC: 32 g/dL (ref 30.0–36.0)
MCV: 91.1 fL (ref 80.0–100.0)
Platelets: 326 10*3/uL (ref 150–400)
RBC: 4.39 MIL/uL (ref 3.87–5.11)
RDW: 16.3 % — ABNORMAL HIGH (ref 11.5–15.5)
WBC: 8.1 10*3/uL (ref 4.0–10.5)
nRBC: 0 % (ref 0.0–0.2)

## 2022-06-19 MED ORDER — CARVEDILOL 3.125 MG PO TABS: 3.1250 mg | ORAL_TABLET | Freq: Two times a day (BID) | ORAL | 0 refills | Status: DC

## 2022-06-19 MED ORDER — ASPIRIN 81 MG PO TBEC: 81.0000 mg | DELAYED_RELEASE_TABLET | Freq: Every day | ORAL | 3 refills | Status: AC

## 2022-06-19 NOTE — Progress Notes (Signed)
Daily Session Note  Patient Details  Name: Lisa Hale MRN: [id number] Date of Birth: August 01, 1945 Referring Provider:   Flowsheet Row Cardiac Rehab from 05/24/2022 in [facility] Cardiac and Pulmonary Rehab  Referring Provider Dr. Kathlyn Sacramento       Encounter Date: 06/19/2022  Check In:  Session Check In - 06/19/22 1101       Check-In   Supervising physician immediately available to respond to emergencies See telemetry face sheet for immediately available ER MD    Location [facility]-Cardiac & Pulmonary Rehab    Staff Present Darlyne Russian, RN, Doyce Para, BS, ACSM CEP, Exercise Physiologist;Meredith Sherryll Burger, RN Abel Presto, MS, ACSM CEP, Exercise Physiologist;Noah Tickle, BS, Exercise Physiologist    Virtual Visit No    Medication changes reported     No    Fall or balance concerns reported    No    Tobacco Cessation No Change    Warm-up and Cool-down Performed on first and last piece of equipment    Resistance Training Performed Yes    VAD Patient? No    PAD/SET Patient? No      Pain Assessment   Currently in Pain? No/denies                Social History   Tobacco Use  Smoking Status Former   Packs/day: 0.25   Years: 15.00   Total pack years: 3.75   Types: Cigarettes   Quit date: 10/29/2021   Years since quitting: 0.6  Smokeless Tobacco Never  Tobacco Comments   She has been on and off smoking since she was 77 years old.    Goals Met:  Independence with exercise equipment Exercise tolerated well No report of concerns or symptoms today Strength training completed today  Goals Unmet:  Not Applicable  Comments: Pt able to follow exercise prescription today without complaint.  Will continue to monitor for progression.    Dr. Emily Filbert is Medical Director for [facility].  Dr. Ottie Glazier is Medical Director for [facility] Pulmonary Rehabilitation.

## 2022-06-19 NOTE — Patient Instructions (Signed)
Medication Instructions:  Your physician has recommended you make the following change in your medication:   Take 1 Aspirin tablet (81 mg total) by mouth daily  *If you need a refill on your cardiac medications before your next appointment, please call your pharmacy*   Lab Work: Your physician recommends that you to get lab work today: [place] and CMP Nature conservation officer at [facility] 1st desk on the right to check in (REGISTRATION)  Lab hours: Monday- Friday (7:30 am- 5:30 pm)   If you have labs (blood work) drawn today and your tests are completely normal, you will receive your results only by: MyChart Message (if you have MyChart) OR A paper copy in the mail If you have any lab test that is abnormal or we need to change your treatment, we will call you to review the results.   Testing/Procedures: Your physician has recommended that you wear a Zio monitor.   This monitor is a medical device that records the heart's electrical activity. Doctors most often use these monitors to diagnose arrhythmias. Arrhythmias are problems with the speed or rhythm of the heartbeat. The monitor is a small device applied to your chest. You can wear one while you do your normal daily activities. While wearing this monitor if you have any symptoms to push the button and record what you felt. Once you have worn this monitor for the period of time provider prescribed (Usually 14 days), you will return the monitor device in the postage paid box. Once it is returned they will download the data collected and provide [place] with a report which the provider will then review and we will call you with those results. Important tips:  Avoid showering during the first 24 hours of wearing the monitor. Avoid excessive sweating to help maximize wear time. Do not submerge the device, no hot tubs, and no swimming pools. Keep any lotions or oils away from the patch. After 24 hours you may shower with the patch on. Take brief showers  with your back facing the shower head.  Do not remove patch once it has been placed because that will interrupt data and decrease adhesive wear time. Push the button when you have any symptoms and write down what you were feeling. Once you have completed wearing your monitor, remove and place into box which has postage paid and place in your outgoing mailbox.  If for some reason you have misplaced your box then call our office and we can provide another box and/or mail it off for you.  Heart Monitor:  Length of Wear: 14 days  Your monitor will be mailed to your home address within 3-5 business days. However, if you have not received your monitor after 5 business days please send [place] a MyChart message or call the office at [phone], so we may follow up on this for you.   Your physician has recommended that you wear a Zio XT monitor.   This monitor is a medical device that records the heart's electrical activity. Doctors most often use these monitors to diagnose arrhythmias. Arrhythmias are problems with the speed or rhythm of the heartbeat. The monitor is a small device applied to your chest. You can wear one while you do your normal daily activities. While wearing this monitor if you have any symptoms to push the button and record what you felt. Once you have worn this monitor for the period of time provider prescribed (Usually 14 days), you will return the monitor device  in the postage paid box. Once it is returned they will download the data collected and provide [place] with a report which the provider will then review and we will call you with those results. Important tips:  Avoid showering during the first 24 hours of wearing the monitor. Avoid excessive sweating to help maximize wear time. Do not submerge the device, no hot tubs, and no swimming pools. Keep any lotions or oils away from the patch. After 24 hours you may shower with the patch on. Take brief showers with your back facing the  shower head.  Do not remove patch once it has been placed because that will interrupt data and decrease adhesive wear time. Push the button when you have any symptoms and write down what you were feeling. Once you have completed wearing your monitor, remove and place into box which has postage paid and place in your outgoing mailbox.  If for some reason you have misplaced your box then call our office and we can provide another box and/or mail it off for you.      You have and order for a MRI of the brain please contact [phone] for scheduling   Follow-Up: At [facility], you and your health needs are our priority.  As part of our continuing mission to provide you with exceptional heart care, we have created designated Provider Care Teams.  These Care Teams include your primary Cardiologist (physician) and Advanced Practice Providers (APPs -  Physician Assistants and Nurse Practitioners) who all work together to provide you with the care you need, when you need it.  We recommend signing up for the patient portal called "MyChart".  Sign up information is provided on this After Visit Summary.  MyChart is used to connect with patients for Virtual Visits (Telemedicine).  Patients are able to view lab/test results, encounter notes, upcoming appointments, etc.  Non-urgent messages can be sent to your provider as well.   To learn more about what you can do with MyChart, go to [email or web address].    Your next appointment:   1 month(s)  Provider:   Christell Faith, PA-C   Other Instructions - None

## 2022-06-21 ENCOUNTER — Encounter: Payer: Medicare Other | Admitting: *Deleted

## 2022-06-21 ENCOUNTER — Telehealth: Payer: Self-pay | Admitting: *Deleted

## 2022-06-21 ENCOUNTER — Encounter: Payer: Self-pay | Admitting: *Deleted

## 2022-06-21 DIAGNOSIS — I213 ST elevation (STEMI) myocardial infarction of unspecified site: Secondary | ICD-10-CM

## 2022-06-21 DIAGNOSIS — Z955 Presence of coronary angioplasty implant and graft: Secondary | ICD-10-CM

## 2022-06-21 NOTE — Telephone Encounter (Signed)
-----   Message from Rise Mu, PA-C sent at 06/19/2022 12:56 PM EST ----- Potassium at goal Renal function mildly elevated, though consistent with prior readings Liver function is mildly elevated, though improved off statin therapy Blood count normal  Recommendations: -Increase water intake -Continue to avoid statin therapy -We discussed PCSK9 inhibitor in the office and patient's daughter is willing to provide these injections for her.  Please check with her insurance to see if they will cover Repatha 140 mg every 2 weeks or Praluent 75 mg every 2 weeks -If she notes any further blood in her stool following a BM, I would like for her to contact her PCP

## 2022-06-21 NOTE — Telephone Encounter (Signed)
Reviewed results and recommendations with patient and she verbalized understanding and was agreeable with plan. She will check with insurance to see which one would be covered and then give [place] a call back to get that sent in to her pharmacy. She was appreciative for the call with no further questions at this time.

## 2022-06-21 NOTE — Progress Notes (Signed)
Cardiac Individual Treatment Plan  Patient Details  Name: Lisa Hale MRN: [id number] Date of Birth: 1945-06-08 Referring Provider:   Flowsheet Row Cardiac Rehab from 05/24/2022 in [facility] Cardiac and Pulmonary Rehab  Referring Provider Dr. Kathlyn [place]       Initial Encounter Date:  Flowsheet Row Cardiac Rehab from 05/24/2022 in [facility] Cardiac and Pulmonary Rehab  Date 05/24/22       Visit Diagnosis: ST elevation myocardial infarction (STEMI), unspecified artery [facility])  Status post coronary artery stent placement  Patient's Home Medications on Admission:  Current Outpatient Medications:    albuterol (PROVENTIL HFA;VENTOLIN HFA) 108 (90 Base) MCG/ACT inhaler, Inhale into the lungs every 6 (six) hours as needed for wheezing or shortness of breath., Disp: , Rfl:    aspirin EC 81 MG tablet, Take 1 tablet (81 mg total) by mouth daily. Swallow whole., Disp: 90 tablet, Rfl: 3   carvedilol (COREG) 3.125 MG tablet, Take 1 tablet (3.125 mg total) by mouth 2 (two) times daily with a meal., Disp: 180 tablet, Rfl: 0   cholecalciferol 25 MCG (1000 UT) TABS, Take by mouth daily., Disp: , Rfl:    dextromethorphan-guaiFENesin (ROBITUSSIN-DM) 10-100 MG/5ML liquid, Take 5 mLs by mouth every 4 (four) hours as needed for cough., Disp: , Rfl:    fluticasone (FLOVENT HFA) 220 MCG/ACT inhaler, Inhale into the lungs 2 (two) times daily as needed., Disp: , Rfl:    furosemide (LASIX) 20 MG tablet, Take 1 tablet (20 mg total) by mouth as needed (as needed for increased shortness of breath or weight gain greater than 3 pounds overnight)., Disp: 90 tablet, Rfl: 3   lansoprazole (PREVACID) 15 MG capsule, Take 15 mg by mouth daily. Take around 8- 9am., Disp: , Rfl:    levothyroxine (SYNTHROID) 100 MCG tablet, Take 100 mcg by mouth daily., Disp: , Rfl:    montelukast (SINGULAIR) 10 MG tablet, Take 10 mg by mouth at bedtime., Disp: , Rfl:    nitroGLYCERIN (NITROSTAT) 0.4 MG SL tablet, Take 1 tablet under the  tongue every 5 minutes for up to 3 doses as directed, Disp: 25 tablet, Rfl: 1   phenazopyridine (PYRIDIUM) 200 MG tablet, Take 200 mg by mouth 3 (three) times daily as needed., Disp: , Rfl:    sacubitril-valsartan (ENTRESTO) 24-26 MG, Take 1 tablet by mouth 2 (two) times daily., Disp: 60 tablet, Rfl: 1   spironolactone (ALDACTONE) 25 MG tablet, Take 0.5 tablets (12.5 mg total) by mouth daily., Disp: 45 tablet, Rfl: 3   ticagrelor (BRILINTA) 90 MG TABS tablet, Take 1 tablet (90 mg total) by mouth 2 (two) times daily., Disp: 60 tablet, Rfl: 1  Past Medical History: Past Medical History:  Diagnosis Date   Asthma    CHF (congestive heart failure) (HCC)    Coronary artery disease    GERD (gastroesophageal reflux disease)    Glaucoma    Hypertension    Hypothyroidism    Scoliosis    Seasonal allergies    Skin cancer    Vertigo    Avg: 1x/mo    Tobacco Use: Social History   Tobacco Use  Smoking Status Former   Packs/day: 0.25   Years: 15.00   Total pack years: 3.75   Types: Cigarettes   Quit date: 10/29/2021   Years since quitting: 0.6  Smokeless Tobacco Never  Tobacco Comments   She has been on and off smoking since she was 77 years old.    Labs: Review Flowsheet  Latest Ref Rng & Units 04/03/2022  Labs for ITP Cardiac and Pulmonary Rehab  Cholestrol 0 - 200 mg/dL 283   LDL (calc) 0 - 99 mg/dL 201   HDL-C >40 mg/dL 57   Trlycerides <150 mg/dL 126   Hemoglobin A1c 4.8 - 5.6 % 5.7      Exercise Target Goals: Exercise Program Goal: Individual exercise prescription set using results from initial 6 min walk test and THRR while considering  patient's activity barriers and safety.   Exercise Prescription Goal: Initial exercise prescription builds to 30-45 minutes a day of aerobic activity, 2-3 days per week.  Home exercise guidelines will be given to patient during program as part of exercise prescription that the participant will acknowledge.   Education: Aerobic  Exercise: - Group verbal and visual presentation on the components of exercise prescription. Introduces F.I.T.T principle from ACSM for exercise prescriptions.  Reviews F.I.T.T. principles of aerobic exercise including progression. Written material given at graduation.   Education: Resistance Exercise: - Group verbal and visual presentation on the components of exercise prescription. Introduces F.I.T.T principle from ACSM for exercise prescriptions  Reviews F.I.T.T. principles of resistance exercise including progression. Written material given at graduation.    Education: Exercise & Equipment Safety: - Individual verbal instruction and demonstration of equipment use and safety with use of the equipment. Flowsheet Row Cardiac Rehab from 06/21/2022 in [facility] Cardiac and Pulmonary Rehab  Date 05/22/22  Educator [facility]  Instruction Review Code 1- Verbalizes Understanding       Education: Exercise Physiology & General Exercise Guidelines: - Group verbal and written instruction with models to review the exercise physiology of the cardiovascular system and associated critical values. Provides general exercise guidelines with specific guidelines to those with heart or lung disease.    Education: Flexibility, Balance, Mind/Body Relaxation: - Group verbal and visual presentation with interactive activity on the components of exercise prescription. Introduces F.I.T.T principle from ACSM for exercise prescriptions. Reviews F.I.T.T. principles of flexibility and balance exercise training including progression. Also discusses the mind body connection.  Reviews various relaxation techniques to help reduce and manage stress (i.e. Deep breathing, progressive muscle relaxation, and visualization). Balance handout provided to take home. Written material given at graduation. Flowsheet Row Cardiac Rehab from 06/21/2022 in [facility] Cardiac and Pulmonary Rehab  Date 05/31/22  Educator [facility]  Instruction Review Code 1-  Verbalizes Understanding       Activity Barriers & Risk Stratification:  Activity Barriers & Cardiac Risk Stratification - 05/24/22 1532       Activity Barriers & Cardiac Risk Stratification   Activity Barriers Neck/Spine Problems   Scoliosis, Metal Plate in R leg   Cardiac Risk Stratification High             6 Minute Walk:  6 Minute Walk     Row Name 05/24/22 1530         6 Minute Walk   Phase Initial     Distance 975 feet     Walk Time 6 minutes     # of Rest Breaks 0     MPH 1.85     METS 1.92     RPE 12     Perceived Dyspnea  0     VO2 Peak 6.71     Symptoms No     Resting HR 58 bpm     Resting BP 114/70     Resting Oxygen Saturation  98 %     Exercise Oxygen Saturation  during 6 min  walk 96 %     Max Ex. HR 97 bpm     Max Ex. BP 140/82     2 Minute Post BP 116/78              Oxygen Initial Assessment:   Oxygen Re-Evaluation:   Oxygen Discharge (Final Oxygen Re-Evaluation):   Initial Exercise Prescription:  Initial Exercise Prescription - 05/24/22 1500       Date of Initial Exercise RX and Referring Provider   Date 05/24/22    Referring Provider Dr. Kathlyn [place]      Oxygen   Maintain Oxygen Saturation 88% or higher      NuStep   Level 2    SPM 80    Minutes 15    METs 1.92      Biostep-RELP   Level 1    SPM 50    Minutes 15    METs 1.92      Track   Laps 22    Minutes 15    METs 2.2      Prescription Details   Frequency (times per week) 2    Duration Progress to 30 minutes of continuous aerobic without signs/symptoms of physical distress      Intensity   THRR 40-80% of Max Heartrate 92-126    Ratings of Perceived Exertion 11-13    Perceived Dyspnea 0-4      Progression   Progression Continue to progress workloads to maintain intensity without signs/symptoms of physical distress.      Resistance Training   Training Prescription Yes    Weight 2 lb    Reps 10-15             Perform Capillary Blood  Glucose checks as needed.  Exercise Prescription Changes:   Exercise Prescription Changes     Row Name 05/24/22 1500 06/07/22 1500           Response to Exercise   Blood Pressure (Admit) 114/70 104/58      Blood Pressure (Exercise) 140/82 124/80      Blood Pressure (Exit) 116/78 104/66      Heart Rate (Admit) 58 bpm 77 bpm      Heart Rate (Exercise) 97 bpm 107 bpm      Heart Rate (Exit) 56 bpm 86 bpm      Oxygen Saturation (Admit) 98 % --      Oxygen Saturation (Exercise) 96 % --      Rating of Perceived Exertion (Exercise) 12 13      Perceived Dyspnea (Exercise) 0 --      Symptoms None --      Comments 6MWT Results First two days of exercise      Duration -- Progress to 30 minutes of  aerobic without signs/symptoms of physical distress      Intensity -- THRR unchanged        Progression   Progression -- Continue to progress workloads to maintain intensity without signs/symptoms of physical distress.      Average METs -- 1.93        Resistance Training   Training Prescription -- Yes      Weight -- 2 lb      Reps -- 10-15        Interval Training   Interval Training -- No        NuStep   Level -- 2      Minutes -- 15      METs -- 2.1  Biostep-RELP   Level -- 1      Minutes -- 15      METs -- 2        Track   Laps -- 13      Minutes -- 15      METs -- 1.71        Oxygen   Maintain Oxygen Saturation -- 88% or higher               Exercise Comments:   Exercise Comments     Row Name 05/29/22 1132           Exercise Comments First full day of exercise!  Patient was oriented to gym and equipment including functions, settings, policies, and procedures.  Patient's individual exercise prescription and treatment plan were reviewed.  All starting workloads were established based on the results of the 6 minute walk test done at initial orientation visit.  The plan for exercise progression was also introduced and progression will be customized based on  patient's performance and goals.                Exercise Goals and Review:   Exercise Goals     Row Name 05/24/22 1533             Exercise Goals   Increase Physical Activity Yes       Intervention Provide advice, education, support and counseling about physical activity/exercise needs.;Develop an individualized exercise prescription for aerobic and resistive training based on initial evaluation findings, risk stratification, comorbidities and participant's personal goals.       Expected Outcomes Short Term: Attend rehab on a regular basis to increase amount of physical activity.;Long Term: Add in home exercise to make exercise part of routine and to increase amount of physical activity.;Long Term: Exercising regularly at least 3-5 days a week.       Increase Strength and Stamina Yes       Intervention Provide advice, education, support and counseling about physical activity/exercise needs.;Develop an individualized exercise prescription for aerobic and resistive training based on initial evaluation findings, risk stratification, comorbidities and participant's personal goals.       Expected Outcomes Short Term: Increase workloads from initial exercise prescription for resistance, speed, and METs.;Short Term: Perform resistance training exercises routinely during rehab and add in resistance training at home;Long Term: Improve cardiorespiratory fitness, muscular endurance and strength as measured by increased METs and functional capacity (6MWT)       Able to understand and use rate of perceived exertion (RPE) scale Yes       Intervention Provide education and explanation on how to use RPE scale       Expected Outcomes Short Term: Able to use RPE daily in rehab to express subjective intensity level;Long Term:  Able to use RPE to guide intensity level when exercising independently       Able to understand and use Dyspnea scale Yes       Intervention Provide education and explanation on how  to use Dyspnea scale       Expected Outcomes Short Term: Able to use Dyspnea scale daily in rehab to express subjective sense of shortness of breath during exertion;Long Term: Able to use Dyspnea scale to guide intensity level when exercising independently       Knowledge and understanding of Target Heart Rate Range (THRR) Yes       Intervention Provide education and explanation of THRR including how the numbers were predicted and where they  are located for reference       Expected Outcomes Long Term: Able to use THRR to govern intensity when exercising independently;Short Term: Able to use daily as guideline for intensity in rehab;Short Term: Able to state/look up THRR       Able to check pulse independently Yes       Intervention Provide education and demonstration on how to check pulse in carotid and radial arteries.;Review the importance of being able to check your own pulse for safety during independent exercise       Expected Outcomes Short Term: Able to explain why pulse checking is important during independent exercise;Long Term: Able to check pulse independently and accurately       Understanding of Exercise Prescription Yes       Intervention Provide education, explanation, and written materials on patient's individual exercise prescription       Expected Outcomes Short Term: Able to explain program exercise prescription;Long Term: Able to explain home exercise prescription to exercise independently                Exercise Goals Re-Evaluation :  Exercise Goals Re-Evaluation     Row Name 05/29/22 1132 06/07/22 1523           Exercise Goal Re-Evaluation   Exercise Goals Review Increase Strength and Stamina;Able to understand and use rate of perceived exertion (RPE) scale;Able to understand and use Dyspnea scale;Understanding of Exercise Prescription Increase Strength and Stamina;Understanding of Exercise Prescription;Increase Physical Activity      Comments Reviewed RPE scale, THR  and program prescription with pt today.  Pt voiced understanding and was given a copy of goals to take home. Montasia is off to a good start in the program. She had an average MET level of 1.93 METs during her first two sessions of exercise. She was also able to walk up to 13 laps in the track. She was also able to tolerate level 2 on the T4 Nustep and level 1 on the Biostep. We will continue to monitor his progress in the program.      Expected Outcomes Short: Use RPE daily to regulate intensity. Long: Follow program prescription in THR. Short: Continue to follow current exercise prescription. Long: Continue to attend rehab.               Discharge Exercise Prescription (Final Exercise Prescription Changes):  Exercise Prescription Changes - 06/07/22 1500       Response to Exercise   Blood Pressure (Admit) 104/58    Blood Pressure (Exercise) 124/80    Blood Pressure (Exit) 104/66    Heart Rate (Admit) 77 bpm    Heart Rate (Exercise) 107 bpm    Heart Rate (Exit) 86 bpm    Rating of Perceived Exertion (Exercise) 13    Comments First two days of exercise    Duration Progress to 30 minutes of  aerobic without signs/symptoms of physical distress    Intensity THRR unchanged      Progression   Progression Continue to progress workloads to maintain intensity without signs/symptoms of physical distress.    Average METs 1.93      Resistance Training   Training Prescription Yes    Weight 2 lb    Reps 10-15      Interval Training   Interval Training No      NuStep   Level 2    Minutes 15    METs 2.1      Biostep-RELP   Level 1  Minutes 15    METs 2      Track   Laps 13    Minutes 15    METs 1.71      Oxygen   Maintain Oxygen Saturation 88% or higher             Nutrition:  Target Goals: Understanding of nutrition guidelines, daily intake of sodium <1544m, cholesterol <204m calories 30% from fat and 7% or less from saturated fats, daily to have 5 or more servings  of fruits and vegetables.  Education: All About Nutrition: -Group instruction provided by verbal, written material, interactive activities, discussions, models, and posters to present general guidelines for heart healthy nutrition including fat, fiber, MyPlate, the role of sodium in heart healthy nutrition, utilization of the nutrition label, and utilization of this knowledge for meal planning. Follow up email sent as well. Written material given at graduation. [facility]  Education need identified 05/24/22       Biometrics:  Pre Biometrics - 05/24/22 1534       Pre Biometrics   Height 5' 3"$  (1.6 m)    Weight 154 lb 3.2 oz (69.9 kg)    Waist Circumference 41 inches    Hip Circumference 41 inches    Waist to Hip Ratio 1 %    BMI (Calculated) 27.32    Single Leg Stand 30 seconds   L             Nutrition Therapy Plan and Nutrition Goals:  Nutrition Therapy & Goals - 05/24/22 1525       Intervention Plan   Intervention Prescribe, educate and counsel regarding individualized specific dietary modifications aiming towards targeted core components such as weight, hypertension, lipid management, diabetes, heart failure and other comorbidities.    Expected Outcomes Short Term Goal: Understand basic principles of dietary content, such as calories, fat, sodium, cholesterol and nutrients.;Short Term Goal: A plan has been developed with personal nutrition goals set during dietitian appointment.;Long Term Goal: Adherence to prescribed nutrition plan.             Nutrition Assessments:  MEDIFICTS Score Key: ?70 Need to make dietary changes  40-70 Heart Healthy Diet ? 40 Therapeutic Level Cholesterol Diet  [facility]  Picture Your Plate Total Score on Admission 62      Picture Your Plate Scores: <[id number] dietary pattern with much room  for improvement. 41-50 Dietary pattern unlikely to meet recommendations for good health and room for improvement. 51-60 More healthful dietary pattern, with some room for improvement.  >60 Healthy dietary pattern, although there may be some specific behaviors that could be improved.    Nutrition Goals Re-Evaluation:  Nutrition Goals Re-Evaluation     [place] 06/12/22 1111             Goals   Current Weight 151 lb (68.5 kg)       Nutrition Goal Meet with the dietitian.       Comment MaJazyaould like to meet with the dietitian       Expected Outcome Short: meet with dietitian. Long: adhere to a diet that pertains to her.                Nutrition Goals Discharge (Final Nutrition Goals Re-Evaluation):  Nutrition Goals Re-Evaluation - 06/12/22 1111       Goals   Current Weight 151 lb (  68.5 kg)    Nutrition Goal Meet with the dietitian.    Comment Samyukta would like to meet with the dietitian    Expected Outcome Short: meet with dietitian. Long: adhere to a diet that pertains to her.             Psychosocial: Target Goals: Acknowledge presence or absence of significant depression and/or stress, maximize coping skills, provide positive support system. Participant is able to verbalize types and ability to use techniques and skills needed for reducing stress and depression.   Education: Stress, Anxiety, and Depression - Group verbal and visual presentation to define topics covered.  Reviews how body is impacted by stress, anxiety, and depression.  Also discusses healthy ways to reduce stress and to treat/manage anxiety and depression.  Written material given at graduation.   Education: Sleep Hygiene -Provides group verbal and written instruction about how sleep can affect your health.  Define sleep hygiene, discuss sleep cycles and impact of sleep habits. Review good sleep hygiene tips.    Initial Review & Psychosocial Screening:  Initial Psych Review & Screening -  05/22/22 1119       Initial Review   Current issues with None Identified      Family Dynamics   Good Support System? Yes    Comments She can look to her daughter and gradaughter for support. She has the typical days where she can feel depressed and stressed but in a "normal".      Barriers   Psychosocial barriers to participate in program There are no identifiable barriers or psychosocial needs.      Screening Interventions   Interventions Encouraged to exercise;To provide support and resources with identified psychosocial needs;Provide feedback about the scores to participant    Expected Outcomes Short Term goal: Utilizing psychosocial counselor, staff and physician to assist with identification of specific Stressors or current issues interfering with healing process. Setting desired goal for each stressor or current issue identified.;Long Term Goal: Stressors or current issues are controlled or eliminated.;Long Term goal: The participant improves quality of Life and PHQ9 Scores as seen by post scores and/or verbalization of changes;Short Term goal: Identification and review with participant of any Quality of Life or Depression concerns found by scoring the questionnaire.             Quality of Life Scores:   Quality of Life - 05/24/22 1524       Quality of Life   Select Quality of Life      Quality of Life Scores   Health/Function Pre 23.11 %    Socioeconomic Pre 30 %    Psych/Spiritual Pre 29.14 %    Family Pre 18 %    GLOBAL Pre 25.22 %            Scores of 19 and below usually indicate a poorer quality of life in these areas.  A difference of  2-3 points is a clinically meaningful difference.  A difference of 2-3 points in the total score of the Quality of Life Index has been associated with significant improvement in overall quality of life, self-image, physical symptoms, and general health in studies assessing change in quality of life.  PHQ-9: Review Flowsheet        05/24/2022  Depression screen PHQ 2/9  Decreased Interest 0  Down, Depressed, Hopeless 0  PHQ - 2 Score 0  Altered sleeping 0  Tired, decreased energy 0  Change in appetite 1  Feeling bad or failure about  yourself  0  Trouble concentrating 0  Moving slowly or fidgety/restless 0  Suicidal thoughts 0  PHQ-9 Score 1  Difficult doing work/chores Not difficult at all   Interpretation of Total Score  Total Score Depression Severity:  1-4 = Minimal depression, 5-9 = Mild depression, 10-14 = Moderate depression, 15-19 = Moderately severe depression, 20-27 = Severe depression   Psychosocial Evaluation and Intervention:  Psychosocial Evaluation - 05/22/22 1121       Psychosocial Evaluation & Interventions   Interventions Encouraged to exercise with the program and follow exercise prescription;Relaxation education;Stress management education    Comments She can look to her daughter and gradaughter for support. She has the typical days where she can feel depressed and stressed but in a "normal".    Expected Outcomes Short: Start HeartTrack to help with mood. Long: Maintain a healthy mental state    Continue Psychosocial Services  Follow up required by staff             Psychosocial Re-Evaluation:  Psychosocial Re-Evaluation     [place] Name 06/12/22 1114             Psychosocial Re-Evaluation   Current issues with None Identified       Comments Patient reports no issues with their current mental states, sleep, stress, depression or anxiety. Will follow up with patient in a few weeks for any changes.       Expected Outcomes Short: Continue to exercise regularly to support mental health and notify staff of any changes. Long: maintain mental health and well being through teaching of rehab or prescribed medications independently.       Interventions Encouraged to attend Cardiac Rehabilitation for the exercise       Continue Psychosocial Services  Follow up required by staff                 Psychosocial Discharge (Final Psychosocial Re-Evaluation):  Psychosocial Re-Evaluation - 06/12/22 1114       Psychosocial Re-Evaluation   Current issues with None Identified    Comments Patient reports no issues with their current mental states, sleep, stress, depression or anxiety. Will follow up with patient in a few weeks for any changes.    Expected Outcomes Short: Continue to exercise regularly to support mental health and notify staff of any changes. Long: maintain mental health and well being through teaching of rehab or prescribed medications independently.    Interventions Encouraged to attend Cardiac Rehabilitation for the exercise    Continue Psychosocial Services  Follow up required by staff             Vocational Rehabilitation: Provide vocational rehab assistance to qualifying candidates.   Vocational Rehab Evaluation & Intervention:   Education: Education Goals: Education classes will be provided on a variety of topics geared toward better understanding of heart health and risk factor modification. Participant will state understanding/return demonstration of topics presented as noted by education test scores.  Learning Barriers/Preferences:  Learning Barriers/Preferences - 05/22/22 1112       Learning Barriers/Preferences   Learning Barriers None    Learning Preferences None             General Cardiac Education Topics:  AED/CPR: - Group verbal and written instruction with the use of models to demonstrate the basic use of the AED with the basic ABC's of resuscitation.   Anatomy and Cardiac Procedures: - Group verbal and visual presentation and models provide information about basic cardiac anatomy and function. Reviews  the testing methods done to diagnose heart disease and the outcomes of the test results. Describes the treatment choices: Medical Management, Angioplasty, or Coronary Bypass Surgery for treating various heart conditions  including Myocardial Infarction, Angina, Valve Disease, and Cardiac Arrhythmias.  Written material given at graduation. Flowsheet Row Cardiac Rehab from 06/21/2022 in [facility] Cardiac and Pulmonary Rehab  Education need identified 05/24/22  Date 06/07/22  Educator SB  Instruction Review Code 1- Verbalizes Understanding       Medication Safety: - Group verbal and visual instruction to review commonly prescribed medications for heart and lung disease. Reviews the medication, class of the drug, and side effects. Includes the steps to properly store meds and maintain the prescription regimen.  Written material given at graduation. Flowsheet Row Cardiac Rehab from 06/21/2022 in [facility] Cardiac and Pulmonary Rehab  Date 06/21/22  Educator MS  Instruction Review Code 1- Verbalizes Understanding       Intimacy: - Group verbal instruction through game format to discuss how heart and lung disease can affect sexual intimacy. Written material given at graduation..   Know Your Numbers and Heart Failure: - Group verbal and visual instruction to discuss disease risk factors for cardiac and pulmonary disease and treatment options.  Reviews associated critical values for Overweight/Obesity, Hypertension, Cholesterol, and Diabetes.  Discusses basics of heart failure: signs/symptoms and treatments.  Introduces Heart Failure Zone chart for action plan for heart failure.  Written material given at graduation.   Infection Prevention: - Provides verbal and written material to individual with discussion of infection control including proper hand washing and proper equipment cleaning during exercise session. Flowsheet Row Cardiac Rehab from 06/21/2022 in [facility] Cardiac and Pulmonary Rehab  Date 05/22/22  Educator [facility]  Instruction Review Code 1- Verbalizes Understanding       Falls Prevention: - Provides verbal and written material to individual with discussion of falls prevention and safety. Flowsheet Row Cardiac  Rehab from 06/21/2022 in [facility] Cardiac and Pulmonary Rehab  Date 05/22/22  Educator [facility]  Instruction Review Code 1- Verbalizes Understanding       Other: -Provides group and verbal instruction on various topics (see comments) Flowsheet Row Cardiac Rehab from 06/21/2022 in [facility] Cardiac and Pulmonary Rehab  Date 06/14/22  Educator SB  Instruction Review Code 1- Verbalizes Understanding  [Jeopardy]       Knowledge Questionnaire Score:  Knowledge Questionnaire Score - 05/24/22 1519       Knowledge Questionnaire Score   Pre Score 22/26             Core Components/Risk Factors/Patient Goals at Admission:  Personal Goals and Risk Factors at Admission - 05/24/22 1520       Core Components/Risk Factors/Patient Goals on Admission    Weight Management Yes;Weight Maintenance    Intervention Weight Management: Develop a combined nutrition and exercise program designed to reach desired caloric intake, while maintaining appropriate intake of nutrient and fiber, sodium and fats, and appropriate energy expenditure required for the weight goal.;Weight Management: Provide education and appropriate resources to help participant work on and attain dietary goals.;Weight Management/Obesity: Establish reasonable short term and long term weight goals.    Admit Weight 154 lb 3.2 oz (69.9 kg)    Goal Weight: Short Term 150 lb (68 kg)    Goal Weight: Long Term 145 lb (65.8 kg)    Expected Outcomes Short Term: Continue to assess and modify interventions until short term weight is achieved;Long Term: Adherence to nutrition and physical activity/exercise program aimed toward attainment  of established weight goal;Weight Maintenance: Understanding of the daily nutrition guidelines, which includes 25-35% calories from fat, 7% or less cal from saturated fats, less than 264m cholesterol, less than 1.5gm of sodium, & 5 or more servings of fruits and vegetables daily;Understanding recommendations for meals to  include 15-35% energy as protein, 25-35% energy from fat, 35-60% energy from carbohydrates, less than 2048mof dietary cholesterol, 20-35 gm of total fiber daily;Understanding of distribution of calorie intake throughout the day with the consumption of 4-5 meals/snacks    Hypertension Yes    Intervention Provide education on lifestyle modifcations including regular physical activity/exercise, weight management, moderate sodium restriction and increased consumption of fresh fruit, vegetables, and low fat dairy, alcohol moderation, and smoking cessation.;Monitor prescription use compliance.    Expected Outcomes Short Term: Continued assessment and intervention until BP is < 140/9075mG in hypertensive participants. < 130/41m18m in hypertensive participants with diabetes, heart failure or chronic kidney disease.;Long Term: Maintenance of blood pressure at goal levels.    Lipids Yes    Intervention Provide education and support for participant on nutrition & aerobic/resistive exercise along with prescribed medications to achieve LDL <70mg20mL >40mg.31mExpected Outcomes Short Term: Participant states understanding of desired cholesterol values and is compliant with medications prescribed. Participant is following exercise prescription and nutrition guidelines.;Long Term: Cholesterol controlled with medications as prescribed, with individualized exercise RX and with personalized nutrition plan. Value goals: LDL < 70mg, 92m> 40 mg.             Education:Diabetes - Individual verbal and written instruction to review signs/symptoms of diabetes, desired ranges of glucose level fasting, after meals and with exercise. Acknowledge that pre and post exercise glucose checks will be done for 3 sessions at entry of program.   Core Components/Risk Factors/Patient Goals Review:   Goals and Risk Factor Review     Row Name 06/12/22 1115             Core Components/Risk Factors/Patient Goals Review    Personal Goals Review Weight Management/Obesity;Hypertension       Review Jubilee Dalissa that she does not check her blood pressure at home. She is doing well since starting the program with her weight and blood pressure. Her blood pressure was 124/62 today. She has no questions about her medications at this time.       Expected Outcomes Short: maintain weight.. Long: maintain blood pressure and weight independently.                Core Components/Risk Factors/Patient Goals at Discharge (Final Review):   Goals and Risk Factor Review - 06/12/22 1115       Core Components/Risk Factors/Patient Goals Review   Personal Goals Review Weight Management/Obesity;Hypertension    Review Jadene Sigal that she does not check her blood pressure at home. She is doing well since starting the program with her weight and blood pressure. Her blood pressure was 124/62 today. She has no questions about her medications at this time.    Expected Outcomes Short: maintain weight.. Long: maintain blood pressure and weight independently.             ITP Comments:  ITP Comments     Row Name 05/22/22 1111 05/24/22 1516 05/29/22 1132 06/21/22 1232     ITP Comments Virtual Visit completed. Patient informed on EP and RD appointment and 6 Minute walk test. Patient also informed of patient health questionnaires on My Chart. Patient Verbalizes understanding. Visit diagnosis  can be found in [facility] 04/03/2022. Completed 6MWT and gym orientation. Initial ITP created and sent for review to Dr. Emily Filbert, Medical Director. First full day of exercise!  Patient was oriented to gym and equipment including functions, settings, policies, and procedures.  Patient's individual exercise prescription and treatment plan were reviewed.  All starting workloads were established based on the results of the 6 minute walk test done at initial orientation visit.  The plan for exercise progression was also introduced and progression will be  customized based on patient's performance and goals. 30 day review completed. ITP sent to Dr. Emily Filbert, Medical Director of Cardiac Rehab. Continue with ITP unless changes are made by physician.             Comments: 30 day review

## 2022-06-21 NOTE — Progress Notes (Signed)
Daily Session Note  Patient Details  Name: Lisa Hale MRN: [id number] Date of Birth: [birth year] Referring Provider:   Flowsheet Row Cardiac Rehab from 05/24/2022 in [facility] Cardiac and Pulmonary Rehab  Referring Provider Dr. Kathlyn Sacramento       Encounter Date: 06/21/2022  Check In:  Session Check In - 06/21/22 1039       Check-In   Supervising physician immediately available to respond to emergencies See telemetry face sheet for immediately available ER MD    Location [facility]-Cardiac & Pulmonary Rehab    Staff Present Renita Papa, RN BSN;Megan Tamala Julian, RN, ADN;Joseph Hood, RCP,RRT,BSRT;Noah Tickle, BS, Exercise Physiologist    Virtual Visit No    Medication changes reported     No    Fall or balance concerns reported    No    Tobacco Cessation No Change    Current number of cigarettes/nicotine per day     0    Warm-up and Cool-down Performed on first and last piece of equipment    Resistance Training Performed Yes    VAD Patient? No    PAD/SET Patient? No      Pain Assessment   Currently in Pain? No/denies                Social History   Tobacco Use  Smoking Status Former   Packs/day: 0.25   Years: 15.00   Total pack years: 3.75   Types: Cigarettes   Quit date: 10/29/2021   Years since quitting: 0.6  Smokeless Tobacco Never  Tobacco Comments   She has been on and off smoking since she was 77 years old.    Goals Met:  Independence with exercise equipment Exercise tolerated well No report of concerns or symptoms today Strength training completed today  Goals Unmet:  Not Applicable  Comments: Pt able to follow exercise prescription today without complaint.  Will continue to monitor for progression.    Dr. Emily Filbert is Medical Director for [place].  Dr. Ottie Glazier is Medical Director for [facility] Pulmonary Rehabilitation.

## 2022-06-23 ENCOUNTER — Other Ambulatory Visit (HOSPITAL_COMMUNITY): Payer: Self-pay

## 2022-06-23 DIAGNOSIS — R42 Dizziness and giddiness: Secondary | ICD-10-CM

## 2022-06-24 ENCOUNTER — Other Ambulatory Visit (HOSPITAL_COMMUNITY): Payer: Self-pay

## 2022-06-26 ENCOUNTER — Encounter: Payer: Medicare Other | Admitting: *Deleted

## 2022-06-26 ENCOUNTER — Other Ambulatory Visit: Payer: Self-pay | Admitting: *Deleted

## 2022-06-26 DIAGNOSIS — Z955 Presence of coronary angioplasty implant and graft: Secondary | ICD-10-CM

## 2022-06-26 DIAGNOSIS — I213 ST elevation (STEMI) myocardial infarction of unspecified site: Secondary | ICD-10-CM

## 2022-06-26 MED ORDER — REPATHA SURECLICK 140 MG/ML ~~LOC~~ SOAJ: 140.0000 mg | SUBCUTANEOUS | 11 refills | Status: DC

## 2022-06-26 NOTE — Progress Notes (Signed)
Daily Session Note  Patient Details  Name: Lisa Hale MRN: [id number] Date of Birth: 10-Jun-1945 Referring Provider:   Flowsheet Row Cardiac Rehab from 05/24/2022 in [facility] Cardiac and Pulmonary Rehab  Referring Provider Dr. Kathlyn Sacramento       Encounter Date: 06/26/2022  Check In:  Session Check In - 06/26/22 1116       Check-In   Supervising physician immediately available to respond to emergencies See telemetry face sheet for immediately available ER MD    Location [facility]-Cardiac & Pulmonary Rehab    Staff Present Darlyne Russian, RN, Doyce Para, BS, ACSM CEP, Exercise Physiologist;Kara Maricela Bo, MS, ACSM CEP, Exercise Physiologist;Noah Tickle, BS, Exercise Physiologist    Virtual Visit No    Medication changes reported     No    Fall or balance concerns reported    No    Tobacco Cessation No Change    Warm-up and Cool-down Performed on first and last piece of equipment    Resistance Training Performed Yes    VAD Patient? No    PAD/SET Patient? No      Pain Assessment   Currently in Pain? No/denies                Social History   Tobacco Use  Smoking Status Former   Packs/day: 0.25   Years: 15.00   Total pack years: 3.75   Types: Cigarettes   Quit date: 10/29/2021   Years since quitting: 0.6  Smokeless Tobacco Never  Tobacco Comments   She has been on and off smoking since she was 77 years old.    Goals Met:  Independence with exercise equipment Exercise tolerated well No report of concerns or symptoms today Strength training completed today  Goals Unmet:  Not Applicable  Comments: Pt able to follow exercise prescription today without complaint.  Will continue to monitor for progression.    Dr. Emily Filbert is Medical Director for [place].  Dr. Ottie Glazier is Medical Director for [facility] Pulmonary Rehabilitation.

## 2022-06-28 ENCOUNTER — Encounter: Payer: Medicare Other | Admitting: *Deleted

## 2022-06-28 ENCOUNTER — Other Ambulatory Visit (HOSPITAL_COMMUNITY): Payer: Self-pay

## 2022-06-28 DIAGNOSIS — I213 ST elevation (STEMI) myocardial infarction of unspecified site: Secondary | ICD-10-CM

## 2022-06-28 DIAGNOSIS — Z955 Presence of coronary angioplasty implant and graft: Secondary | ICD-10-CM

## 2022-06-28 NOTE — Progress Notes (Signed)
Daily Session Note  Patient Details  Name: Lisa Hale MRN: [id number] Date of Birth: November 08, 1945 Referring Provider:   Flowsheet Row Cardiac Rehab from 05/24/2022 in [facility] Cardiac and Pulmonary Rehab  Referring Provider Dr. Kathlyn Sacramento       Encounter Date: 06/28/2022  Check In:  Session Check In - 06/28/22 1118       Check-In   Supervising physician immediately available to respond to emergencies See telemetry face sheet for immediately available ER MD    Location [facility]-Cardiac & Pulmonary Rehab    Staff Present Darlyne Russian, RN, ADN;Laureen Owens Shark, BS, RRT, CPFT;Joseph [place], RCP,RRT,BSRT;Noah Tickle, BS, Exercise Physiologist    Virtual Visit No    Medication changes reported     No    Fall or balance concerns reported    No    Tobacco Cessation No Change    Warm-up and Cool-down Performed on first and last piece of equipment    Resistance Training Performed Yes    VAD Patient? No    PAD/SET Patient? No      Pain Assessment   Currently in Pain? No/denies                Social History   Tobacco Use  Smoking Status Former   Packs/day: 0.25   Years: 15.00   Total pack years: 3.75   Types: Cigarettes   Quit date: 10/29/2021   Years since quitting: 0.6  Smokeless Tobacco Never  Tobacco Comments   She has been on and off smoking since she was 77 years old.    Goals Met:  Independence with exercise equipment Exercise tolerated well No report of concerns or symptoms today Strength training completed today  Goals Unmet:  Not Applicable  Comments: Pt able to follow exercise prescription today without complaint.  Will continue to monitor for progression.    Dr. Emily Filbert is Medical Director for [place].  Dr. Ottie Glazier is Medical Director for [facility] Pulmonary Rehabilitation.

## 2022-06-29 ENCOUNTER — Telehealth: Payer: Self-pay

## 2022-06-29 ENCOUNTER — Other Ambulatory Visit (HOSPITAL_COMMUNITY): Payer: Self-pay

## 2022-06-29 ENCOUNTER — Telehealth: Payer: Self-pay | Admitting: Physician Assistant

## 2022-06-29 NOTE — Telephone Encounter (Signed)
Pharmacy Patient Advocate Encounter   Received notification from [place] that prior authorization for REPATHA 140 MG/ML INJ is needed.    PA submitted on 06/29/22 Key [id number] Status is pending  Karie Soda, [place] Patient Advocate Specialist Direct Number: [phone] Fax: [phone]

## 2022-06-29 NOTE — Telephone Encounter (Signed)
[facility] called for a prior auth for [place],  [id number] # [id number] Store # [id number].

## 2022-06-30 NOTE — Telephone Encounter (Signed)
Pharmacy Patient Advocate Encounter  Prior Authorization for REPATHA 140 MG/ML INJ has been approved.    PA# [id number] Effective dates: 06/29/22 through 12/28/22  Karie Soda, [place] Patient Advocate Specialist Direct Number: [phone] Fax: [phone]

## 2022-07-03 ENCOUNTER — Encounter: Payer: Medicare Other | Attending: Cardiovascular Disease | Admitting: *Deleted

## 2022-07-03 DIAGNOSIS — I213 ST elevation (STEMI) myocardial infarction of unspecified site: Secondary | ICD-10-CM | POA: Diagnosis present

## 2022-07-03 DIAGNOSIS — Z955 Presence of coronary angioplasty implant and graft: Secondary | ICD-10-CM | POA: Insufficient documentation

## 2022-07-03 DIAGNOSIS — I252 Old myocardial infarction: Secondary | ICD-10-CM | POA: Insufficient documentation

## 2022-07-03 DIAGNOSIS — Z48812 Encounter for surgical aftercare following surgery on the circulatory system: Secondary | ICD-10-CM | POA: Insufficient documentation

## 2022-07-03 NOTE — Progress Notes (Signed)
Daily Session Note  Patient Details  Name: Lisa Hale MRN: [id number] Date of Birth: [birth year] Referring Provider:   Flowsheet Row Cardiac Rehab from 05/24/2022 in [facility] Cardiac and Pulmonary Rehab  Referring Provider Dr. Kathlyn Sacramento       Encounter Date: 07/03/2022  Check In:  Session Check In - 07/03/22 1117       Check-In   Supervising physician immediately available to respond to emergencies See telemetry face sheet for immediately available ER MD    Location [facility]-Cardiac & Pulmonary Rehab    Staff Present Darlyne Russian, RN, Doyce Para, BS, ACSM CEP, Exercise Physiologist;Noah Tickle, BS, Exercise Physiologist    Virtual Visit No    Medication changes reported     No    Fall or balance concerns reported    No    Tobacco Cessation No Change    Warm-up and Cool-down Performed on first and last piece of equipment    Resistance Training Performed Yes    VAD Patient? No    PAD/SET Patient? No      Pain Assessment   Currently in Pain? No/denies                Social History   Tobacco Use  Smoking Status Former   Packs/day: 0.25   Years: 15.00   Total pack years: 3.75   Types: Cigarettes   Quit date: 10/29/2021   Years since quitting: 0.6  Smokeless Tobacco Never  Tobacco Comments   She has been on and off smoking since she was 77 years old.    Goals Met:  Independence with exercise equipment Exercise tolerated well No report of concerns or symptoms today Strength training completed today  Goals Unmet:  Not Applicable  Comments: Pt able to follow exercise prescription today without complaint.  Will continue to monitor for progression.    Dr. Emily Filbert is Medical Director for [place].  Dr. Ottie Glazier is Medical Director for [facility] Pulmonary Rehabilitation.

## 2022-07-05 ENCOUNTER — Encounter: Payer: Medicare Other | Admitting: *Deleted

## 2022-07-10 ENCOUNTER — Encounter: Payer: Medicare Other | Admitting: *Deleted

## 2022-07-10 DIAGNOSIS — Z955 Presence of coronary angioplasty implant and graft: Secondary | ICD-10-CM | POA: Diagnosis not present

## 2022-07-10 DIAGNOSIS — I213 ST elevation (STEMI) myocardial infarction of unspecified site: Secondary | ICD-10-CM

## 2022-07-10 NOTE — Progress Notes (Signed)
Daily Session Note  Patient Details  Name: Lisa Hale MRN: [id number] Date of Birth: Jun 22, 1945 Referring Provider:   Flowsheet Row Cardiac Rehab from 05/24/2022 in [facility] Cardiac and Pulmonary Rehab  Referring Provider Dr. Kathlyn Sacramento       Encounter Date: 07/10/2022  Check In:  Session Check In - 07/10/22 1111       Check-In   Supervising physician immediately available to respond to emergencies See telemetry face sheet for immediately available ER MD    Location [facility]-Cardiac & Pulmonary Rehab    Staff Present Darlyne Russian, RN, Doyce Para, BS, ACSM CEP, Exercise Physiologist;Noah Tickle, BS, Exercise Physiologist;Meredith Sherryll Burger, RN BSN    Virtual Visit No    Medication changes reported     No    Fall or balance concerns reported    No    Tobacco Cessation No Change    Warm-up and Cool-down Performed on first and last piece of equipment    Resistance Training Performed Yes    VAD Patient? No    PAD/SET Patient? No      Pain Assessment   Currently in Pain? No/denies                Social History   Tobacco Use  Smoking Status Former   Packs/day: 0.25   Years: 15.00   Total pack years: 3.75   Types: Cigarettes   Quit date: 10/29/2021   Years since quitting: 0.6  Smokeless Tobacco Never  Tobacco Comments   She has been on and off smoking since she was 77 years old.    Goals Met:  Independence with exercise equipment Exercise tolerated well Personal goals reviewed No report of concerns or symptoms today Strength training completed today  Goals Unmet:  Not Applicable  Comments: Pt able to follow exercise prescription today without complaint.  Will continue to monitor for progression.    Dr. Emily Filbert is Medical Director for [place].  Dr. Ottie Glazier is Medical Director for [facility] Pulmonary Rehabilitation.

## 2022-07-12 ENCOUNTER — Encounter: Payer: Medicare Other | Admitting: *Deleted

## 2022-07-12 DIAGNOSIS — Z955 Presence of coronary angioplasty implant and graft: Secondary | ICD-10-CM

## 2022-07-12 DIAGNOSIS — I213 ST elevation (STEMI) myocardial infarction of unspecified site: Secondary | ICD-10-CM

## 2022-07-12 NOTE — Progress Notes (Signed)
Daily Session Note  Patient Details  Name: Lisa Hale MRN: [id number] Date of Birth: Apr 02, 1946 Referring Provider:   Flowsheet Row Cardiac Rehab from 05/24/2022 in [facility] Cardiac and Pulmonary Rehab  Referring Provider Dr. Kathlyn Sacramento       Encounter Date: 07/12/2022  Check In:  Session Check In - 07/12/22 1130       Check-In   Supervising physician immediately available to respond to emergencies See telemetry face sheet for immediately available ER MD    Location [facility]-Cardiac & Pulmonary Rehab    Staff Present Darlyne Russian, RN, ADN;Meredith Sherryll Burger, RN BSN;Joseph Hood, RCP,RRT,BSRT;Noah Tickle, BS, Exercise Physiologist    Virtual Visit No    Medication changes reported     No    Fall or balance concerns reported    No    Tobacco Cessation No Change    Warm-up and Cool-down Performed on first and last piece of equipment    Resistance Training Performed Yes    VAD Patient? No    PAD/SET Patient? No      Pain Assessment   Currently in Pain? No/denies                Social History   Tobacco Use  Smoking Status Former   Packs/day: 0.25   Years: 15.00   Total pack years: 3.75   Types: Cigarettes   Quit date: 10/29/2021   Years since quitting: 0.7  Smokeless Tobacco Never  Tobacco Comments   She has been on and off smoking since she was 77 years old.    Goals Met:  Independence with exercise equipment Exercise tolerated well No report of concerns or symptoms today Strength training completed today  Goals Unmet:  Not Applicable  Comments: Pt able to follow exercise prescription today without complaint.  Will continue to monitor for progression.    Dr. Emily Filbert is Medical Director for [place].  Dr. Ottie Glazier is Medical Director for [facility] Pulmonary Rehabilitation.

## 2022-07-13 ENCOUNTER — Other Ambulatory Visit: Payer: Self-pay | Admitting: *Deleted

## 2022-07-13 MED ORDER — TICAGRELOR 90 MG PO TABS: 90.0000 mg | ORAL_TABLET | Freq: Two times a day (BID) | ORAL | 11 refills | Status: DC

## 2022-07-17 ENCOUNTER — Encounter: Payer: Medicare Other | Admitting: *Deleted

## 2022-07-17 DIAGNOSIS — Z955 Presence of coronary angioplasty implant and graft: Secondary | ICD-10-CM

## 2022-07-17 DIAGNOSIS — I213 ST elevation (STEMI) myocardial infarction of unspecified site: Secondary | ICD-10-CM

## 2022-07-17 NOTE — Progress Notes (Signed)
Daily Session Note  Patient Details  Name: Seqouia Catrambone MRN: [id number] Date of Birth: 1945/06/06 Referring Provider:   Flowsheet Row Cardiac Rehab from 05/24/2022 in [facility] Cardiac and Pulmonary Rehab  Referring Provider Dr. Kathlyn Sacramento       Encounter Date: 07/17/2022  Check In:  Session Check In - 07/17/22 1135       Check-In   Supervising physician immediately available to respond to emergencies See telemetry face sheet for immediately available ER MD    Location [facility]-Cardiac & Pulmonary Rehab    Staff Present Darlyne Russian, RN, Doyce Para, BS, ACSM CEP, Exercise Physiologist;Meredith Sherryll Burger, RN BSN;Noah Tickle, BS, Exercise Physiologist    Virtual Visit No    Medication changes reported     No    Fall or balance concerns reported    No    Tobacco Cessation No Change    Warm-up and Cool-down Performed on first and last piece of equipment    Resistance Training Performed Yes    VAD Patient? No    PAD/SET Patient? No      Pain Assessment   Currently in Pain? No/denies                Social History   Tobacco Use  Smoking Status Former   Packs/day: 0.25   Years: 15.00   Additional pack years: 0.00   Total pack years: 3.75   Types: Cigarettes   Quit date: 10/29/2021   Years since quitting: 0.7  Smokeless Tobacco Never  Tobacco Comments   She has been on and off smoking since she was 77 years old.    Goals Met:  Independence with exercise equipment Exercise tolerated well No report of concerns or symptoms today Strength training completed today  Goals Unmet:  Not Applicable  Comments: Pt able to follow exercise prescription today without complaint.  Will continue to monitor for progression.    Dr. Emily Filbert is Medical Director for [place].  Dr. Ottie Glazier is Medical Director for [facility] Pulmonary Rehabilitation.

## 2022-07-19 ENCOUNTER — Encounter: Payer: Medicare Other | Admitting: *Deleted

## 2022-07-19 ENCOUNTER — Encounter: Payer: Self-pay | Admitting: *Deleted

## 2022-07-19 DIAGNOSIS — Z955 Presence of coronary angioplasty implant and graft: Secondary | ICD-10-CM

## 2022-07-19 DIAGNOSIS — I213 ST elevation (STEMI) myocardial infarction of unspecified site: Secondary | ICD-10-CM

## 2022-07-19 NOTE — Progress Notes (Signed)
Cardiac Individual Treatment Plan  Patient Details  Name: Lisa Hale MRN: [id number] Date of Birth: 1945-08-29 Referring Provider:   Flowsheet Row Cardiac Rehab from 05/24/2022 in [facility] Cardiac and Pulmonary Rehab  Referring Provider Dr. Kathlyn Sacramento       Initial Encounter Date:  Flowsheet Row Cardiac Rehab from 05/24/2022 in [facility] Cardiac and Pulmonary Rehab  Date 05/24/22       Visit Diagnosis: ST elevation myocardial infarction (STEMI), unspecified artery [facility]  Status post coronary artery stent placement  Patient's Home Medications on Admission:  Current Outpatient Medications:    albuterol (PROVENTIL HFA;VENTOLIN HFA) 108 (90 Base) MCG/ACT inhaler, Inhale into the lungs every 6 (six) hours as needed for wheezing or shortness of breath., Disp: , Rfl:    aspirin EC 81 MG tablet, Take 1 tablet (81 mg total) by mouth daily. Swallow whole., Disp: 90 tablet, Rfl: 3   carvedilol (COREG) 3.125 MG tablet, Take 1 tablet (3.125 mg total) by mouth 2 (two) times daily with a meal., Disp: 180 tablet, Rfl: 0   cholecalciferol 25 MCG (1000 UT) TABS, Take by mouth daily., Disp: , Rfl:    dextromethorphan-guaiFENesin (ROBITUSSIN-DM) 10-100 MG/5ML liquid, Take 5 mLs by mouth every 4 (four) hours as needed for cough., Disp: , Rfl:    Evolocumab (REPATHA SURECLICK) [id number] MG/ML SOAJ, Inject 140 mg into the skin every 14 (fourteen) days., Disp: 2 mL, Rfl: 11   fluticasone (FLOVENT HFA) 220 MCG/ACT inhaler, Inhale into the lungs 2 (two) times daily as needed., Disp: , Rfl:    furosemide (LASIX) 20 MG tablet, Take 1 tablet (20 mg total) by mouth as needed (as needed for increased shortness of breath or weight gain greater than 3 pounds overnight)., Disp: 90 tablet, Rfl: 3   lansoprazole (PREVACID) 15 MG capsule, Take 15 mg by mouth daily. Take around 8- 9am., Disp: , Rfl:    levothyroxine (SYNTHROID) 100 MCG tablet, Take 100 mcg by mouth daily., Disp: , Rfl:    montelukast (SINGULAIR) 10 MG  tablet, Take 10 mg by mouth at bedtime., Disp: , Rfl:    nitroGLYCERIN (NITROSTAT) 0.4 MG SL tablet, Take 1 tablet under the tongue every 5 minutes for up to 3 doses as directed, Disp: 25 tablet, Rfl: 1   phenazopyridine (PYRIDIUM) 200 MG tablet, Take 200 mg by mouth 3 (three) times daily as needed., Disp: , Rfl:    sacubitril-valsartan (ENTRESTO) 24-26 MG, Take 1 tablet by mouth 2 (two) times daily., Disp: 60 tablet, Rfl: 1   spironolactone (ALDACTONE) 25 MG tablet, Take 0.5 tablets (12.5 mg total) by mouth daily., Disp: 45 tablet, Rfl: 3   ticagrelor (BRILINTA) 90 MG TABS tablet, Take 1 tablet (90 mg total) by mouth 2 (two) times daily., Disp: 60 tablet, Rfl: 11  Past Medical History: Past Medical History:  Diagnosis Date   Asthma    CHF (congestive heart failure) (HCC)    Coronary artery disease    GERD (gastroesophageal reflux disease)    Glaucoma    Hypertension    Hypothyroidism    Scoliosis    Seasonal allergies    Skin cancer    Vertigo    Avg: 1x/mo    Tobacco Use: Social History   Tobacco Use  Smoking Status Former   Packs/day: 0.25   Years: 15.00   Additional pack years: 0.00   Total pack years: 3.75   Types: Cigarettes   Quit date: 10/29/2021   Years since quitting: 0.7  Smokeless Tobacco Never  Tobacco Comments   She has been on and off smoking since she was 77 years old.    Labs: Review Flowsheet       Latest Ref Rng & Units 04/03/2022  Labs for ITP Cardiac and Pulmonary Rehab  Cholestrol 0 - 200 mg/dL 283   LDL (calc) 0 - 99 mg/dL 201   HDL-C >40 mg/dL 57   Trlycerides <150 mg/dL 126   Hemoglobin A1c 4.8 - 5.6 % 5.7      Exercise Target Goals: Exercise Program Goal: Individual exercise prescription set using results from initial 6 min walk test and THRR while considering  patient's activity barriers and safety.   Exercise Prescription Goal: Initial exercise prescription builds to 30-45 minutes a day of aerobic activity, 2-3 days per week.  Home  exercise guidelines will be given to patient during program as part of exercise prescription that the participant will acknowledge.   Education: Aerobic Exercise: - Group verbal and visual presentation on the components of exercise prescription. Introduces F.I.T.T principle from ACSM for exercise prescriptions.  Reviews F.I.T.T. principles of aerobic exercise including progression. Written material given at graduation.   Education: Resistance Exercise: - Group verbal and visual presentation on the components of exercise prescription. Introduces F.I.T.T principle from ACSM for exercise prescriptions  Reviews F.I.T.T. principles of resistance exercise including progression. Written material given at graduation.    Education: Exercise & Equipment Safety: - Individual verbal instruction and demonstration of equipment use and safety with use of the equipment. Flowsheet Row Cardiac Rehab from 07/12/2022 in [facility] Cardiac and Pulmonary Rehab  Date 05/22/22  Educator [facility]  Instruction Review Code 1- Verbalizes Understanding       Education: Exercise Physiology & General Exercise Guidelines: - Group verbal and written instruction with models to review the exercise physiology of the cardiovascular system and associated critical values. Provides general exercise guidelines with specific guidelines to those with heart or lung disease.    Education: Flexibility, Balance, Mind/Body Relaxation: - Group verbal and visual presentation with interactive activity on the components of exercise prescription. Introduces F.I.T.T principle from ACSM for exercise prescriptions. Reviews F.I.T.T. principles of flexibility and balance exercise training including progression. Also discusses the mind body connection.  Reviews various relaxation techniques to help reduce and manage stress (i.e. Deep breathing, progressive muscle relaxation, and visualization). Balance handout provided to take home. Written material given at  graduation. Flowsheet Row Cardiac Rehab from 07/12/2022 in [facility] Cardiac and Pulmonary Rehab  Date 05/31/22  Educator [facility]  Instruction Review Code 1- Verbalizes Understanding       Activity Barriers & Risk Stratification:  Activity Barriers & Cardiac Risk Stratification - 05/24/22 1532       Activity Barriers & Cardiac Risk Stratification   Activity Barriers Neck/Spine Problems   Scoliosis, Metal Plate in R leg   Cardiac Risk Stratification High             6 Minute Walk:  6 Minute Walk     Row Name 05/24/22 1530         6 Minute Walk   Phase Initial     Distance 975 feet     Walk Time 6 minutes     # of Rest Breaks 0     MPH 1.85     METS 1.92     RPE 12     Perceived Dyspnea  0     VO2 Peak 6.71     Symptoms No     Resting HR 58  bpm     Resting BP 114/70     Resting Oxygen Saturation  98 %     Exercise Oxygen Saturation  during 6 min walk 96 %     Max Ex. HR 97 bpm     Max Ex. BP 140/82     2 Minute Post BP 116/78              Oxygen Initial Assessment:   Oxygen Re-Evaluation:   Oxygen Discharge (Final Oxygen Re-Evaluation):   Initial Exercise Prescription:  Initial Exercise Prescription - 05/24/22 1500       Date of Initial Exercise RX and Referring Provider   Date 05/24/22    Referring Provider Dr. Kathlyn Sacramento      Oxygen   Maintain Oxygen Saturation 88% or higher      NuStep   Level 2    SPM 80    Minutes 15    METs 1.92      Biostep-RELP   Level 1    SPM 50    Minutes 15    METs 1.92      Track   Laps 22    Minutes 15    METs 2.2      Prescription Details   Frequency (times per week) 2    Duration Progress to 30 minutes of continuous aerobic without signs/symptoms of physical distress      Intensity   THRR 40-80% of Max Heartrate 92-126    Ratings of Perceived Exertion 11-13    Perceived Dyspnea 0-4      Progression   Progression Continue to progress workloads to maintain intensity without signs/symptoms of  physical distress.      Resistance Training   Training Prescription Yes    Weight 2 lb    Reps 10-15             Perform Capillary Blood Glucose checks as needed.  Exercise Prescription Changes:   Exercise Prescription Changes     Row Name 05/24/22 1500 06/07/22 1500 06/21/22 1300 07/06/22 1100 07/18/22 1300     Response to Exercise   Blood Pressure (Admit) 114/70 104/58 118/62 142/78 124/64   Blood Pressure (Exercise) 140/82 124/80 126/72 -- --   Blood Pressure (Exit) 116/78 104/66 104/62 126/78 110/68   Heart Rate (Admit) 58 bpm 77 bpm 90 bpm 75 bpm 73 bpm   Heart Rate (Exercise) 97 bpm 107 bpm 116 bpm 102 bpm 113 bpm   Heart Rate (Exit) 56 bpm 86 bpm 79 bpm 81 bpm 73 bpm   Oxygen Saturation (Admit) 98 % -- -- -- --   Oxygen Saturation (Exercise) 96 % -- -- -- --   Rating of Perceived Exertion (Exercise) 12 13 13 13 15    Perceived Dyspnea (Exercise) 0 -- -- -- --   Symptoms None -- none none none   Comments 6MWT Results First two days of exercise -- -- --   Duration -- Progress to 30 minutes of  aerobic without signs/symptoms of physical distress Continue with 30 min of aerobic exercise without signs/symptoms of physical distress. Continue with 30 min of aerobic exercise without signs/symptoms of physical distress. Continue with 30 min of aerobic exercise without signs/symptoms of physical distress.   Intensity -- THRR unchanged THRR unchanged THRR unchanged THRR unchanged     Progression   Progression -- Continue to progress workloads to maintain intensity without signs/symptoms of physical distress. Continue to progress workloads to maintain intensity without signs/symptoms of physical distress. Continue to  progress workloads to maintain intensity without signs/symptoms of physical distress. Continue to progress workloads to maintain intensity without signs/symptoms of physical distress.   Average METs -- 1.93 2.51 2.77 2.3     Resistance Training   Training Prescription  -- Yes Yes Yes Yes   Weight -- 2 lb 2 lb 2 lb 2 lb   Reps -- 10-15 10-15 10-15 10-15     Interval Training   Interval Training -- No No No No     Recumbant Bike   Level -- -- -- 3 --   Watts -- -- -- 25 --   Minutes -- -- -- 15 --   METs -- -- -- 3.13 --     NuStep   Level -- 2 5 5 5    Minutes -- 15 15 15 15    METs -- 2.1 2.7 2 2.9     Recumbant Elliptical   Level -- -- -- -- 1   Minutes -- -- -- -- 15   METs -- -- -- -- 1.3     REL-XR   Level -- -- -- 3 5   Minutes -- -- -- 15 15   METs -- -- -- 2.7 3     Biostep-RELP   Level -- 1 5 5 5    Minutes -- 15 15 15 15    METs -- 2 3 2 2      Track   Laps -- 13 -- -- --   Minutes -- 15 -- -- --   METs -- 1.71 -- -- --     Oxygen   Maintain Oxygen Saturation -- 88% or higher 88% or higher 88% or higher 88% or higher            Exercise Comments:   Exercise Comments     Row Name 05/29/22 1132           Exercise Comments First full day of exercise!  Patient was oriented to gym and equipment including functions, settings, policies, and procedures.  Patient's individual exercise prescription and treatment plan were reviewed.  All starting workloads were established based on the results of the 6 minute walk test done at initial orientation visit.  The plan for exercise progression was also introduced and progression will be customized based on patient's performance and goals.                Exercise Goals and Review:   Exercise Goals     Row Name 05/24/22 1533             Exercise Goals   Increase Physical Activity Yes       Intervention Provide advice, education, support and counseling about physical activity/exercise needs.;Develop an individualized exercise prescription for aerobic and resistive training based on initial evaluation findings, risk stratification, comorbidities and participant's personal goals.       Expected Outcomes Short Term: Attend rehab on a regular basis to increase amount of  physical activity.;Long Term: Add in home exercise to make exercise part of routine and to increase amount of physical activity.;Long Term: Exercising regularly at least 3-5 days a week.       Increase Strength and Stamina Yes       Intervention Provide advice, education, support and counseling about physical activity/exercise needs.;Develop an individualized exercise prescription for aerobic and resistive training based on initial evaluation findings, risk stratification, comorbidities and participant's personal goals.       Expected Outcomes Short Term: Increase workloads from initial exercise prescription  for resistance, speed, and METs.;Short Term: Perform resistance training exercises routinely during rehab and add in resistance training at home;Long Term: Improve cardiorespiratory fitness, muscular endurance and strength as measured by increased METs and functional capacity (6MWT)       Able to understand and use rate of perceived exertion (RPE) scale Yes       Intervention Provide education and explanation on how to use RPE scale       Expected Outcomes Short Term: Able to use RPE daily in rehab to express subjective intensity level;Long Term:  Able to use RPE to guide intensity level when exercising independently       Able to understand and use Dyspnea scale Yes       Intervention Provide education and explanation on how to use Dyspnea scale       Expected Outcomes Short Term: Able to use Dyspnea scale daily in rehab to express subjective sense of shortness of breath during exertion;Long Term: Able to use Dyspnea scale to guide intensity level when exercising independently       Knowledge and understanding of Target Heart Rate Range (THRR) Yes       Intervention Provide education and explanation of THRR including how the numbers were predicted and where they are located for reference       Expected Outcomes Long Term: Able to use THRR to govern intensity when exercising independently;Short Term:  Able to use daily as guideline for intensity in rehab;Short Term: Able to state/look up THRR       Able to check pulse independently Yes       Intervention Provide education and demonstration on how to check pulse in carotid and radial arteries.;Review the importance of being able to check your own pulse for safety during independent exercise       Expected Outcomes Short Term: Able to explain why pulse checking is important during independent exercise;Long Term: Able to check pulse independently and accurately       Understanding of Exercise Prescription Yes       Intervention Provide education, explanation, and written materials on patient's individual exercise prescription       Expected Outcomes Short Term: Able to explain program exercise prescription;Long Term: Able to explain home exercise prescription to exercise independently                Exercise Goals Re-Evaluation :  Exercise Goals Re-Evaluation     Row Name 05/29/22 1132 06/07/22 1523 06/21/22 1302 07/06/22 1103 07/18/22 1401     Exercise Goal Re-Evaluation   Exercise Goals Review Increase Strength and Stamina;Able to understand and use rate of perceived exertion (RPE) scale;Able to understand and use Dyspnea scale;Understanding of Exercise Prescription Increase Strength and Stamina;Understanding of Exercise Prescription;Increase Physical Activity Increase Strength and Stamina;Understanding of Exercise Prescription;Increase Physical Activity Increase Strength and Stamina;Understanding of Exercise Prescription;Increase Physical Activity Increase Strength and Stamina;Understanding of Exercise Prescription;Increase Physical Activity   Comments Reviewed RPE scale, THR and program prescription with pt today.  Pt voiced understanding and was given a copy of goals to take home. Jobi is off to a good start in the program. She had an average MET level of 1.93 METs during her first two sessions of exercise. She was also able to walk up to  13 laps in the track. She was also able to tolerate level 2 on the T4 Nustep and level 1 on the Biostep. We will continue to monitor his progress in the program. Rheya is doing well  in rehab. Her overall METs averaged around 2.5. She has only worked on the [employer] as she declines to walk at rehab as she states she can do that at home. Staff has already discussed with patient. She did, however, ncrease to level 5 on both of her seated machines! She is still hitting her THR as well. We will continue to monitor, Jolyn is doing well in rehab. Her overall average METs increased to 2.77. She has continued to only do seated machines as she states she can walk at home. However, she did well with her first time on the XR at level 3 and the recumbent bike at level 3 also. We will continue to monitor her progress in the program. Rumer continues to do well in rehab, She did increase to level 5 on the XR, which she continues to stay at on her other seated machines as well. She is still not incorportating walking in her regimen. She could benefit from increasing to 3 lb for handweights. Will continue to monitor.   Expected Outcomes Short: Use RPE daily to regulate intensity. Long: Follow program prescription in THR. Short: Continue to follow current exercise prescription. Long: Continue to attend rehab. Short: Try out other seated machines to mix up exercise routine Long: Continue to increase overall MET level and stamina Short: Continue to progressively increase workloads. Long: Continue to improve strength and stamina. Short: Increase to 3 lb handweights Long: Continue to increase overall MET level and stamina            Discharge Exercise Prescription (Final Exercise Prescription Changes):  Exercise Prescription Changes - 07/18/22 1300       Response to Exercise   Blood Pressure (Admit) 124/64    Blood Pressure (Exit) 110/68    Heart Rate (Admit) 73 bpm    Heart Rate (Exercise) 113 bpm     Heart Rate (Exit) 73 bpm    Rating of Perceived Exertion (Exercise) 15    Symptoms none    Duration Continue with 30 min of aerobic exercise without signs/symptoms of physical distress.    Intensity THRR unchanged      Progression   Progression Continue to progress workloads to maintain intensity without signs/symptoms of physical distress.    Average METs 2.3      Resistance Training   Training Prescription Yes    Weight 2 lb    Reps 10-15      Interval Training   Interval Training No      NuStep   Level 5    Minutes 15    METs 2.9      Recumbant Elliptical   Level 1    Minutes 15    METs 1.3      REL-XR   Level 5    Minutes 15    METs 3      Biostep-RELP   Level 5    Minutes 15    METs 2      Oxygen   Maintain Oxygen Saturation 88% or higher             Nutrition:  Target Goals: Understanding of nutrition guidelines, daily intake of sodium 1500mg , cholesterol 200mg , calories 30% from fat and 7% or less from saturated fats, daily to have 5 or more servings of fruits and vegetables.  Education: All About Nutrition: -Group instruction provided by verbal, written material, interactive activities, discussions, models, and posters to present general guidelines for heart healthy nutrition including fat, fiber, MyPlate,  the role of sodium in heart healthy nutrition, utilization of the nutrition label, and utilization of this knowledge for meal planning. Follow up email sent as well. Written material given at graduation. Flowsheet Row Cardiac Rehab from 07/12/2022 in [facility] Cardiac and Pulmonary Rehab  Education need identified 05/24/22       Biometrics:  Pre Biometrics - 05/24/22 1534       Pre Biometrics   Height 5\' 3"  (1.6 m)    Weight 154 lb 3.2 oz (69.9 kg)    Waist Circumference 41 inches    Hip Circumference 41 inches    Waist to Hip Ratio 1 %    BMI (Calculated) 27.32    Single Leg Stand 30 seconds   L             Nutrition Therapy Plan  and Nutrition Goals:  Nutrition Therapy & Goals - 06/26/22 1157       Nutrition Therapy   Diet Heart healthy, low Na    Protein (specify units) 80-85g    Fiber 25 grams    Whole Grain Foods 3 servings    Saturated Fats 12 max. grams    Fruits and Vegetables 8 servings/day    Sodium 1.5 grams      Personal Nutrition Goals   Nutrition Goal ST: include microwave whole grains, pre-prepared chicken, and frozen fruits and vegetables to her meals when they sound good to improve nutrition while her appetite is lower and her preferences have changed. Once appetite improves limiting satured fat to <12g/day LT: meet protein/energy needs, maintain weight, limit Na <1.5g/day, include 25g of fiber/day, limit saturated fat <12g/day    Comments 77 y.o. F admitted to cardiac rehab s/p STEMI. PMHx includes CAD, HFrEF, HTN, HLD, GERD, hypothyroidism, scoliosis, previous tobacco use, diverticulosis. Reviewed relevant medications furosemide, synthroid, cholecalciferol. Most recent labs reviewed. Sarajean cancelled her nutrition appointment as she was nervous it would be boring or difficult to understand due to many terms such as the different types of fat and cholesterol; spoke to her during exercise. Discussed nutrition in more broad terms focusing on types of food to choose as well as considerations for her specific conditions and concerns. Madysyn reports since her STEMI having lower appetite as well as loss of interest in most foods she used to enjoy - she reports losing some weight, but has since been weight stable; discussed how this weight loss was more than likely muscle tissue and discussed importance of meeting calorie and protein needs trying to include more heart healthy choices when possible. She reports using premier protein and eating ice cream as her main source of nutrition, but she feels she has been including more types of food as she used to and feels her appetite has been improving. Jhoanna also reports  not cooking; she usually goes out to eat and will make easy to put together meals at home. Discussed heart healthy eay to prep meals like frozen/packaged microwave whole grains, frozen and pre-cut fruits/vegetables, beans/lentils, nuts/seeds and nut/seed butter, and rotisserie chicken (keeping in mind sodium content) for easy meals.      Intervention Plan   Intervention Prescribe, educate and counsel regarding individualized specific dietary modifications aiming towards targeted core components such as weight, hypertension, lipid management, diabetes, heart failure and other comorbidities.    Expected Outcomes Short Term Goal: Understand basic principles of dietary content, such as calories, fat, sodium, cholesterol and nutrients.;Short Term Goal: A plan has been developed with personal nutrition goals set during dietitian  appointment.;Long Term Goal: Adherence to prescribed nutrition plan.             Nutrition Assessments:  MEDIFICTS Score Key: ?70 Need to make dietary changes  40-70 Heart Healthy Diet ? 40 Therapeutic Level Cholesterol Diet  Flowsheet Row Cardiac Rehab from 05/24/2022 in [facility] Cardiac and Pulmonary Rehab  Picture Your Plate Total Score on Admission 62      Picture Your Plate Scores: [id number] Unhealthy dietary pattern with much room for improvement. 41-50 Dietary pattern unlikely to meet recommendations for good health and room for improvement. 51-60 More healthful dietary pattern, with some room for improvement.  >60 Healthy dietary pattern, although there may be some specific behaviors that could be improved.    Nutrition Goals Re-Evaluation:  Nutrition Goals Re-Evaluation     [place] Name 06/12/22 1111             Goals   Current Weight 151 lb (68.5 kg)       Nutrition Goal Meet with the dietitian.       Comment Shereka would like to meet with the dietitian       Expected Outcome Short: meet with dietitian. Long: adhere to a diet that pertains to her.                 Nutrition Goals Discharge (Final Nutrition Goals Re-Evaluation):  Nutrition Goals Re-Evaluation - 06/12/22 1111       Goals   Current Weight 151 lb (68.5 kg)    Nutrition Goal Meet with the dietitian.    Comment Eveline would like to meet with the dietitian    Expected Outcome Short: meet with dietitian. Long: adhere to a diet that pertains to her.             Psychosocial: Target Goals: Acknowledge presence or absence of significant depression and/or stress, maximize coping skills, provide positive support system. Participant is able to verbalize types and ability to use techniques and skills needed for reducing stress and depression.   Education: Stress, Anxiety, and Depression - Group verbal and visual presentation to define topics covered.  Reviews how body is impacted by stress, anxiety, and depression.  Also discusses healthy ways to reduce stress and to treat/manage anxiety and depression.  Written material given at graduation. Flowsheet Row Cardiac Rehab from 07/12/2022 in [facility] Cardiac and Pulmonary Rehab  Date 07/12/22  Educator [facility]  Instruction Review Code 1- Verbalizes Understanding       Education: Sleep Hygiene -Provides group verbal and written instruction about how sleep can affect your health.  Define sleep hygiene, discuss sleep cycles and impact of sleep habits. Review good sleep hygiene tips.    Initial Review & Psychosocial Screening:  Initial Psych Review & Screening - 05/22/22 1119       Initial Review   Current issues with None Identified      Family Dynamics   Good Support System? Yes    Comments She can look to her daughter and gradaughter for support. She has the typical days where she can feel depressed and stressed but in a "normal".      Barriers   Psychosocial barriers to participate in program There are no identifiable barriers or psychosocial needs.      Screening Interventions   Interventions Encouraged to exercise;To provide  support and resources with identified psychosocial needs;Provide feedback about the scores to participant    Expected Outcomes Short Term goal: Utilizing psychosocial counselor, staff and physician to assist with identification of  specific Stressors or current issues interfering with healing process. Setting desired goal for each stressor or current issue identified.;Long Term Goal: Stressors or current issues are controlled or eliminated.;Long Term goal: The participant improves quality of Life and PHQ9 Scores as seen by post scores and/or verbalization of changes;Short Term goal: Identification and review with participant of any Quality of Life or Depression concerns found by scoring the questionnaire.             Quality of Life Scores:   Quality of Life - 05/24/22 1524       Quality of Life   Select Quality of Life      Quality of Life Scores   Health/Function Pre 23.11 %    Socioeconomic Pre 30 %    Psych/Spiritual Pre 29.14 %    Family Pre 18 %    GLOBAL Pre 25.22 %            Scores of 19 and below usually indicate a poorer quality of life in these areas.  A difference of  2-3 points is a clinically meaningful difference.  A difference of 2-3 points in the total score of the Quality of Life Index has been associated with significant improvement in overall quality of life, self-image, physical symptoms, and general health in studies assessing change in quality of life.  PHQ-9: Review Flowsheet       05/24/2022  Depression screen PHQ 2/9  Decreased Interest 0  Down, Depressed, Hopeless 0  PHQ - 2 Score 0  Altered sleeping 0  Tired, decreased energy 0  Change in appetite 1  Feeling bad or failure about yourself  0  Trouble concentrating 0  Moving slowly or fidgety/restless 0  Suicidal thoughts 0  PHQ-9 Score 1  Difficult doing work/chores Not difficult at all   Interpretation of Total Score  Total Score Depression Severity:  1-4 = Minimal depression, 5-9 = Mild  depression, 10-14 = Moderate depression, 15-19 = Moderately severe depression, 20-27 = Severe depression   Psychosocial Evaluation and Intervention:  Psychosocial Evaluation - 05/22/22 1121       Psychosocial Evaluation & Interventions   Interventions Encouraged to exercise with the program and follow exercise prescription;Relaxation education;Stress management education    Comments She can look to her daughter and gradaughter for support. She has the typical days where she can feel depressed and stressed but in a "normal".    Expected Outcomes Short: Start [facility] to help with mood. Long: Maintain a healthy mental state    Continue Psychosocial Services  Follow up required by staff             Psychosocial Re-Evaluation:  Psychosocial Re-Evaluation     Row Name 06/12/22 1114 07/10/22 1118           Psychosocial Re-Evaluation   Current issues with None Identified None Identified      Comments Patient reports no issues with their current mental states, sleep, stress, depression or anxiety. Will follow up with patient in a few weeks for any changes. Patient reports no issues with their current mental states, sleep, stress, depression or anxiety. Will follow up with patient in a few weeks for any changes.      Expected Outcomes Short: Continue to exercise regularly to support mental health and notify staff of any changes. Long: maintain mental health and well being through teaching of rehab or prescribed medications independently. Short: Continue to exercise regularly to support mental health and notify staff of any changes.  Long: maintain good mental health habits.      Interventions Encouraged to attend Cardiac Rehabilitation for the exercise Encouraged to attend Cardiac Rehabilitation for the exercise      Continue Psychosocial Services  Follow up required by staff Follow up required by staff               Psychosocial Discharge (Final Psychosocial Re-Evaluation):   Psychosocial Re-Evaluation - 07/10/22 1118       Psychosocial Re-Evaluation   Current issues with None Identified    Comments Patient reports no issues with their current mental states, sleep, stress, depression or anxiety. Will follow up with patient in a few weeks for any changes.    Expected Outcomes Short: Continue to exercise regularly to support mental health and notify staff of any changes. Long: maintain good mental health habits.    Interventions Encouraged to attend Cardiac Rehabilitation for the exercise    Continue Psychosocial Services  Follow up required by staff             Vocational Rehabilitation: Provide vocational rehab assistance to qualifying candidates.   Vocational Rehab Evaluation & Intervention:   Education: Education Goals: Education classes will be provided on a variety of topics geared toward better understanding of heart health and risk factor modification. Participant will state understanding/return demonstration of topics presented as noted by education test scores.  Learning Barriers/Preferences:  Learning Barriers/Preferences - 05/22/22 1112       Learning Barriers/Preferences   Learning Barriers None    Learning Preferences None             General Cardiac Education Topics:  AED/CPR: - Group verbal and written instruction with the use of models to demonstrate the basic use of the AED with the basic ABC's of resuscitation.   Anatomy and Cardiac Procedures: - Group verbal and visual presentation and models provide information about basic cardiac anatomy and function. Reviews the testing methods done to diagnose heart disease and the outcomes of the test results. Describes the treatment choices: Medical Management, Angioplasty, or Coronary Bypass Surgery for treating various heart conditions including Myocardial Infarction, Angina, Valve Disease, and Cardiac Arrhythmias.  Written material given at graduation. Flowsheet Row Cardiac Rehab  from 07/12/2022 in [facility] Cardiac and Pulmonary Rehab  Education need identified 05/24/22  Date 06/07/22  Educator SB  Instruction Review Code 1- Verbalizes Understanding       Medication Safety: - Group verbal and visual instruction to review commonly prescribed medications for heart and lung disease. Reviews the medication, class of the drug, and side effects. Includes the steps to properly store meds and maintain the prescription regimen.  Written material given at graduation. Flowsheet Row Cardiac Rehab from 07/12/2022 in [facility] Cardiac and Pulmonary Rehab  Date 06/21/22  Educator MS  Instruction Review Code 1- Verbalizes Understanding       Intimacy: - Group verbal instruction through game format to discuss how heart and lung disease can affect sexual intimacy. Written material given at graduation..   Know Your Numbers and Heart Failure: - Group verbal and visual instruction to discuss disease risk factors for cardiac and pulmonary disease and treatment options.  Reviews associated critical values for Overweight/Obesity, Hypertension, Cholesterol, and Diabetes.  Discusses basics of heart failure: signs/symptoms and treatments.  Introduces Heart Failure Zone chart for action plan for heart failure.  Written material given at graduation. Flowsheet Row Cardiac Rehab from 07/12/2022 in [facility] Cardiac and Pulmonary Rehab  Date 06/28/22  Educator [facility]  Instruction Review  Code 1- Verbalizes Understanding       Infection Prevention: - Provides verbal and written material to individual with discussion of infection control including proper hand washing and proper equipment cleaning during exercise session. Flowsheet Row Cardiac Rehab from 07/12/2022 in [facility] Cardiac and Pulmonary Rehab  Date 05/22/22  Educator [facility]  Instruction Review Code 1- Verbalizes Understanding       Falls Prevention: - Provides verbal and written material to individual with discussion of falls prevention and  safety. Flowsheet Row Cardiac Rehab from 07/12/2022 in [facility] Cardiac and Pulmonary Rehab  Date 05/22/22  Educator [facility]  Instruction Review Code 1- Verbalizes Understanding       Other: -Provides group and verbal instruction on various topics (see comments) Flowsheet Row Cardiac Rehab from 07/12/2022 in [facility] Cardiac and Pulmonary Rehab  Date 06/14/22  Educator SB  Instruction Review Code 1- Verbalizes Understanding  [Jeopardy]       Knowledge Questionnaire Score:  Knowledge Questionnaire Score - 05/24/22 1519       Knowledge Questionnaire Score   Pre Score 22/26             Core Components/Risk Factors/Patient Goals at Admission:  Personal Goals and Risk Factors at Admission - 05/24/22 1520       Core Components/Risk Factors/Patient Goals on Admission    Weight Management Yes;Weight Maintenance    Intervention Weight Management: Develop a combined nutrition and exercise program designed to reach desired caloric intake, while maintaining appropriate intake of nutrient and fiber, sodium and fats, and appropriate energy expenditure required for the weight goal.;Weight Management: Provide education and appropriate resources to help participant work on and attain dietary goals.;Weight Management/Obesity: Establish reasonable short term and long term weight goals.    Admit Weight 154 lb 3.2 oz (69.9 kg)    Goal Weight: Short Term 150 lb (68 kg)    Goal Weight: Long Term 145 lb (65.8 kg)    Expected Outcomes Short Term: Continue to assess and modify interventions until short term weight is achieved;Long Term: Adherence to nutrition and physical activity/exercise program aimed toward attainment of established weight goal;Weight Maintenance: Understanding of the daily nutrition guidelines, which includes 25-35% calories from fat, 7% or less cal from saturated fats, less than 200mg  cholesterol, less than 1.5gm of sodium, & 5 or more servings of fruits and vegetables daily;Understanding  recommendations for meals to include 15-35% energy as protein, 25-35% energy from fat, 35-60% energy from carbohydrates, less than 200mg  of dietary cholesterol, 20-35 gm of total fiber daily;Understanding of distribution of calorie intake throughout the day with the consumption of 4-5 meals/snacks    Hypertension Yes    Intervention Provide education on lifestyle modifcations including regular physical activity/exercise, weight management, moderate sodium restriction and increased consumption of fresh fruit, vegetables, and low fat dairy, alcohol moderation, and smoking cessation.;Monitor prescription use compliance.    Expected Outcomes Short Term: Continued assessment and intervention until BP is < 140/64mm HG in hypertensive participants. < 130/65mm HG in hypertensive participants with diabetes, heart failure or chronic kidney disease.;Long Term: Maintenance of blood pressure at goal levels.    Lipids Yes    Intervention Provide education and support for participant on nutrition & aerobic/resistive exercise along with prescribed medications to achieve LDL 70mg , HDL >40mg .    Expected Outcomes Short Term: Participant states understanding of desired cholesterol values and is compliant with medications prescribed. Participant is following exercise prescription and nutrition guidelines.;Long Term: Cholesterol controlled with medications as prescribed, with individualized exercise RX and  with personalized nutrition plan. Value goals: LDL < 70mg , HDL > 40 mg.             Education:Diabetes - Individual verbal and written instruction to review signs/symptoms of diabetes, desired ranges of glucose level fasting, after meals and with exercise. Acknowledge that pre and post exercise glucose checks will be done for 3 sessions at entry of program.   Core Components/Risk Factors/Patient Goals Review:   Goals and Risk Factor Review     Row Name 06/12/22 1115 07/10/22 1115           Core  Components/Risk Factors/Patient Goals Review   Personal Goals Review Weight Management/Obesity;Hypertension Weight Management/Obesity;Hypertension;Lipids      Review Paticia states that she does not check her blood pressure at home. She is doing well since starting the program with her weight and blood pressure. Her blood pressure was 124/62 today. She has no questions about her medications at this time. Aiyah states that she has been maintaining her weight around 150 lbs, but would like to be in the 140's. She reports that she is taking all her medication and managing her risk factors with meds, nutrition, and exercise.      Expected Outcomes Short: maintain weight.. Long: maintain blood pressure and weight independently. Short: maintain weight/work on staying in the 140's lbs range. Long: coninue to control cardiac risk factors.               Core Components/Risk Factors/Patient Goals at Discharge (Final Review):   Goals and Risk Factor Review - 07/10/22 1115       Core Components/Risk Factors/Patient Goals Review   Personal Goals Review Weight Management/Obesity;Hypertension;Lipids    Review Nickol states that she has been maintaining her weight around 150 lbs, but would like to be in the 140's. She reports that she is taking all her medication and managing her risk factors with meds, nutrition, and exercise.    Expected Outcomes Short: maintain weight/work on staying in the 140's lbs range. Long: coninue to control cardiac risk factors.             ITP Comments:  ITP Comments     Row Name 05/22/22 1111 05/24/22 1516 05/29/22 1132 06/21/22 1232 07/19/22 0812   ITP Comments Virtual Visit completed. Patient informed on EP and RD appointment and 6 Minute walk test. Patient also informed of patient health questionnaires on My Chart. Patient Verbalizes understanding. Visit diagnosis can be found in [facility] 04/03/2022. Completed 6MWT and gym orientation. Initial ITP created and sent for review  to Dr. Emily Filbert, Medical Director. First full day of exercise!  Patient was oriented to gym and equipment including functions, settings, policies, and procedures.  Patient's individual exercise prescription and treatment plan were reviewed.  All starting workloads were established based on the results of the 6 minute walk test done at initial orientation visit.  The plan for exercise progression was also introduced and progression will be customized based on patient's performance and goals. 30 day review completed. ITP sent to Dr. Emily Filbert, Medical Director of Cardiac Rehab. Continue with ITP unless changes are made by physician. 30 Day review completed. Medical Director ITP review done, changes made as directed, and signed approval by Medical Director.            Comments:

## 2022-07-19 NOTE — Progress Notes (Signed)
Daily Session Note  Patient Details  Name: Lisa Hale MRN: [id number] Date of Birth: Dec 14, 1945 Referring Provider:   Flowsheet Row Cardiac Rehab from 05/24/2022 in [facility] Cardiac and Pulmonary Rehab  Referring Provider Dr. Kathlyn Sacramento       Encounter Date: 07/19/2022  Check In:  Session Check In - 07/19/22 1113       Check-In   Supervising physician immediately available to respond to emergencies See telemetry face sheet for immediately available ER MD    Location [facility]-Cardiac & Pulmonary Rehab    Staff Present Darlyne Russian, RN, ADN;Meredith Sherryll Burger, RN BSN;Joseph Hood, RCP,RRT,BSRT;Noah Tickle, BS, Exercise Physiologist    Virtual Visit No    Medication changes reported     No    Fall or balance concerns reported    No    Tobacco Cessation No Change    Warm-up and Cool-down Performed on first and last piece of equipment    Resistance Training Performed Yes    VAD Patient? No    PAD/SET Patient? No      Pain Assessment   Currently in Pain? No/denies                Social History   Tobacco Use  Smoking Status Former   Packs/day: 0.25   Years: 15.00   Additional pack years: 0.00   Total pack years: 3.75   Types: Cigarettes   Quit date: 10/29/2021   Years since quitting: 0.7  Smokeless Tobacco Never  Tobacco Comments   She has been on and off smoking since she was 77 years old.    Goals Met:  Independence with exercise equipment Exercise tolerated well No report of concerns or symptoms today Strength training completed today  Goals Unmet:  Not Applicable  Comments: Pt able to follow exercise prescription today without complaint.  Will continue to monitor for progression.    Dr. Emily Filbert is Medical Director for [place].  Dr. Ottie Glazier is Medical Director for [facility] Pulmonary Rehabilitation.

## 2022-07-21 ENCOUNTER — Other Ambulatory Visit: Payer: Self-pay

## 2022-07-21 MED ORDER — CARVEDILOL 3.125 MG PO TABS: 3.1250 mg | ORAL_TABLET | Freq: Two times a day (BID) | ORAL | 0 refills | Status: DC

## 2022-07-24 ENCOUNTER — Encounter: Payer: Medicare Other | Admitting: *Deleted

## 2022-07-24 DIAGNOSIS — Z955 Presence of coronary angioplasty implant and graft: Secondary | ICD-10-CM

## 2022-07-24 DIAGNOSIS — I213 ST elevation (STEMI) myocardial infarction of unspecified site: Secondary | ICD-10-CM

## 2022-07-24 NOTE — Progress Notes (Signed)
Daily Session Note  Patient Details  Name: Lisa Hale MRN: [id number] Date of Birth: January 05, 1946 Referring Provider:   Flowsheet Row Cardiac Rehab from 05/24/2022 in [facility] Cardiac and Pulmonary Rehab  Referring Provider Dr. Kathlyn Sacramento       Encounter Date: 07/24/2022  Check In:  Session Check In - 07/24/22 1123       Check-In   Supervising physician immediately available to respond to emergencies See telemetry face sheet for immediately available ER MD    Location [facility]-Cardiac & Pulmonary Rehab    Staff Present Darlyne Russian, RN, Doyce Para, BS, ACSM CEP, Exercise Physiologist;Susanne Bice, RN, BSN, CCRP;Noah Tickle, BS, Exercise Physiologist    Virtual Visit No    Medication changes reported     No    Fall or balance concerns reported    No    Tobacco Cessation No Change    Warm-up and Cool-down Performed on first and last piece of equipment    Resistance Training Performed Yes    VAD Patient? No    PAD/SET Patient? No      Pain Assessment   Currently in Pain? No/denies                Social History   Tobacco Use  Smoking Status Former   Packs/day: 0.25   Years: 15.00   Additional pack years: 0.00   Total pack years: 3.75   Types: Cigarettes   Quit date: 10/29/2021   Years since quitting: 0.7  Smokeless Tobacco Never  Tobacco Comments   She has been on and off smoking since she was 77 years old.    Goals Met:  Independence with exercise equipment Exercise tolerated well No report of concerns or symptoms today Strength training completed today  Goals Unmet:  Not Applicable  Comments: Pt able to follow exercise prescription today without complaint.  Will continue to monitor for progression.    Dr. Emily Filbert is Medical Director for [place].  Dr. Ottie Glazier is Medical Director for [facility] Pulmonary Rehabilitation.

## 2022-07-26 ENCOUNTER — Encounter: Payer: Medicare Other | Admitting: *Deleted

## 2022-07-26 DIAGNOSIS — I213 ST elevation (STEMI) myocardial infarction of unspecified site: Secondary | ICD-10-CM

## 2022-07-26 DIAGNOSIS — Z955 Presence of coronary angioplasty implant and graft: Secondary | ICD-10-CM | POA: Diagnosis not present

## 2022-07-26 NOTE — Progress Notes (Signed)
Daily Session Note  Patient Details  Name: Lisa Hale MRN: [id number] Date of Birth: May 29, 1945 Referring Provider:   Flowsheet Row Cardiac Rehab from 05/24/2022 in [facility] Cardiac and Pulmonary Rehab  Referring Provider Dr. Kathlyn Sacramento       Encounter Date: 07/26/2022  Check In:  Session Check In - 07/26/22 1114       Check-In   Supervising physician immediately available to respond to emergencies See telemetry face sheet for immediately available ER MD    Location [facility]-Cardiac & Pulmonary Rehab    Staff Present Antionette Fairy, BS, Exercise Physiologist;Joseph Parkline, RCP,RRT,BSRT;October Peery [place] RN, Odelia Gage, RN, [place]    Virtual Visit No    Medication changes reported     Yes    Comments Added Repatha injections    Fall or balance concerns reported    No    Tobacco Cessation No Change    Warm-up and Cool-down Performed on first and last piece of equipment    Resistance Training Performed Yes    VAD Patient? No    PAD/SET Patient? No      Pain Assessment   Currently in Pain? No/denies                Social History   Tobacco Use  Smoking Status Former   Packs/day: 0.25   Years: 15.00   Additional pack years: 0.00   Total pack years: 3.75   Types: Cigarettes   Quit date: 10/29/2021   Years since quitting: 0.7  Smokeless Tobacco Never  Tobacco Comments   She has been on and off smoking since she was 77 years old.    Goals Met:  Independence with exercise equipment Exercise tolerated well No report of concerns or symptoms today Strength training completed today  Goals Unmet:  Not Applicable  Comments: Pt able to follow exercise prescription today without complaint.  Will continue to monitor for progression.    Dr. Emily Filbert is Medical Director for [place].  Dr. Ottie Glazier is Medical Director for [facility] Pulmonary Rehabilitation.

## 2022-07-31 ENCOUNTER — Encounter: Payer: Medicare Other | Admitting: *Deleted

## 2022-08-02 ENCOUNTER — Encounter: Payer: Medicare Other | Attending: Cardiovascular Disease | Admitting: *Deleted

## 2022-08-02 DIAGNOSIS — Z955 Presence of coronary angioplasty implant and graft: Secondary | ICD-10-CM | POA: Diagnosis present

## 2022-08-02 DIAGNOSIS — I213 ST elevation (STEMI) myocardial infarction of unspecified site: Secondary | ICD-10-CM | POA: Diagnosis present

## 2022-08-02 NOTE — Progress Notes (Signed)
Daily Session Note  Patient Details  Name: Mckenzi Zieser MRN: [id number] Date of Birth: 1945/10/29 Referring Provider:   Flowsheet Row Cardiac Rehab from 05/24/2022 in [facility] Cardiac and Pulmonary Rehab  Referring Provider Dr. Kathlyn Sacramento       Encounter Date: 08/02/2022  Check In:  Session Check In - 08/02/22 1121       Check-In   Supervising physician immediately available to respond to emergencies See telemetry face sheet for immediately available ER MD    Location [facility]-Cardiac & Pulmonary Rehab    Staff Present Darlyne Russian, RN, ADN;Meredith Sherryll Burger, RN BSN;Joseph Hood, RCP,RRT,BSRT;Noah Tickle, BS, Exercise Physiologist    Virtual Visit No    Medication changes reported     No    Fall or balance concerns reported    No    Tobacco Cessation No Change    Warm-up and Cool-down Performed on first and last piece of equipment    Resistance Training Performed Yes    VAD Patient? No    PAD/SET Patient? No      Pain Assessment   Currently in Pain? No/denies                Social History   Tobacco Use  Smoking Status Former   Packs/day: 0.25   Years: 15.00   Additional pack years: 0.00   Total pack years: 3.75   Types: Cigarettes   Quit date: 10/29/2021   Years since quitting: 0.7  Smokeless Tobacco Never  Tobacco Comments   She has been on and off smoking since she was 77 years old.    Goals Met:  Independence with exercise equipment Exercise tolerated well No report of concerns or symptoms today Strength training completed today  Goals Unmet:  Not Applicable  Comments: Pt able to follow exercise prescription today without complaint.  Will continue to monitor for progression.    Dr. Emily Filbert is Medical Director for [place].  Dr. Ottie Glazier is Medical Director for [facility] Pulmonary Rehabilitation.

## 2022-08-04 NOTE — Progress Notes (Unsigned)
Cardiology Office Note    Date:  08/07/2022   ID:  Ladaja, Yusupov 1946-02-01, MRN [id number]  PCP:  Dortha Kern, MD  Cardiologist:  Lorine Bears, MD  Electrophysiologist:  None   Chief Complaint: Follow-up  History of Present Illness:   Lisa Hale is a 77 y.o. female with history of CAD with anterior ST elevation MI in 03/2022 status post PCI/DES to the LAD, HFrEF secondary to ICM, HTN, HLD, hypothyroidism, scoliosis, prior tobacco use quitting in 1999, and GERD who presents for follow-up of CAD and cardiomyopathy.   She was admitted to the hospital from 12/4 through 04/05/2022 with an anterior ST elevation MI.  Leading up to her admission, she reported substernal chest pain and tightness that radiated to the bilateral arms the day prior.  She was initially evaluated at an urgent care where EKG showed an anterior STEMI with subsequent code STEMI activation and transport to the ED via EMS.  Emergent LHC on 04/03/2022 showed an occluded mid LAD.  In addition, there was severe stenosis in the proximal RCA.  There was moderately reduced LV systolic function with an EF of 35% with akinesis of the mid to distal anterior wall, apex, and distal inferior wall.  Moderately elevated LVEDP of 27 mmHg.  She underwent successful PCI/DES to the mid LAD and successful OCT-guided PCI/DES to the RCA.  Echo during the admission demonstrated an EF of 25 to 30%, akinesis of the mid and distal anterior septum, apical lateral segment, mid inferoseptal segment, apical anterior segment, and apical inferior segment with hypokinesis of the mid inferolateral, mid anterolateral, mid anterior, and mid inferior segments.  There was also grade 1 diastolic dysfunction, mildly reduced RV systolic function with akinesis of the apex, moderately enlarged RV cavity size, mild mitral regurgitation, and aortic valve sclerosis with mild stenosis.  High-sensitivity troponin peaked at greater than 24,000.  LifeVest  was placed prior to discharge, however this had to be discontinued secondary to blisters noted at skin contact points.  She was seen in hospital follow-up on 05/02/2022 and was without symptoms of angina or decompensation.  She felt like she was getting better day by day.  Baseline cognitive deficits were trending back to baseline.  She was adherent and tolerating cardiac medications.  She was transitioned from losartan to [facility] with continuation of carvedilol and furosemide.  Follow-up labs showed persistent transaminitis with newly elevated ALT leading to the suspension of atorvastatin.  With this, LFTs did improve some leading to the resumption of atorvastatin.   She was seen in the office on 05/16/2022 was started on spironolactone 12.5 mg daily.  Repeat LFTs were obtained on statin therapy which again showed elevations in AST/ALT leading to the recommendation to hold atorvastatin and co-Q70moving forward.  She was last seen in the office on 06/19/2022 and remained without symptoms of angina or cardiac decompensation.  She was participating in cardiac rehab.  She reported several episodes of dizziness and difficulty finding words that typically occurred in the evening hours when she was fatigued or looking into a bright light.  MRI of the brain was ordered and is pending.  Zio patch showed a predominant rhythm of sinus with an average rate of 68 bpm (range 47 to 179 bpm), first-degree AV block, 13 episodes of SVT with the fastest interval lasting just 7 beats and the longest interval lasting 10.4 seconds.  There were occasional PVCs with an overall burden of 1.3%.  She comes in accompanied by  her daughter today and is doing very well from a cardiac perspective, without symptoms of angina or cardiac decompensation.  She continues to participate in cardiac rehab.  She enjoys this quite a bit.  No falls or symptoms concerning for bleeding.  No further confusion episodes.  Weight remains stable.  Adherent and  tolerating cardiac medications.   Labs independently reviewed: 06/2022 - Hgb 12.8, PLT 326, potassium 4.2, BUN 29, serum creatinine 1.3, albumin 3.7, AST 45, ALT 50 05/2022 - AST 139, ALT 159 03/2022 - A1c 5.7, magnesium 2.0, TC 283, TG 126, HDL 57, LDL 201  Past Medical History:  Diagnosis Date   Asthma    CHF (congestive heart failure)    Coronary artery disease    GERD (gastroesophageal reflux disease)    Glaucoma    Hypertension    Hypothyroidism    Scoliosis    Seasonal allergies    Skin cancer    Vertigo    Avg: 1x/mo    Past Surgical History:  Procedure Laterality Date   CATARACT EXTRACTION W/PHACO Right 03/13/2022   Procedure: CATARACT EXTRACTION PHACO AND INTRAOCULAR LENS PLACEMENT (IOC) RIGHT WITH HYDRUS MICROSTENT  6.72  00:37.7;  Surgeon: Nevada Crane, MD;  Location: [facility];  Service: Ophthalmology;  Laterality: Right;   CATARACT EXTRACTION W/PHACO Left 03/27/2022   Procedure: CATARACT EXTRACTION PHACO AND INTRAOCULAR LENS PLACEMENT (IOC) LEFT WITH IOL PLUS HYDRUS MICROSTENT 5.21 00:30.9;  Surgeon: Nevada Crane, MD;  Location: [facility];  Service: Ophthalmology;  Laterality: Left;   COLONOSCOPY     COLONOSCOPY WITH PROPOFOL N/A 03/12/2018   Procedure: COLONOSCOPY WITH PROPOFOL;  Surgeon: Pasty Spillers, MD;  Location: [facility];  Service: Endoscopy;  Laterality: N/A;   CORONARY/GRAFT ACUTE MI REVASCULARIZATION N/A 04/03/2022   Procedure: Coronary/Graft Acute MI Revascularization;  Surgeon: Iran Ouch, MD;  Location: [facility] INVASIVE CV [facility];  Service: Cardiovascular;  Laterality: N/A;   LEFT HEART CATH AND CORONARY ANGIOGRAPHY N/A 04/03/2022   Procedure: LEFT HEART CATH AND CORONARY ANGIOGRAPHY;  Surgeon: Iran Ouch, MD;  Location: [facility] INVASIVE CV LAB;  Service: Cardiovascular;  Laterality: N/A;   ORIF TIBIA & FIBULA FRACTURES     TONSILLECTOMY      Current Medications: Current Meds  Medication Sig    albuterol (PROVENTIL HFA;VENTOLIN HFA) 108 (90 Base) MCG/ACT inhaler Inhale into the lungs every 6 (six) hours as needed for wheezing or shortness of breath.   aspirin EC 81 MG tablet Take 1 tablet (81 mg total) by mouth daily. Swallow whole.   carvedilol (COREG) 3.125 MG tablet Take 1 tablet (3.125 mg total) by mouth 2 (two) times daily with a meal.   cholecalciferol 25 MCG (1000 UT) TABS Take by mouth daily.   dextromethorphan-guaiFENesin (ROBITUSSIN-DM) 10-100 MG/5ML liquid Take 5 mLs by mouth every 4 (four) hours as needed for cough.   Evolocumab (REPATHA SURECLICK) 140 MG/ML SOAJ Inject 140 mg into the skin every 14 (fourteen) days.   fluticasone (FLOVENT HFA) 220 MCG/ACT inhaler Inhale into the lungs 2 (two) times daily as needed.   furosemide (LASIX) 20 MG tablet Take 1 tablet (20 mg total) by mouth as needed (as needed for increased shortness of breath or weight gain greater than 3 pounds overnight).   lansoprazole (PREVACID) 15 MG capsule Take 15 mg by mouth daily. Take around 8- 9am.   levothyroxine (SYNTHROID) 100 MCG tablet Take 100 mcg by mouth daily.   montelukast (SINGULAIR) 10 MG tablet Take 10 mg  by mouth at bedtime.   nitroGLYCERIN (NITROSTAT) 0.4 MG SL tablet Take 1 tablet under the tongue every 5 minutes for up to 3 doses as directed   phenazopyridine (PYRIDIUM) 200 MG tablet Take 200 mg by mouth 3 (three) times daily as needed.   sacubitril-valsartan (ENTRESTO) 24-26 MG Take 1 tablet by mouth 2 (two) times daily.   spironolactone (ALDACTONE) 25 MG tablet Take 0.5 tablets (12.5 mg total) by mouth daily.   ticagrelor (BRILINTA) 90 MG TABS tablet Take 1 tablet (90 mg total) by mouth 2 (two) times daily.    Allergies:   Sunflower oil, Tetracycline, Bactrim [sulfamethoxazole-trimethoprim], and Pineapple   Social History   Socioeconomic History   Marital status: Widowed    Spouse name: Not on file   Number of children: Not on file   Years of education: Not on file   Highest  education level: Not on file  Occupational History   Not on file  Tobacco Use   Smoking status: Former    Packs/day: 0.25    Years: 15.00    Additional pack years: 0.00    Total pack years: 3.75    Types: Cigarettes    Quit date: 10/29/2021    Years since quitting: 0.7   Smokeless tobacco: Never   Tobacco comments:    She has been on and off smoking since she was 77 years old.  Vaping Use   Vaping Use: Never used  Substance and Sexual Activity   Alcohol use: Yes    Comment: couple x/yr   Drug use: Never   Sexual activity: Not on file  Other Topics Concern   Not on file  Social History Narrative   Not on file   Social Determinants of Health   Financial Resource Strain: Not on file  Food Insecurity: No Food Insecurity (04/03/2022)   Hunger Vital Sign    Worried About Running Out of Food in the Last Year: Never true    Ran Out of Food in the Last Year: Never true  Transportation Needs: No Transportation Needs (04/03/2022)   PRAPARE - Administrator, Civil ServiceTransportation    Lack of Transportation (Medical): No    Lack of Transportation (Non-Medical): No  Physical Activity: Not on file  Stress: Not on file  Social Connections: Not on file     Family History:  The patient's family history includes Colon polyps in her brother.  ROS:   12-point review of systems is negative unless otherwise noted in the HPI.   EKGs/Labs/Other Studies Reviewed:    Studies reviewed were summarized above. The additional studies were reviewed today:  LHC 04/03/2022:   2nd Diag lesion is 60% stenosed.   Mid LAD to Dist LAD lesion is 100% stenosed.   Mid LAD lesion is 40% stenosed.   Dist LAD lesion is 60% stenosed.   Prox RCA lesion is 60% stenosed.   Prox RCA to Mid RCA lesion is 95% stenosed.   A drug-eluting stent was successfully placed using a STENT ONYX FRONTIER 2.25X38.   A drug-eluting stent was successfully placed using a STENT ONYX FRONTIER 3.0X38.   Post intervention, there is a 0% residual  stenosis.   Post intervention, there is a 0% residual stenosis.   Post intervention, there is a 0% residual stenosis.   There is moderate left ventricular systolic dysfunction.   LV end diastolic pressure is moderately elevated.   1.  Anterior ST elevation myocardial infarction due to an occluded mid LAD.  In addition, there is severe  stenosis in the proximal right coronary artery. 2.  Moderately reduced LV systolic function with an EF of 35% with akinesis of the mid to distal anterior wall, apex and distal inferior wall.  Moderately elevated left ventricular end-diastolic pressure with an LVEDP of 27 mmHg. 3.  Successful angioplasty and drug-eluting stent placement to the mid LAD. 4.  Successful OCT guided PCI and drug-eluting stent placement to the right coronary artery.   Recommendations: Dual antiplatelet therapy for at least 12 months. Aggressive treatment of risk factors. I started small dose carvedilol. __________   2D echo 04/04/2022: 1. Left ventricular ejection fraction, by estimation, is 25 to 30%. The  left ventricle has severely decreased function. The left ventricle  demonstrates regional wall motion abnormalities (see scoring  diagram/findings for description). Left ventricular  diastolic parameters are consistent with Grade I diastolic dysfunction  (impaired relaxation).   2. Right ventricular systolic function is mildly reduced with akinesis of  the apex. The right ventricular size is moderately enlarged.   3. The mitral valve is normal in structure. Mild mitral valve  regurgitation.   4. The aortic valve has an indeterminant number of cusps. There is mild  calcification of the aortic valve. There is moderate thickening of the  aortic valve. Aortic valve regurgitation is not visualized. Mild aortic  valve stenosis. Aortic valve area, by  VTI measures 1.29 cm. Aortic valve mean gradient measures 5.0 mmHg.  __________  Luci Bank patch 06/2022 Patient had a min HR of 47  bpm, max HR of 179 bpm, and avg HR of 68 bpm. Predominant underlying rhythm was Sinus Rhythm. First Degree AV Block was present.  13 Supraventricular Tachycardia runs occurred, the run with the fastest interval lasting 7 beats with a max rate of 179 bpm, the longest lasting 10.4 secs with an avg rate of 138 bpm.  Occasional PVCs with a burden of 1.3%.   EKG:  EKG is ordered today.  The EKG ordered today demonstrates NSR, 60 bpm, right axis deviation, incomplete RBBB with prior septal MI, improving ST-T changes  Recent Labs: 04/03/2022: Magnesium 2.0 06/19/2022: ALT 50; BUN 29; Creatinine, Ser 1.30; Hemoglobin 12.8; Platelets 326; Potassium 4.2; Sodium 137  Recent Lipid Panel    Component Value Date/Time   CHOL 283 (H) 04/03/2022 1003   TRIG 126 04/03/2022 1003   HDL 57 04/03/2022 1003   CHOLHDL 5.0 04/03/2022 1003   VLDL 25 04/03/2022 1003   LDLCALC 201 (H) 04/03/2022 1003    PHYSICAL EXAM:    VS:  BP 116/74   Pulse 60   Ht 5\' 3"  (1.6 m)   Wt 156 lb (70.8 kg)   BMI 27.63 kg/m   BMI: Body mass index is 27.63 kg/m.  Physical Exam Vitals reviewed.  Constitutional:      Appearance: She is well-developed.  HENT:     Head: Normocephalic and atraumatic.  Eyes:     General:        Right eye: No discharge.        Left eye: No discharge.  Neck:     Vascular: No JVD.  Cardiovascular:     Rate and Rhythm: Normal rate and regular rhythm.     Pulses:          Posterior tibial pulses are 2+ on the right side and 2+ on the left side.     Heart sounds: S1 normal and S2 normal. Heart sounds not distant. No midsystolic click and no opening snap. Murmur heard.  Systolic murmur is present with a grade of 1/6 at the upper right sternal border.     No friction rub.  Pulmonary:     Effort: Pulmonary effort is normal. No respiratory distress.     Breath sounds: Normal breath sounds. No decreased breath sounds, wheezing or rales.  Chest:     Chest wall: No tenderness.  Abdominal:      General: There is no distension.  Musculoskeletal:     Cervical back: Normal range of motion.     Right lower leg: No edema.     Left lower leg: No edema.  Skin:    General: Skin is warm and dry.     Nails: There is no clubbing.  Neurological:     Mental Status: She is alert and oriented to person, place, and time.  Psychiatric:        Speech: Speech normal.        Behavior: Behavior normal.        Thought Content: Thought content normal.        Judgment: Judgment normal.     Wt Readings from Last 3 Encounters:  08/07/22 156 lb (70.8 kg)  06/19/22 152 lb (68.9 kg)  05/24/22 154 lb 3.2 oz (69.9 kg)     ASSESSMENT & PLAN:   CAD with anterior ST elevation MI without angina: She continues to do very well and is without symptoms concerning for angina.  Continue DAPT with ASA 81 mg daily and ticagrelor 90 mg twice daily without interruption for a minimum of 12 months from date of PCI (04/03/2022.  Continue aggressive risk factor modification and secondary prevention including carvedilol.  No longer on statin therapy as outlined below given abnormal LFTs.  Now on Repatha.  She is participating with cardiac rehab.  No indication for further ischemic testing at this time.  HFrEF secondary to ICM: She is euvolemic and well compensated with NYHA class II symptoms.  Weight stable.  She did not tolerate LifeVest secondary to blisters noted on skin contact points.  Continue current GDMT including carvedilol, Entresto, spironolactone, and as needed furosemide.  Relative hypotension precludes escalation of GDMT at this time.  She is scheduled for follow-up limited echo to reevaluate cardiomyopathy later this month.  If her EF remains less than 35% at that time, would plan for EP consideration of ICD.  If her cardiomyopathy persists, she may benefit from a viability study.  Aortic stenosis: Mild by echo in 03/2022.  Periodic follow-up.  HTN: Blood pressure is well-controlled in the office today.   Continue medical therapy as outlined above.  HLD: LDL 201 in 03/2022 with goal being less than 55.  Unable to take statins or ezetimibe secondary to worsening liver function.  Now on Repatha.  Obtain lipid panel in 1 to 2 months.  Elevated LFTs: Initially presumed to be in the setting of her MI, however LFT worsened following reinitiation of statin therapy leading therapy to be discontinued.   Disposition: F/u with Dr. Kirke CorinArida or an APP in 3 months.   Medication Adjustments/Labs and Tests Ordered: Current medicines are reviewed at length with the patient today.  Concerns regarding medicines are outlined above. Medication changes, Labs and Tests ordered today are summarized above and listed in the Patient Instructions accessible in Encounters.   SignedEula Listen, Fateh Kindle, PA-C 08/07/2022 11:03 AM     [facility] [place] [address] [place], [place] [address] [phone]

## 2022-08-07 ENCOUNTER — Encounter: Payer: Self-pay | Admitting: Physician Assistant

## 2022-08-07 ENCOUNTER — Encounter: Payer: Medicare Other | Admitting: *Deleted

## 2022-08-07 ENCOUNTER — Ambulatory Visit: Payer: Medicare Other | Attending: Physician Assistant | Admitting: Physician Assistant

## 2022-08-07 VITALS — BP 116/74 | HR 60 | Ht 63.0 in | Wt 156.0 lb

## 2022-08-07 DIAGNOSIS — I255 Ischemic cardiomyopathy: Secondary | ICD-10-CM

## 2022-08-07 DIAGNOSIS — I213 ST elevation (STEMI) myocardial infarction of unspecified site: Secondary | ICD-10-CM

## 2022-08-07 DIAGNOSIS — I5020 Unspecified systolic (congestive) heart failure: Secondary | ICD-10-CM

## 2022-08-07 DIAGNOSIS — E785 Hyperlipidemia, unspecified: Secondary | ICD-10-CM

## 2022-08-07 DIAGNOSIS — I2102 ST elevation (STEMI) myocardial infarction involving left anterior descending coronary artery: Secondary | ICD-10-CM

## 2022-08-07 DIAGNOSIS — R7989 Other specified abnormal findings of blood chemistry: Secondary | ICD-10-CM | POA: Diagnosis present

## 2022-08-07 DIAGNOSIS — I2510 Atherosclerotic heart disease of native coronary artery without angina pectoris: Secondary | ICD-10-CM | POA: Diagnosis not present

## 2022-08-07 DIAGNOSIS — Z955 Presence of coronary angioplasty implant and graft: Secondary | ICD-10-CM

## 2022-08-07 NOTE — Progress Notes (Signed)
Daily Session Note  Patient Details  Name: Lisa Hale MRN: [id number] Date of Birth: [birth year] Referring Provider:   Flowsheet Row Cardiac Rehab from 05/24/2022 in [facility] Card[facility]  Referring Provider Dr. Lorine Bears       Encounter Date: 08/07/2022  Check In:  Session Check In - 08/07/22 1110       Check-In   Supervising physician immediately available to respond to emergencies See telemetry face sheet for immediately available ER MD    Location [facility]-Cardiac & Pulmonary Rehab    Staff Present Lanny Hurst, RN, Franki Monte, BS, ACSM CEP, Exercise Physiologist;Noah Tickle, BS, Exercise Physiologist    Virtual Visit No    Medication changes reported     No    Fall or balance concerns reported    No    Tobacco Cessation No Change    Warm-up and Cool-down Performed on first and last piece of equipment    Resistance Training Performed Yes    VAD Patient? No    PAD/SET Patient? No      Pain Assessment   Currently in Pain? No/denies                Social History   Tobacco Use  Smoking Status Former   Packs/day: 0.25   Years: 15.00   Additional pack years: 0.00   Total pack years: 3.75   Types: Cigarettes   Quit date: 10/29/2021   Years since quitting: 0.7  Smokeless Tobacco Never  Tobacco Comments   She has been on and off smoking since she was 77 years old.    Goals Met:  Independence with exercise equipment Exercise tolerated well Personal goals reviewed No report of concerns or symptoms today Strength training completed today  Goals Unmet:  Not Applicable  Comments: Pt able to follow exercise prescription today without complaint.  Will continue to monitor for progression.    Dr. Bethann Punches is Medical Director for [facility] Cardiac Rehabilitation.  Dr. Vida Rigger is Medical Director for [facility] Pulmonary Rehabilitation.

## 2022-08-07 NOTE — Patient Instructions (Addendum)
Keep scheduled appointment for echocardiogram.   Medication Instructions:  No changes at this time.   *If you need a refill on your cardiac medications before your next appointment, please call your pharmacy*   Lab Work: None  If you have labs (blood work) drawn today and your tests are completely normal, you will receive your results only by: MyChart Message (if you have MyChart) OR A paper copy in the mail If you have any lab test that is abnormal or we need to change your treatment, we will call you to review the results.   Testing/Procedures: None   Follow-Up: At [facility], you and your health needs are our priority.  As part of our continuing mission to provide you with exceptional heart care, we have created designated Provider Care Teams.  These Care Teams include your primary Cardiologist (physician) and Advanced Practice Providers (APPs -  Physician Assistants and Nurse Practitioners) who all work together to provide you with the care you need, when you need it.   Your next appointment:   3 month(s)  Provider:   Lorine Bears, MD or Eula Listen, PA-C

## 2022-08-09 ENCOUNTER — Encounter: Payer: Medicare Other | Admitting: *Deleted

## 2022-08-14 ENCOUNTER — Encounter: Payer: Medicare Other | Admitting: *Deleted

## 2022-08-14 DIAGNOSIS — I213 ST elevation (STEMI) myocardial infarction of unspecified site: Secondary | ICD-10-CM

## 2022-08-14 DIAGNOSIS — Z955 Presence of coronary angioplasty implant and graft: Secondary | ICD-10-CM

## 2022-08-14 NOTE — Progress Notes (Signed)
Daily Session Note  Patient Details  Name: Lisa Hale MRN: [id number] Date of Birth: [birth year] Referring Provider:   Flowsheet Row Cardiac Rehab from 05/24/2022 in [facility] Cardiac and Pulmonary Rehab  Referring Provider Dr. Lorine Bears       Encounter Date: 08/14/2022  Check In:  Session Check In - 08/14/22 1125       Check-In   Supervising physician immediately available to respond to emergencies See telemetry face sheet for immediately available ER MD    Location [facility]-Cardiac & Pulmonary Rehab    Staff Present Lanny Hurst, RN, Franki Monte, BS, ACSM CEP, Exercise Physiologist;Meredith Jewel Baize, RN BSN;Noah Tickle, BS, Exercise Physiologist    Virtual Visit No    Medication changes reported     No    Fall or balance concerns reported    No    Tobacco Cessation No Change    Warm-up and Cool-down Performed on first and last piece of equipment    Resistance Training Performed Yes    VAD Patient? No    PAD/SET Patient? No      Pain Assessment   Currently in Pain? No/denies                Social History   Tobacco Use  Smoking Status Former   Packs/day: 0.25   Years: 15.00   Additional pack years: 0.00   Total pack years: 3.75   Types: Cigarettes   Quit date: 10/29/2021   Years since quitting: 0.7  Smokeless Tobacco Never  Tobacco Comments   She has been on and off smoking since she was 77 years old.    Goals Met:  Independence with exercise equipment Exercise tolerated well No report of concerns or symptoms today Strength training completed today  Goals Unmet:  Not Applicable  Comments: Pt able to follow exercise prescription today without complaint.  Will continue to monitor for progression.    Dr. Bethann Punches is Medical Director for [facility] Cardiac Rehabilitation.  Dr. Vida Rigger is Medical Director for [facility] Pulmonary Rehabilitation.

## 2022-08-16 ENCOUNTER — Encounter: Payer: Medicare Other | Admitting: *Deleted

## 2022-08-16 ENCOUNTER — Ambulatory Visit: Payer: Medicare Other | Attending: Cardiology

## 2022-08-16 ENCOUNTER — Encounter: Payer: Self-pay | Admitting: *Deleted

## 2022-08-16 VITALS — Ht 63.0 in | Wt 159.2 lb

## 2022-08-16 DIAGNOSIS — I5020 Unspecified systolic (congestive) heart failure: Secondary | ICD-10-CM | POA: Diagnosis not present

## 2022-08-16 DIAGNOSIS — Z955 Presence of coronary angioplasty implant and graft: Secondary | ICD-10-CM

## 2022-08-16 DIAGNOSIS — I255 Ischemic cardiomyopathy: Secondary | ICD-10-CM

## 2022-08-16 DIAGNOSIS — I213 ST elevation (STEMI) myocardial infarction of unspecified site: Secondary | ICD-10-CM

## 2022-08-16 DIAGNOSIS — R471 Dysarthria and anarthria: Secondary | ICD-10-CM | POA: Diagnosis present

## 2022-08-16 DIAGNOSIS — I2510 Atherosclerotic heart disease of native coronary artery without angina pectoris: Secondary | ICD-10-CM | POA: Diagnosis not present

## 2022-08-16 DIAGNOSIS — R42 Dizziness and giddiness: Secondary | ICD-10-CM | POA: Diagnosis not present

## 2022-08-16 LAB — ECHOCARDIOGRAM COMPLETE
AR max vel: 1.71 cm2
AV Area VTI: 1.68 cm2
AV Area mean vel: 1.67 cm2
AV Mean grad: 9.5 mmHg
AV Peak grad: 17.1 mmHg
Ao pk vel: 2.07 m/s
Area-P 1/2: 3.22 cm2
Height: 63 in
S' Lateral: 3.4 cm
Weight: 2547.2 oz

## 2022-08-16 MED ORDER — PERFLUTREN LIPID MICROSPHERE
1.0000 mL | INTRAVENOUS | Status: AC | PRN
  Administered 2022-08-16: 2 mL via INTRAVENOUS

## 2022-08-16 NOTE — Patient Instructions (Signed)
Discharge Patient Instructions  Patient Details  Name: Lisa Hale MRN: [id number] Date of Birth: 1945-05-15 Referring Provider:  Dortha Kern, MD   Number of Visits: 82  Reason for Discharge:  Patient reached a stable level of exercise. Patient independent in their exercise. Patient has met program and personal goals.  Diagnosis:  ST elevation myocardial infarction (STEMI), unspecified artery  Status post coronary artery stent placement  Initial Exercise Prescription:  Initial Exercise Prescription - 05/24/22 1500       Date of Initial Exercise RX and Referring Provider   Date 05/24/22    Referring Provider Dr. Lorine Hale      Oxygen   Maintain Oxygen Saturation 88% or higher      NuStep   Level 2    SPM 80    Minutes 15    METs 1.92      Biostep-RELP   Level 1    SPM 50    Minutes 15    METs 1.92      Track   Laps 22    Minutes 15    METs 2.2      Prescription Details   Frequency (times per week) 2    Duration Progress to 30 minutes of continuous aerobic without signs/symptoms of physical distress      Intensity   THRR 40-80% of Max Heartrate 92-126    Ratings of Perceived Exertion 11-13    Perceived Dyspnea 0-4      Progression   Progression Continue to progress workloads to maintain intensity without signs/symptoms of physical distress.      Resistance Training   Training Prescription Yes    Weight 2 lb    Reps 10-15             Discharge Exercise Prescription (Final Exercise Prescription Changes):  Exercise Prescription Changes - 08/03/22 1400       Response to Exercise   Blood Pressure (Admit) 128/62    Blood Pressure (Exit) 128/64    Heart Rate (Admit) 74 bpm    Heart Rate (Exercise) 106 bpm    Heart Rate (Exit) 73 bpm    Rating of Perceived Exertion (Exercise) 15    Symptoms none    Duration Continue with 30 min of aerobic exercise without signs/symptoms of physical distress.    Intensity THRR unchanged       Progression   Progression Continue to progress workloads to maintain intensity without signs/symptoms of physical distress.    Average METs 1.9      Resistance Training   Training Prescription Yes    Weight 2 lb    Reps 10-15      Interval Training   Interval Training No      NuStep   Level 6    Minutes 15    METs 2.2      Recumbant Elliptical   Level 1    Minutes 15    METs 1.3      REL-XR   Level 4    Minutes 15    METs 3.2      Biostep-RELP   Level 6    Minutes 15    METs 2      Oxygen   Maintain Oxygen Saturation 88% or higher             Functional Capacity:  6 Minute Walk     Row Name 05/24/22 1530 08/16/22 1119       6 Minute Walk   Phase  Initial Discharge    Distance 975 feet 1035 feet    Distance % Change -- 6.2 %    Distance Feet Change -- 60 ft    Walk Time 6 minutes 6 minutes    # of Rest Breaks 0 0    MPH 1.85 1.96    METS 1.92 1.86    RPE 12 13    Perceived Dyspnea  0 0    VO2 Peak 6.71 6.5    Symptoms No No    Resting HR 58 bpm 68 bpm    Resting BP 114/70 118/66    Resting Oxygen Saturation  98 % 98 %    Exercise Oxygen Saturation  during 6 min walk 96 % 94 %    Max Ex. HR 97 bpm 95 bpm    Max Ex. BP 140/82 126/64    2 Minute Post BP 116/78 --            Nutrition & Weight - Outcomes:  Pre Biometrics - 05/24/22 1534       Pre Biometrics   Height 5\' 3"  (1.6 m)    Weight 154 lb 3.2 oz (69.9 kg)    Waist Circumference 41 inches    Hip Circumference 41 inches    Waist to Hip Ratio 1 %    BMI (Calculated) 27.32    Single Leg Stand 30 seconds   L            Post Biometrics - 08/16/22 1123        Post  Biometrics   Height 5\' 3"  (1.6 m)    Weight 159 lb 3.2 oz (72.2 kg)    Waist Circumference 39 inches    Hip Circumference 38.5 inches    Waist to Hip Ratio 1.01 %    BMI (Calculated) 28.21    Single Leg Stand 30 seconds             Nutrition:  Nutrition Therapy & Goals - 06/26/22 1157        Nutrition Therapy   Diet Heart healthy, low Na    Protein (specify units) 80-85g    Fiber 25 grams    Whole Grain Foods 3 servings    Saturated Fats 12 max. grams    Fruits and Vegetables 8 servings/day    Sodium 1.5 grams      Personal Nutrition Goals   Nutrition Goal ST: include microwave whole grains, pre-prepared chicken, and frozen fruits and vegetables to her meals when they sound good to improve nutrition while her appetite is lower and her preferences have changed. Once appetite improves limiting satured fat to <12g/day LT: meet protein/energy needs, maintain weight, limit Na <1.5g/day, include 25g of fiber/day, limit saturated fat <12g/day    Comments 77 y.o. F admitted to cardiac rehab s/p STEMI. PMHx includes CAD, HFrEF, HTN, HLD, GERD, hypothyroidism, scoliosis, previous tobacco use, diverticulosis. Reviewed relevant medications furosemide, synthroid, cholecalciferol. Most recent labs reviewed. Lisa Hale cancelled her nutrition appointment as she was nervous it would be boring or difficult to understand due to many terms such as the different types of fat and cholesterol; spoke to her during exercise. Discussed nutrition in more broad terms focusing on types of food to choose as well as considerations for her specific conditions and concerns. Lisa Hale reports since her STEMI having lower appetite as well as loss of interest in most foods she used to enjoy - she reports losing some weight, but has since been weight stable; discussed how this  weight loss was more than likely muscle tissue and discussed importance of meeting calorie and protein needs trying to include more heart healthy choices when possible. She reports using premier protein and eating ice cream as her main source of nutrition, but she feels she has been including more types of food as she used to and feels her appetite has been improving. Lisa Hale also reports not cooking; she usually goes out to eat and will make easy to put together  meals at home. Discussed heart healthy eay to prep meals like frozen/packaged microwave whole grains, frozen and pre-cut fruits/vegetables, beans/lentils, nuts/seeds and nut/seed butter, and rotisserie chicken (keeping in mind sodium content) for easy meals.      Intervention Plan   Intervention Prescribe, educate and counsel regarding individualized specific dietary modifications aiming towards targeted core components such as weight, hypertension, lipid management, diabetes, heart failure and other comorbidities.    Expected Outcomes Short Term Goal: Understand basic principles of dietary content, such as calories, fat, sodium, cholesterol and nutrients.;Short Term Goal: A plan has been developed with personal nutrition goals set during dietitian appointment.;Long Term Goal: Adherence to prescribed nutrition plan.

## 2022-08-16 NOTE — Progress Notes (Signed)
Cardiac Individual Treatment Plan  Patient Details  Name: Lisa Hale MRN: [id number] Date of Birth: [birth year] Referring Provider:   Flowsheet Row Cardiac Rehab from 05/24/2022 in [facility] Cardiac and Pulmonary Rehab  Referring Provider Dr. Lorine Bears       Initial Encounter Date:  Flowsheet Row Cardiac Rehab from 05/24/2022 in [facility] Cardiac and Pulmonary Rehab  Date 05/24/22       Visit Diagnosis: ST elevation myocardial infarction (STEMI), unspecified artery  Status post coronary artery stent placement  Patient's Home Medications on Admission:  Current Outpatient Medications:    albuterol (PROVENTIL HFA;VENTOLIN HFA) 108 (90 Base) MCG/ACT inhaler, Inhale into the lungs every 6 (six) hours as needed for wheezing or shortness of breath., Disp: , Rfl:    aspirin EC 81 MG tablet, Take 1 tablet (81 mg total) by mouth daily. Swallow whole., Disp: 90 tablet, Rfl: 3   carvedilol (COREG) 3.125 MG tablet, Take 1 tablet (3.125 mg total) by mouth 2 (two) times daily with a meal., Disp: 180 tablet, Rfl: 0   cholecalciferol 25 MCG (1000 UT) TABS, Take by mouth daily., Disp: , Rfl:    dextromethorphan-guaiFENesin (ROBITUSSIN-DM) 10-100 MG/5ML liquid, Take 5 mLs by mouth every 4 (four) hours as needed for cough., Disp: , Rfl:    Evolocumab (REPATHA SURECLICK) 140 MG/ML SOAJ, Inject 140 mg into the skin every 14 (fourteen) days., Disp: 2 mL, Rfl: 11   fluticasone (FLOVENT HFA) 220 MCG/ACT inhaler, Inhale into the lungs 2 (two) times daily as needed., Disp: , Rfl:    furosemide (LASIX) 20 MG tablet, Take 1 tablet (20 mg total) by mouth as needed (as needed for increased shortness of breath or weight gain greater than 3 pounds overnight)., Disp: 90 tablet, Rfl: 3   lansoprazole (PREVACID) 15 MG capsule, Take 15 mg by mouth daily. Take around 8- 9am., Disp: , Rfl:    levothyroxine (SYNTHROID) 100 MCG tablet, Take 100 mcg by mouth daily., Disp: , Rfl:    montelukast (SINGULAIR) 10 MG tablet,  Take 10 mg by mouth at bedtime., Disp: , Rfl:    nitroGLYCERIN (NITROSTAT) 0.4 MG SL tablet, Take 1 tablet under the tongue every 5 minutes for up to 3 doses as directed, Disp: 25 tablet, Rfl: 1   phenazopyridine (PYRIDIUM) 200 MG tablet, Take 200 mg by mouth 3 (three) times daily as needed., Disp: , Rfl:    sacubitril-valsartan (ENTRESTO) 24-26 MG, Take 1 tablet by mouth 2 (two) times daily., Disp: 60 tablet, Rfl: 1   spironolactone (ALDACTONE) 25 MG tablet, Take 0.5 tablets (12.5 mg total) by mouth daily., Disp: 45 tablet, Rfl: 3   ticagrelor (BRILINTA) 90 MG TABS tablet, Take 1 tablet (90 mg total) by mouth 2 (two) times daily., Disp: 60 tablet, Rfl: 11  Past Medical History: Past Medical History:  Diagnosis Date   Asthma    CHF (congestive heart failure)    Coronary artery disease    GERD (gastroesophageal reflux disease)    Glaucoma    Hypertension    Hypothyroidism    Scoliosis    Seasonal allergies    Skin cancer    Vertigo    Avg: 1x/mo    Tobacco Use: Social History   Tobacco Use  Smoking Status Former   Packs/day: 0.25   Years: 15.00   Additional pack years: 0.00   Total pack years: 3.75   Types: Cigarettes   Quit date: 10/29/2021   Years since quitting: 0.7  Smokeless Tobacco Never  Tobacco Comments  She has been on and off smoking since she was 77 years old.    Labs: Review Flowsheet       Latest Ref Rng & Units 04/03/2022  Labs for ITP Cardiac and Pulmonary Rehab  Cholestrol 0 - 200 mg/dL 161   LDL (calc) 0 - 99 mg/dL 096   HDL-C >04 mg/dL 57   Trlycerides <540 mg/dL 981   Hemoglobin X9J 4.8 - 5.6 % 5.7      Exercise Target Goals: Exercise Program Goal: Individual exercise prescription set using results from initial 6 min walk test and THRR while considering  patient's activity barriers and safety.   Exercise Prescription Goal: Initial exercise prescription builds to 30-45 minutes a day of aerobic activity, 2-3 days per week.  Home exercise  guidelines will be given to patient during program as part of exercise prescription that the participant will acknowledge.   Education: Aerobic Exercise: - Group verbal and visual presentation on the components of exercise prescription. Introduces F.I.T.T principle from ACSM for exercise prescriptions.  Reviews F.I.T.T. principles of aerobic exercise including progression. Written material given at graduation. Flowsheet Row Cardiac Rehab from 08/02/2022 in [facility] Cardiac and Pulmonary Rehab  Date 07/26/22  Educator [facility]  Instruction Review Code 1- Verbalizes Understanding       Education: Resistance Exercise: - Group verbal and visual presentation on the components of exercise prescription. Introduces F.I.T.T principle from ACSM for exercise prescriptions  Reviews F.I.T.T. principles of resistance exercise including progression. Written material given at graduation. Flowsheet Row Cardiac Rehab from 08/02/2022 in [facility] Cardiac and Pulmonary Rehab  Date 08/02/22  Educator KW  Instruction Review Code 1- [employer] Understanding        Education: Exercise & Equipment Safety: - Individual verbal instruction and demonstration of equipment use and safety with use of the equipment. Flowsheet Row Cardiac Rehab from 08/02/2022 in [facility] Cardiac and Pulmonary Rehab  Date 05/22/22  Educator [facility]  Instruction Review Code 1- Verbalizes Understanding       Education: Exercise Physiology & General Exercise Guidelines: - Group verbal and written instruction with models to review the exercise physiology of the cardiovascular system and associated critical values. Provides general exercise guidelines with specific guidelines to those with heart or lung disease.  Flowsheet Row Cardiac Rehab from 08/02/2022 in [facility] Cardiac and Pulmonary Rehab  Date 07/19/22  Educator [facility]  Instruction Review Code 1- Verbalizes Understanding       Education: Flexibility, Balance, Mind/Body Relaxation: - Group verbal and visual  presentation with interactive activity on the components of exercise prescription. Introduces F.I.T.T principle from ACSM for exercise prescriptions. Reviews F.I.T.T. principles of flexibility and balance exercise training including progression. Also discusses the mind body connection.  Reviews various relaxation techniques to help reduce and manage stress (i.e. Deep breathing, progressive muscle relaxation, and visualization). Balance handout provided to take home. Written material given at graduation. Flowsheet Row Cardiac Rehab from 08/02/2022 in [facility] Cardiac and Pulmonary Rehab  Date 08/02/22  Educator KW  Instruction Review Code 1- Verbalizes Understanding       Activity Barriers & Risk Stratification:  Activity Barriers & Cardiac Risk Stratification - 05/24/22 1532       Activity Barriers & Cardiac Risk Stratification   Activity Barriers Neck/Spine Problems   Scoliosis, Metal Plate in R leg   Cardiac Risk Stratification High             6 Minute Walk:  6 Minute Walk     Row Name 05/24/22 1530  6 Minute Walk   Phase Initial     Distance 975 feet     Walk Time 6 minutes     # of Rest Breaks 0     MPH 1.85     METS 1.92     RPE 12     Perceived Dyspnea  0     VO2 Peak 6.71     Symptoms No     Resting HR 58 bpm     Resting BP 114/70     Resting Oxygen Saturation  98 %     Exercise Oxygen Saturation  during 6 min walk 96 %     Max Ex. HR 97 bpm     Max Ex. BP 140/82     2 Minute Post BP 116/78              Oxygen Initial Assessment:   Oxygen Re-Evaluation:   Oxygen Discharge (Final Oxygen Re-Evaluation):   Initial Exercise Prescription:  Initial Exercise Prescription - 05/24/22 1500       Date of Initial Exercise RX and Referring Provider   Date 05/24/22    Referring Provider Dr. Lorine Bears      Oxygen   Maintain Oxygen Saturation 88% or higher      NuStep   Level 2    SPM 80    Minutes 15    METs 1.92      Biostep-RELP    Level 1    SPM 50    Minutes 15    METs 1.92      Track   Laps 22    Minutes 15    METs 2.2      Prescription Details   Frequency (times per week) 2    Duration Progress to 30 minutes of continuous aerobic without signs/symptoms of physical distress      Intensity   THRR 40-80% of Max Heartrate 92-126    Ratings of Perceived Exertion 11-13    Perceived Dyspnea 0-4      Progression   Progression Continue to progress workloads to maintain intensity without signs/symptoms of physical distress.      Resistance Training   Training Prescription Yes    Weight 2 lb    Reps 10-15             Perform Capillary Blood Glucose checks as needed.  Exercise Prescription Changes:   Exercise Prescription Changes     Row Name 05/24/22 1500 06/07/22 1500 06/21/22 1300 07/06/22 1100 07/18/22 1300     Response to Exercise   Blood Pressure (Admit) 114/70 104/58 118/62 142/78 124/64   Blood Pressure (Exercise) 140/82 124/80 126/72 -- --   Blood Pressure (Exit) 116/78 104/66 104/62 126/78 110/68   Heart Rate (Admit) 58 bpm 77 bpm 90 bpm 75 bpm 73 bpm   Heart Rate (Exercise) 97 bpm 107 bpm 116 bpm 102 bpm 113 bpm   Heart Rate (Exit) 56 bpm 86 bpm 79 bpm 81 bpm 73 bpm   Oxygen Saturation (Admit) 98 % -- -- -- --   Oxygen Saturation (Exercise) 96 % -- -- -- --   Rating of Perceived Exertion (Exercise) 12 13 13 13 15    Perceived Dyspnea (Exercise) 0 -- -- -- --   Symptoms None -- none none none   Comments Results First two days of exercise -- -- --   Duration -- Progress to 30 minutes of  aerobic without signs/symptoms of physical distress Continue with 30 min of aerobic  exercise without signs/symptoms of physical distress. Continue with 30 min of aerobic exercise without signs/symptoms of physical distress. Continue with 30 min of aerobic exercise without signs/symptoms of physical distress.   Intensity -- THRR unchanged THRR unchanged THRR unchanged THRR unchanged     Progression    Progression -- Continue to progress workloads to maintain intensity without signs/symptoms of physical distress. Continue to progress workloads to maintain intensity without signs/symptoms of physical distress. Continue to progress workloads to maintain intensity without signs/symptoms of physical distress. Continue to progress workloads to maintain intensity without signs/symptoms of physical distress.   Average METs -- 1.93 2.51 2.77 2.3     Resistance Training   Training Prescription -- Yes Yes Yes Yes   Weight -- 2 lb 2 lb 2 lb 2 lb   Reps -- 10-15 10-15 10-15 10-15     Interval Training   Interval Training -- No No No No     Recumbant Bike   Level -- -- -- 3 --   Watts -- -- -- 25 --   Minutes -- -- -- 15 --   METs -- -- -- 3.13 --     NuStep   Level -- 2 5 5 5    Minutes -- 15 15 15 15    METs -- 2.1 2.7 2 2.9     Recumbant Elliptical   Level -- -- -- -- 1   Minutes -- -- -- -- 15   METs -- -- -- -- 1.3     REL-XR   Level -- -- -- 3 5   Minutes -- -- -- 15 15   METs -- -- -- 2.7 3     Biostep-RELP   Level -- 1 5 5 5    Minutes -- 15 15 15 15    METs -- 2 3 2 2      Track   Laps -- 13 -- -- --   Minutes -- 15 -- -- --   METs -- 1.71 -- -- --     Oxygen   Maintain Oxygen Saturation -- 88% or higher 88% or higher 88% or higher 88% or higher    Row Name 08/03/22 1400             Response to Exercise   Blood Pressure (Admit) 128/62       Blood Pressure (Exit) 128/64       Heart Rate (Admit) 74 bpm       Heart Rate (Exercise) 106 bpm       Heart Rate (Exit) 73 bpm       Rating of Perceived Exertion (Exercise) 15       Symptoms none       Duration Continue with 30 min of aerobic exercise without signs/symptoms of physical distress.       Intensity THRR unchanged         Progression   Progression Continue to progress workloads to maintain intensity without signs/symptoms of physical distress.       Average METs 1.9         Resistance Training   Training  Prescription Yes       Weight 2 lb       Reps 10-15         Interval Training   Interval Training No         NuStep   Level 6       Minutes 15       METs 2.2  Recumbant Elliptical   Level 1       Minutes 15       METs 1.3         REL-XR   Level 4       Minutes 15       METs 3.2         Biostep-RELP   Level 6       Minutes 15       METs 2         Oxygen   Maintain Oxygen Saturation 88% or higher                Exercise Comments:   Exercise Comments     Row Name 05/29/22 1132           Exercise Comments First full day of exercise!  Patient was oriented to gym and equipment including functions, settings, policies, and procedures.  Patient's individual exercise prescription and treatment plan were reviewed.  All starting workloads were established based on the results of the 6 minute walk test done at initial orientation visit.  The plan for exercise progression was also introduced and progression will be customized based on patient's performance and goals.                Exercise Goals and Review:   Exercise Goals     Row Name 05/24/22 1533             Exercise Goals   Increase Physical Activity Yes       Intervention Provide advice, education, support and counseling about physical activity/exercise needs.;Develop an individualized exercise prescription for aerobic and resistive training based on initial evaluation findings, risk stratification, comorbidities and participant's personal goals.       Expected Outcomes Short Term: Attend rehab on a regular basis to increase amount of physical activity.;Long Term: Add in home exercise to make exercise part of routine and to increase amount of physical activity.;Long Term: Exercising regularly at least 3-5 days a week.       Increase Strength and Stamina Yes       Intervention Provide advice, education, support and counseling about physical activity/exercise needs.;Develop an individualized exercise  prescription for aerobic and resistive training based on initial evaluation findings, risk stratification, comorbidities and participant's personal goals.       Expected Outcomes Short Term: Increase workloads from initial exercise prescription for resistance, speed, and METs.;Short Term: Perform resistance training exercises routinely during rehab and add in resistance training at home;Long Term: Improve cardiorespiratory fitness, muscular endurance and strength as measured by increased METs and functional capacity ( )       Able to understand and use rate of perceived exertion (RPE) scale Yes       Intervention Provide education and explanation on how to use RPE scale       Expected Outcomes Short Term: Able to use RPE daily in rehab to express subjective intensity level;Long Term:  Able to use RPE to guide intensity level when exercising independently       Able to understand and use Dyspnea scale Yes       Intervention Provide education and explanation on how to use Dyspnea scale       Expected Outcomes Short Term: Able to use Dyspnea scale daily in rehab to express subjective sense of shortness of breath during exertion;Long Term: Able to use Dyspnea scale to guide intensity level when exercising independently       Knowledge and understanding  of Target Heart Rate Range (THRR) Yes       Intervention Provide education and explanation of THRR including how the numbers were predicted and where they are located for reference       Expected Outcomes Long Term: Able to use THRR to govern intensity when exercising independently;Short Term: Able to use daily as guideline for intensity in rehab;Short Term: Able to state/look up THRR       Able to check pulse independently Yes       Intervention Provide education and demonstration on how to check pulse in carotid and radial arteries.;Review the importance of being able to check your own pulse for safety during independent exercise       Expected Outcomes  Short Term: Able to explain why pulse checking is important during independent exercise;Long Term: Able to check pulse independently and accurately       Understanding of Exercise Prescription Yes       Intervention Provide education, explanation, and written materials on patient's individual exercise prescription       Expected Outcomes Short Term: Able to explain program exercise prescription;Long Term: Able to explain home exercise prescription to exercise independently                Exercise Goals Re-Evaluation :  Exercise Goals Re-Evaluation     Row Name 05/29/22 1132 06/07/22 1523 06/21/22 1302 07/06/22 1103 07/18/22 1401     Exercise Goal Re-Evaluation   Exercise Goals Review Increase Strength and Stamina;Able to understand and use rate of perceived exertion (RPE) scale;Able to understand and use Dyspnea scale;Understanding of Exercise Prescription Increase Strength and Stamina;Understanding of Exercise Prescription;Increase Physical Activity Increase Strength and Stamina;Understanding of Exercise Prescription;Increase Physical Activity Increase Strength and Stamina;Understanding of Exercise Prescription;Increase Physical Activity Increase Strength and Stamina;Understanding of Exercise Prescription;Increase Physical Activity   Comments Reviewed RPE scale, THR and program prescription with pt today.  Pt voiced understanding and was given a copy of goals to take home. Mariame is off to a good start in the program. She had an average MET level of 1.93 METs during her first two sessions of exercise. She was also able to walk up to 13 laps in the track. She was also able to tolerate level 2 on the T4 Nustep and level 1 on the Biostep. We will continue to monitor his progress in the program. Camrin is doing well in rehab. Her overall METs averaged around 2.5. She has only worked on the [employer] and [employer] as she declines to walk at rehab as she states she can do that at home. Staff has already  discussed with patient. She did, however, ncrease to level 5 on both of her seated machines! She is still hitting her THR as well. We will continue to monitor, [place] is doing well in rehab. Her overall average METs increased to 2.77. She has continued to only do seated machines as she states she can walk at home. However, she did well with her first time on the XR at level 3 and the recumbent bike at level 3 also. We will continue to monitor her progress in the program. Robi continues to do well in rehab, She did increase to level 5 on the XR, which she continues to stay at on her other seated machines as well. She is still not incorportating walking in her regimen. She could benefit from increasing to 3 lb for handweights. Will continue to monitor.   Expected Outcomes Short: Use RPE daily to  regulate intensity. Long: Follow program prescription in THR. Short: Continue to follow current exercise prescription. Long: Continue to attend rehab. Short: Try out other seated machines to mix up exercise routine Long: Continue to increase overall MET level and stamina Short: Continue to progressively increase workloads. Long: Continue to improve strength and stamina. Short: Increase to 3 lb handweights Long: Continue to increase overall MET level and stamina    Row Name 08/03/22 1446 08/07/22 1118           Exercise Goal Re-Evaluation   Exercise Goals Review Increase Strength and Stamina;Understanding of Exercise Prescription;Increase Physical Activity Increase Strength and Stamina;Understanding of Exercise Prescription;Increase Physical Activity      Comments Sophya continues to do well in the program. She did improve to level 6 on both the T4 nustep and the biostep, while staying consistent on the recument elliptical and XR. She still has not done any walking while at rehab becuase she states "she can walk at home". She also has continued to use 2 lb hand weights for resistance training. We will continue to  monitor her progress. Massie consistently comes to cardiac rehab consistently. She is walking at home 4-5 days a week for 30 min as well. She feels like she is continually gaining strength and states that she is able to do more at home then before. She has not yet moved up to 3 pound weights but plans to try them in class today.      Expected Outcomes Short: Increase to 3 lb handweights. Long: Continue to improve strength and stamina. Short: Increase to 3 lb handweights. Long: Continue to improve strength and stamina.               Discharge Exercise Prescription (Final Exercise Prescription Changes):  Exercise Prescription Changes - 08/03/22 1400       Response to Exercise   Blood Pressure (Admit) 128/62    Blood Pressure (Exit) 128/64    Heart Rate (Admit) 74 bpm    Heart Rate (Exercise) 106 bpm    Heart Rate (Exit) 73 bpm    Rating of Perceived Exertion (Exercise) 15    Symptoms none    Duration Continue with 30 min of aerobic exercise without signs/symptoms of physical distress.    Intensity THRR unchanged      Progression   Progression Continue to progress workloads to maintain intensity without signs/symptoms of physical distress.    Average METs 1.9      Resistance Training   Training Prescription Yes    Weight 2 lb    Reps 10-15      Interval Training   Interval Training No      NuStep   Level 6    Minutes 15    METs 2.2      Recumbant Elliptical   Level 1    Minutes 15    METs 1.3      REL-XR   Level 4    Minutes 15    METs 3.2      Biostep-RELP   Level 6    Minutes 15    METs 2      Oxygen   Maintain Oxygen Saturation 88% or higher             Nutrition:  Target Goals: Understanding of nutrition guidelines, daily intake of sodium 1500mg , cholesterol 200mg , calories 30% from fat and 7% or less from saturated fats, daily to have 5 or more servings of fruits and vegetables.  Education:  All About Nutrition: -Group instruction provided by  verbal, written material, interactive activities, discussions, models, and posters to present general guidelines for heart healthy nutrition including fat, fiber, MyPlate, the role of sodium in heart healthy nutrition, utilization of the nutrition label, and utilization of this knowledge for meal planning. Follow up email sent as well. Written material given at graduation. Flowsheet Row Cardiac Rehab from 08/02/2022 in [facility] Cardiac and Pulmonary Rehab  Education need identified 05/24/22       Biometrics:  Pre Biometrics - 05/24/22 1534       Pre Biometrics   Height 5\' 3"  (1.6 m)    Weight 154 lb 3.2 oz (69.9 kg)    Waist Circumference 41 inches    Hip Circumference 41 inches    Waist to Hip Ratio 1 %    BMI (Calculated) 27.32    Single Leg Stand 30 seconds   L             Nutrition Therapy Plan and Nutrition Goals:  Nutrition Therapy & Goals - 06/26/22 1157       Nutrition Therapy   Diet Heart healthy, low Na    Protein (specify units) 80-85g    Fiber 25 grams    Whole Grain Foods 3 servings    Saturated Fats 12 max. grams    Fruits and Vegetables 8 servings/day    Sodium 1.5 grams      Personal Nutrition Goals   Nutrition Goal ST: include microwave whole grains, pre-prepared chicken, and frozen fruits and vegetables to her meals when they sound good to improve nutrition while her appetite is lower and her preferences have changed. Once appetite improves limiting satured fat to <12g/day LT: meet protein/energy needs, maintain weight, limit Na <1.5g/day, include 25g of fiber/day, limit saturated fat <12g/day    Comments 77 y.o. F admitted to cardiac rehab s/p STEMI. PMHx includes CAD, HFrEF, HTN, HLD, GERD, hypothyroidism, scoliosis, previous tobacco use, diverticulosis. Reviewed relevant medications furosemide, synthroid, cholecalciferol. Most recent labs reviewed. Feliz cancelled her nutrition appointment as she was nervous it would be boring or difficult to understand due  to many terms such as the different types of fat and cholesterol; spoke to her during exercise. Discussed nutrition in more broad terms focusing on types of food to choose as well as considerations for her specific conditions and concerns. Tuwanna reports since her STEMI having lower appetite as well as loss of interest in most foods she used to enjoy - she reports losing some weight, but has since been weight stable; discussed how this weight loss was more than likely muscle tissue and discussed importance of meeting calorie and protein needs trying to include more heart healthy choices when possible. She reports using premier protein and eating ice cream as her main source of nutrition, but she feels she has been including more types of food as she used to and feels her appetite has been improving. Ivett also reports not cooking; she usually goes out to eat and will make easy to put together meals at home. Discussed heart healthy eay to prep meals like frozen/packaged microwave whole grains, frozen and pre-cut fruits/vegetables, beans/lentils, nuts/seeds and nut/seed butter, and rotisserie chicken (keeping in mind sodium content) for easy meals.      Intervention Plan   Intervention Prescribe, educate and counsel regarding individualized specific dietary modifications aiming towards targeted core components such as weight, hypertension, lipid management, diabetes, heart failure and other comorbidities.    Expected Outcomes Short Term Goal:  Understand basic principles of dietary content, such as calories, fat, sodium, cholesterol and nutrients.;Short Term Goal: A plan has been developed with personal nutrition goals set during dietitian appointment.;Long Term Goal: Adherence to prescribed nutrition plan.             Nutrition Assessments:  MEDIFICTS Score Key: ?70 Need to make dietary changes  40-70 Heart Healthy Diet ? 40 Therapeutic Level Cholesterol Diet  Flowsheet Row Cardiac Rehab from  05/24/2022 in [facility] Cardiac and Pulmonary Rehab  Picture Your Plate Total Score on Admission 62      Picture Your Plate Scores: <29 Unhealthy dietary pattern with much room for improvement. 41-50 Dietary pattern unlikely to meet recommendations for good health and room for improvement. 51-60 More healthful dietary pattern, with some room for improvement.  >60 Healthy dietary pattern, although there may be some specific behaviors that could be improved.    Nutrition Goals Re-Evaluation:  Nutrition Goals Re-Evaluation     Row Name 06/12/22 1111 08/07/22 1138           Goals   Current Weight 151 lb (68.5 kg) --      Nutrition Goal Meet with the dietitian. ST: include microwave whole grains, pre-prepared chicken, and frozen fruits and vegetables to her meals when they sound good to improve nutrition while her appetite is lower and her preferences have changed. Once appetite improves limiting satured fat to <12g/day LT: meet protein/energy needs, maintain weight, limit Na <1.5g/day, include 25g of fiber/day, limit saturated fat <12g/day      Comment Darothy would like to meet with the dietitian Carrah did get a change to meet with dietian and is trying to implement some of the recommendations. Her tastes have changed since her heart event, which has made it a little more difficult to eat certain foods. She eats out a lot because she does not like to cook , but does try to watch her sodium intake.      Expected Outcome Short: meet with dietitian. Long: adhere to a diet that pertains to her. Short: try cooking at home a little more Long: maintain heart healthy eating plan.               Nutrition Goals Discharge (Final Nutrition Goals Re-Evaluation):  Nutrition Goals Re-Evaluation - 08/07/22 1138       Goals   Nutrition Goal ST: include microwave whole grains, pre-prepared chicken, and frozen fruits and vegetables to her meals when they sound good to improve nutrition while her appetite is  lower and her preferences have changed. Once appetite improves limiting satured fat to <12g/day LT: meet protein/energy needs, maintain weight, limit Na <1.5g/day, include 25g of fiber/day, limit saturated fat <12g/day    Comment Ottilia did get a change to meet with dietian and is trying to implement some of the recommendations. Her tastes have changed since her heart event, which has made it a little more difficult to eat certain foods. She eats out a lot because she does not like to cook , but does try to watch her sodium intake.    Expected Outcome Short: try cooking at home a little more Long: maintain heart healthy eating plan.             Psychosocial: Target Goals: Acknowledge presence or absence of significant depression and/or stress, maximize coping skills, provide positive support system. Participant is able to verbalize types and ability to use techniques and skills needed for reducing stress and depression.   Education: Stress,  Anxiety, and Depression - Group verbal and visual presentation to define topics covered.  Reviews how body is impacted by stress, anxiety, and depression.  Also discusses healthy ways to reduce stress and to treat/manage anxiety and depression.  Written material given at graduation. Flowsheet Row Cardiac Rehab from 08/02/2022 in [facility] Cardiac and Pulmonary Rehab  Date 07/12/22  Educator [facility]  Instruction Review Code 1- [employer] Understanding       Education: Sleep Hygiene -Provides group verbal and written instruction about how sleep can affect your health.  Define sleep hygiene, discuss sleep cycles and impact of sleep habits. Review good sleep hygiene tips.    Initial Review & Psychosocial Screening:  Initial Psych Review & Screening - 05/22/22 1119       Initial Review   Current issues with None Identified      Family Dynamics   Good Support System? Yes    Comments She can look to her daughter and gradaughter for support. She has the typical  days where she can feel depressed and stressed but in a "normal".      Barriers   Psychosocial barriers to participate in program There are no identifiable barriers or psychosocial needs.      Screening Interventions   Interventions Encouraged to exercise;To provide support and resources with identified psychosocial needs;Provide feedback about the scores to participant    Expected Outcomes Short Term goal: Utilizing psychosocial counselor, staff and physician to assist with identification of specific Stressors or current issues interfering with healing process. Setting desired goal for each stressor or current issue identified.;Long Term Goal: Stressors or current issues are controlled or eliminated.;Long Term goal: The participant improves quality of Life and PHQ9 Scores as seen by post scores and/or verbalization of changes;Short Term goal: Identification and review with participant of any Quality of Life or Depression concerns found by scoring the questionnaire.             Quality of Life Scores:   Quality of Life - 05/24/22 1524       Quality of Life   Select Quality of Life      Quality of Life Scores   Health/Function Pre 23.11 %    Socioeconomic Pre 30 %    Psych/Spiritual Pre 29.14 %    Family Pre 18 %    GLOBAL Pre 25.22 %            Scores of 19 and below usually indicate a poorer quality of life in these areas.  A difference of  2-3 points is a clinically meaningful difference.  A difference of 2-3 points in the total score of the Quality of Life Index has been associated with significant improvement in overall quality of life, self-image, physical symptoms, and general health in studies assessing change in quality of life.  PHQ-9: Review Flowsheet       05/24/2022  Depression screen PHQ 2/9  Decreased Interest 0  Down, Depressed, Hopeless 0  PHQ - 2 Score 0  Altered sleeping 0  Tired, decreased energy 0  Change in appetite 1  Feeling bad or failure about  yourself  0  Trouble concentrating 0  Moving slowly or fidgety/restless 0  Suicidal thoughts 0  PHQ-9 Score 1  Difficult doing work/chores Not difficult at all   Interpretation of Total Score  Total Score Depression Severity:  1-4 = Minimal depression, 5-9 = Mild depression, 10-14 = Moderate depression, 15-19 = Moderately severe depression, 20-27 = Severe depression   Psychosocial Evaluation  and Intervention:  Psychosocial Evaluation - 05/22/22 1121       Psychosocial Evaluation & Interventions   Interventions Encouraged to exercise with the program and follow exercise prescription;Relaxation education;Stress management education    Comments She can look to her daughter and gradaughter for support. She has the typical days where she can feel depressed and stressed but in a "normal".    Expected Outcomes Short: Start [facility] to help with mood. Long: Maintain a healthy mental state    Continue Psychosocial Services  Follow up required by staff             Psychosocial Re-Evaluation:  Psychosocial Re-Evaluation     Row Name 06/12/22 1114 07/10/22 1118 08/07/22 1134         Psychosocial Re-Evaluation   Current issues with None Identified None Identified None Identified     Comments Patient reports no issues with their current mental states, sleep, stress, depression or anxiety. Will follow up with patient in a few weeks for any changes. Patient reports no issues with their current mental states, sleep, stress, depression or anxiety. Will follow up with patient in a few weeks for any changes. Patient reports no issues with their current mental states, sleep, stress, depression or anxiety. Will follow up with patient in a few weeks for any changes.     Expected Outcomes Short: Continue to exercise regularly to support mental health and notify staff of any changes. Long: maintain mental health and well being through teaching of rehab or prescribed medications independently. Short:  Continue to exercise regularly to support mental health and notify staff of any changes. Long: maintain good mental health habits. Short: Continue to exercise regularly to support mental health and notify staff of any changes. Long: maintain good mental health habits.     Interventions Encouraged to attend Cardiac Rehabilitation for the exercise Encouraged to attend Cardiac Rehabilitation for the exercise Encouraged to attend Cardiac Rehabilitation for the exercise     Continue Psychosocial Services  Follow up required by staff Follow up required by staff Follow up required by staff              Psychosocial Discharge (Final Psychosocial Re-Evaluation):  Psychosocial Re-Evaluation - 08/07/22 1134       Psychosocial Re-Evaluation   Current issues with None Identified    Comments Patient reports no issues with their current mental states, sleep, stress, depression or anxiety. Will follow up with patient in a few weeks for any changes.    Expected Outcomes Short: Continue to exercise regularly to support mental health and notify staff of any changes. Long: maintain good mental health habits.    Interventions Encouraged to attend Cardiac Rehabilitation for the exercise    Continue Psychosocial Services  Follow up required by staff             Vocational Rehabilitation: Provide vocational rehab assistance to qualifying candidates.   Vocational Rehab Evaluation & Intervention:   Education: Education Goals: Education classes will be provided on a variety of topics geared toward better understanding of heart health and risk factor modification. Participant will state understanding/return demonstration of topics presented as noted by education test scores.  Learning Barriers/Preferences:  Learning Barriers/Preferences - 05/22/22 1112       Learning Barriers/Preferences   Learning Barriers None    Learning Preferences None             General Cardiac Education  Topics:  AED/CPR: - Group verbal and written instruction with the  use of models to demonstrate the basic use of the AED with the basic ABC's of resuscitation.   Anatomy and Cardiac Procedures: - Group verbal and visual presentation and models provide information about basic cardiac anatomy and function. Reviews the testing methods done to diagnose heart disease and the outcomes of the test results. Describes the treatment choices: Medical Management, Angioplasty, or Coronary Bypass Surgery for treating various heart conditions including Myocardial Infarction, Angina, Valve Disease, and Cardiac Arrhythmias.  Written material given at graduation. Flowsheet Row Cardiac Rehab from 08/02/2022 in [facility] Cardiac and Pulmonary Rehab  Education need identified 05/24/22  Date 06/07/22  Educator SB  Instruction Review Code 1- Verbalizes Understanding       Medication Safety: - Group verbal and visual instruction to review commonly prescribed medications for heart and lung disease. Reviews the medication, class of the drug, and side effects. Includes the steps to properly store meds and maintain the prescription regimen.  Written material given at graduation. Flowsheet Row Cardiac Rehab from 08/02/2022 in [facility] Cardiac and Pulmonary Rehab  Date 06/21/22  Educator MS  Instruction Review Code 1- Verbalizes Understanding       Intimacy: - Group verbal instruction through game format to discuss how heart and lung disease can affect sexual intimacy. Written material given at graduation.. Flowsheet Row Cardiac Rehab from 08/02/2022 in [facility] Cardiac and Pulmonary Rehab  Date 07/26/22  Educator [facility]  Instruction Review Code 1- Verbalizes Understanding       Know Your Numbers and Heart Failure: - Group verbal and visual instruction to discuss disease risk factors for cardiac and pulmonary disease and treatment options.  Reviews associated critical values for Overweight/Obesity, Hypertension, Cholesterol, and  Diabetes.  Discusses basics of heart failure: signs/symptoms and treatments.  Introduces Heart Failure Zone chart for action plan for heart failure.  Written material given at graduation. Flowsheet Row Cardiac Rehab from 08/02/2022 in [facility] Cardiac and Pulmonary Rehab  Date 06/28/22  Educator [facility]  Instruction Review Code 1- Verbalizes Understanding       Infection Prevention: - Provides verbal and written material to individual with discussion of infection control including proper hand washing and proper equipment cleaning during exercise session. Flowsheet Row Cardiac Rehab from 08/02/2022 in [facility] Cardiac and Pulmonary Rehab  Date 05/22/22  Educator [facility]  Instruction Review Code 1- Verbalizes Understanding       Falls Prevention: - Provides verbal and written material to individual with discussion of falls prevention and safety. Flowsheet Row Cardiac Rehab from 08/02/2022 in [facility] Cardiac and Pulmonary Rehab  Date 05/22/22  Educator [facility]  Instruction Review Code 1- Verbalizes Understanding       Other: -Provides group and verbal instruction on various topics (see comments) Flowsheet Row Cardiac Rehab from 08/02/2022 in [facility] Cardiac and Pulmonary Rehab  Date 06/14/22  Educator SB  Instruction Review Code 1- Verbalizes Understanding  [Jeopardy]       Knowledge Questionnaire Score:  Knowledge Questionnaire Score - 05/24/22 1519       Knowledge Questionnaire Score   Pre Score 22/26             Core Components/Risk Factors/Patient Goals at Admission:  Personal Goals and Risk Factors at Admission - 05/24/22 1520       Core Components/Risk Factors/Patient Goals on Admission    Weight Management Yes;Weight Maintenance    Intervention Weight Management: Develop a combined nutrition and exercise program designed to reach desired caloric intake, while maintaining appropriate intake of nutrient and fiber, sodium and fats,  and appropriate energy expenditure required for the weight  goal.;Weight Management: Provide education and appropriate resources to help participant work on and attain dietary goals.;Weight Management/Obesity: Establish reasonable short term and long term weight goals.    Admit Weight 154 lb 3.2 oz (69.9 kg)    Goal Weight: Short Term 150 lb (68 kg)    Goal Weight: Long Term 145 lb (65.8 kg)    Expected Outcomes Short Term: Continue to assess and modify interventions until short term weight is achieved;Long Term: Adherence to nutrition and physical activity/exercise program aimed toward attainment of established weight goal;Weight Maintenance: Understanding of the daily nutrition guidelines, which includes 25-35% calories from fat, 7% or less cal from saturated fats, less than 200mg  cholesterol, less than 1.5gm of sodium, & 5 or more servings of fruits and vegetables daily;Understanding recommendations for meals to include 15-35% energy as protein, 25-35% energy from fat, 35-60% energy from carbohydrates, less than 200mg  of dietary cholesterol, 20-35 gm of total fiber daily;Understanding of distribution of calorie intake throughout the day with the consumption of 4-5 meals/snacks    Hypertension Yes    Intervention Provide education on lifestyle modifcations including regular physical activity/exercise, weight management, moderate sodium restriction and increased consumption of fresh fruit, vegetables, and low fat dairy, alcohol moderation, and smoking cessation.;Monitor prescription use compliance.    Expected Outcomes Short Term: Continued assessment and intervention until BP is < 140/40mm HG in hypertensive participants. < 130/42mm HG in hypertensive participants with diabetes, heart failure or chronic kidney disease.;Long Term: Maintenance of blood pressure at goal levels.    Lipids Yes    Intervention Provide education and support for participant on nutrition & aerobic/resistive exercise along with prescribed medications to achieve LDL 70mg , HDL >40mg .     Expected Outcomes Short Term: Participant states understanding of desired cholesterol values and is compliant with medications prescribed. Participant is following exercise prescription and nutrition guidelines.;Long Term: Cholesterol controlled with medications as prescribed, with individualized exercise RX and with personalized nutrition plan. Value goals: LDL < 70mg , HDL > 40 mg.             Education:Diabetes - Individual verbal and written instruction to review signs/symptoms of diabetes, desired ranges of glucose level fasting, after meals and with exercise. Acknowledge that pre and post exercise glucose checks will be done for 3 sessions at entry of program.   Core Components/Risk Factors/Patient Goals Review:   Goals and Risk Factor Review     Row Name 06/12/22 1115 07/10/22 1115 08/07/22 1135         Core Components/Risk Factors/Patient Goals Review   Personal Goals Review Weight Management/Obesity;Hypertension Weight Management/Obesity;Hypertension;Lipids Weight Management/Obesity;Hypertension;Lipids     Review Edana states that she does not check her blood pressure at home. She is doing well since starting the program with her weight and blood pressure. Her blood pressure was 124/62 today. She has no questions about her medications at this time. Analy states that she has been maintaining her weight around 150 lbs, but would like to be in the 140's. She reports that she is taking all her medication and managing her risk factors with meds, nutrition, and exercise. Berdie reports that  she is taking all her medications and her weight has been steady. She does not have a BP cuff at home and she was encouraged to look into buying on so she can monitor her blood pressure at home.     Expected Outcomes Short: maintain weight.. Long: maintain blood pressure and weight independently.  Short: maintain weight/work on staying in the 140's lbs range. Long: coninue to control cardiac risk  factors. Short: buy a blood pressure cuff. Long: continue to control cardiac risk factors.              Core Components/Risk Factors/Patient Goals at Discharge (Final Review):   Goals and Risk Factor Review - 08/07/22 1135       Core Components/Risk Factors/Patient Goals Review   Personal Goals Review Weight Management/Obesity;Hypertension;Lipids    Review Elizebath reports that  she is taking all her medications and her weight has been steady. She does not have a BP cuff at home and she was encouraged to look into buying on so she can monitor her blood pressure at home.    Expected Outcomes Short: buy a blood pressure cuff. Long: continue to control cardiac risk factors.             ITP Comments:  ITP Comments     Row Name 05/22/22 1111 05/24/22 1516 05/29/22 1132 06/21/22 1232 07/19/22 0812   ITP Comments Virtual Visit completed. Patient informed on EP and RD appointment and 6 Minute walk test. Patient also informed of patient health questionnaires on My Chart. Patient Verbalizes understanding. Visit diagnosis can be found in [facility] 04/03/2022. Completed and gym orientation. Initial ITP created and sent for review to Dr. Bethann Punches, Medical Director. First full day of exercise!  Patient was oriented to gym and equipment including functions, settings, policies, and procedures.  Patient's individual exercise prescription and treatment plan were reviewed.  All starting workloads were established based on the results of the 6 minute walk test done at initial orientation visit.  The plan for exercise progression was also introduced and progression will be customized based on patient's performance and goals. 30 day review completed. ITP sent to Dr. Bethann Punches, Medical Director of Cardiac Rehab. Continue with ITP unless changes are made by physician. 30 Day review completed. Medical Director ITP review done, changes made as directed, and signed approval by Medical Director.    Row Name  08/16/22 0835           ITP Comments 30 day review completed. ITP sent to Dr. Bethann Punches, Medical Director of Cardiac Rehab. Continue with ITP unless changes are made by physician.                Comments: 30 day review

## 2022-08-16 NOTE — Progress Notes (Signed)
Daily Session Note  Patient Details  Name: Lisa Hale MRN: [id number] Date of Birth: 09/05/1945 Referring Provider:   Flowsheet Row Cardiac Rehab from 05/24/2022 in [facility] Cardiac and Pulmonary Rehab  Referring Provider Dr. Lorine Bears       Encounter Date: 08/16/2022  Check In:  Session Check In - 08/16/22 1111       Check-In   Supervising physician immediately available to respond to emergencies See telemetry face sheet for immediately available ER MD    Location [facility]-Cardiac & Pulmonary Rehab    Staff Present Lanny Hurst, RN, ADN;Meredith Jewel Baize, RN BSN;Joseph Hood, RCP,RRT,BSRT;Noah Tickle, BS, Exercise Physiologist    Virtual Visit No    Medication changes reported     No    Fall or balance concerns reported    No    Tobacco Cessation No Change    Warm-up and Cool-down Performed on first and last piece of equipment    Resistance Training Performed Yes    VAD Patient? No    PAD/SET Patient? No      Pain Assessment   Currently in Pain? No/denies                Social History   Tobacco Use  Smoking Status Former   Packs/day: 0.25   Years: 15.00   Additional pack years: 0.00   Total pack years: 3.75   Types: Cigarettes   Quit date: 10/29/2021   Years since quitting: 0.7  Smokeless Tobacco Never  Tobacco Comments   She has been on and off smoking since she was 77 years old.    Goals Met:  Independence with exercise equipment Exercise tolerated well No report of concerns or symptoms today Strength training completed today  Goals Unmet:  Not Applicable  Comments: Pt able to follow exercise prescription today without complaint.  Will continue to monitor for progression.    Dr. Bethann Punches is Medical Director for [facility] Cardiac Rehabilitation.  Dr. Vida Rigger is Medical Director for [facility] Pulmonary Rehabilitation.

## 2022-08-18 DIAGNOSIS — I213 ST elevation (STEMI) myocardial infarction of unspecified site: Secondary | ICD-10-CM | POA: Diagnosis not present

## 2022-08-18 DIAGNOSIS — Z955 Presence of coronary angioplasty implant and graft: Secondary | ICD-10-CM

## 2022-08-18 NOTE — Progress Notes (Signed)
Daily Session Note  Patient Details  Name: Lisa Hale MRN: [id number] Date of Birth: 09-21-1945 Referring Provider:   Flowsheet Row Cardiac Rehab from 05/24/2022 in [facility] Cardiac and Pulmonary Rehab  Referring Provider Dr. Lorine Bears       Encounter Date: 08/18/2022  Check In:  Session Check In - 08/18/22 1127       Check-In   Supervising physician immediately available to respond to emergencies See telemetry face sheet for immediately available ER MD    Location [facility]-Cardiac & Pulmonary Rehab    Staff Present Harlene Ramus, RN, BSN;Joseph Hooven, RCP,RRT,BSRT;Jessica Jenkins, [place], RCEP, CCRP, CCET    Virtual Visit No    Medication changes reported     No    Fall or balance concerns reported    No    Tobacco Cessation No Change    Warm-up and Cool-down Performed on first and last piece of equipment    Resistance Training Performed Yes    VAD Patient? No    PAD/SET Patient? No      Pain Assessment   Currently in Pain? No/denies                Social History   Tobacco Use  Smoking Status Former   Packs/day: 0.25   Years: 15.00   Additional pack years: 0.00   Total pack years: 3.75   Types: Cigarettes   Quit date: 10/29/2021   Years since quitting: 0.8  Smokeless Tobacco Never  Tobacco Comments   She has been on and off smoking since she was 77 years old.    Goals Met:  Independence with exercise equipment Exercise tolerated well No report of concerns or symptoms today Strength training completed today  Goals Unmet:  Not Applicable  Comments: Pt able to follow exercise prescription today without complaint.  Will continue to monitor for progression.   Dr. Bethann Punches is Medical Director for [facility] Cardiac Rehabilitation.  Dr. Vida Rigger is Medical Director for [facility] Pulmonary Rehabilitation.

## 2022-08-21 ENCOUNTER — Encounter: Payer: Medicare Other | Admitting: *Deleted

## 2022-08-21 DIAGNOSIS — I213 ST elevation (STEMI) myocardial infarction of unspecified site: Secondary | ICD-10-CM

## 2022-08-21 DIAGNOSIS — Z955 Presence of coronary angioplasty implant and graft: Secondary | ICD-10-CM

## 2022-08-21 NOTE — Progress Notes (Signed)
Daily Session Note  Patient Details  Name: Lisa Hale MRN: [id number] Date of Birth: [birth year] Referring Provider:   Flowsheet Row Cardiac Rehab from 05/24/2022 in [facility] Cardiac and Pulmonary Rehab  Referring Provider Dr. Lorine Bears       Encounter Date: 08/21/2022  Check In:  Session Check In - 08/21/22 1116       Check-In   Supervising physician immediately available to respond to emergencies See telemetry face sheet for immediately available ER MD    Location [facility]-Cardiac & Pulmonary Rehab    Staff Present Lanny Hurst, RN, Franki Monte, BS, ACSM CEP, Exercise Physiologist;Noah Tickle, BS, Exercise Physiologist    Virtual Visit No    Medication changes reported     No    Fall or balance concerns reported    No    Tobacco Cessation No Change    Warm-up and Cool-down Performed on first and last piece of equipment    Resistance Training Performed Yes    VAD Patient? No    PAD/SET Patient? No      Pain Assessment   Currently in Pain? No/denies                Social History   Tobacco Use  Smoking Status Former   Packs/day: 0.25   Years: 15.00   Additional pack years: 0.00   Total pack years: 3.75   Types: Cigarettes   Quit date: 10/29/2021   Years since quitting: 0.8  Smokeless Tobacco Never  Tobacco Comments   She has been on and off smoking since she was 77 years old.    Goals Met:  Independence with exercise equipment Exercise tolerated well No report of concerns or symptoms today Strength training completed today  Goals Unmet:  Not Applicable  Comments: Pt able to follow exercise prescription today without complaint.  Will continue to monitor for progression.    Dr. Bethann Punches is Medical Director for [facility] Cardiac Rehabilitation.  Dr. Vida Rigger is Medical Director for [facility] Pulmonary Rehabilitation.

## 2022-08-23 ENCOUNTER — Encounter: Payer: Medicare Other | Admitting: *Deleted

## 2022-08-23 DIAGNOSIS — I213 ST elevation (STEMI) myocardial infarction of unspecified site: Secondary | ICD-10-CM

## 2022-08-23 DIAGNOSIS — Z955 Presence of coronary angioplasty implant and graft: Secondary | ICD-10-CM

## 2022-08-23 NOTE — Progress Notes (Signed)
Daily Session Note  Patient Details  Name: Lisa Hale MRN: [id number] Date of Birth: 1945/12/17 Referring Provider:   Flowsheet Row Cardiac Rehab from 05/24/2022 in [facility] Cardiac and Pulmonary Rehab  Referring Provider Dr. Lorine Bears       Encounter Date: 08/23/2022  Check In:  Session Check In - 08/23/22 1111       Check-In   Supervising physician immediately available to respond to emergencies See telemetry face sheet for immediately available ER MD    Location [facility]-Cardiac & Pulmonary Rehab    Staff Present Lanny Hurst, RN, ADN;Joseph Hood, RCP,RRT,BSRT;Noah Tickle, BS, Exercise Physiologist    Virtual Visit No    Medication changes reported     No    Fall or balance concerns reported    No    Tobacco Cessation No Change    Warm-up and Cool-down Performed on first and last piece of equipment    Resistance Training Performed Yes    VAD Patient? No    PAD/SET Patient? No      Pain Assessment   Currently in Pain? No/denies                Social History   Tobacco Use  Smoking Status Former   Packs/day: 0.25   Years: 15.00   Additional pack years: 0.00   Total pack years: 3.75   Types: Cigarettes   Quit date: 10/29/2021   Years since quitting: 0.8  Smokeless Tobacco Never  Tobacco Comments   She has been on and off smoking since she was 77 years old.    Goals Met:  Independence with exercise equipment Exercise tolerated well No report of concerns or symptoms today Strength training completed today  Goals Unmet:  Not Applicable  Comments:  Zanasia graduated today from  rehab with 36 sessions completed.  Details of the patient's exercise prescription and what She needs to do in order to continue the prescription and progress were discussed with patient.  Patient was given a copy of prescription and goals.  Patient verbalized understanding.  Serin plans to continue to exercise by walking, strength training videos, and stretching.     Dr.  Bethann Punches is Medical Director for [facility] Cardiac Rehabilitation.  Dr. Vida Rigger is Medical Director for [facility] Pulmonary Rehabilitation.

## 2022-08-23 NOTE — Progress Notes (Signed)
Discharge Summary: Lisa Hale (DOB: February 25, 1946)  Lisa Hale graduated today from  rehab with 36 sessions completed.  Details of the patient's exercise prescription and what She needs to do in order to continue the prescription and progress were discussed with patient.  Patient was given a copy of prescription and goals.  Patient verbalized understanding.  Lisa Hale plans to continue to exercise by walking, strength training videos, and stretching.   6 Minute Walk     Row Name 05/24/22 1530 08/16/22 1119       6 Minute Walk   Phase Initial Discharge    Distance 975 feet 1035 feet    Distance % Change -- 6.2 %    Distance Feet Change -- 60 ft    Walk Time 6 minutes 6 minutes    # of Rest Breaks 0 0    MPH 1.85 1.96    METS 1.92 1.86    RPE 12 13    Perceived Dyspnea  0 0    VO2 Peak 6.71 6.5    Symptoms No No    Resting HR 58 bpm 68 bpm    Resting BP 114/70 118/66    Resting Oxygen Saturation  98 % 98 %    Exercise Oxygen Saturation  during 6 min walk 96 % 94 %    Max Ex. HR 97 bpm 95 bpm    Max Ex. BP 140/82 126/64    2 Minute Post BP 116/78 --

## 2022-08-23 NOTE — Progress Notes (Signed)
Cardiac Individual Treatment Plan  Patient Details  Name: Lisa Hale MRN: [id number] Date of Birth: 03/30/1946 Referring Provider:   Flowsheet Row Cardiac Rehab from 05/24/2022 in [facility] Cardiac and Pulmonary Rehab  Referring Provider Dr. Lorine Bears       Initial Encounter Date:  Flowsheet Row Cardiac Rehab from 05/24/2022 in [facility] Cardiac and Pulmonary Rehab  Date 05/24/22       Visit Diagnosis: ST elevation myocardial infarction (STEMI), unspecified artery  Status post coronary artery stent placement  Patient's Home Medications on Admission:  Current Outpatient Medications:    albuterol (PROVENTIL HFA;VENTOLIN HFA) 108 (90 Base) MCG/ACT inhaler, Inhale into the lungs every 6 (six) hours as needed for wheezing or shortness of breath., Disp: , Rfl:    aspirin EC 81 MG tablet, Take 1 tablet (81 mg total) by mouth daily. Swallow whole., Disp: 90 tablet, Rfl: 3   carvedilol (COREG) 3.125 MG tablet, Take 1 tablet (3.125 mg total) by mouth 2 (two) times daily with a meal., Disp: 180 tablet, Rfl: 0   cholecalciferol 25 MCG (1000 UT) TABS, Take by mouth daily., Disp: , Rfl:    dextromethorphan-guaiFENesin (ROBITUSSIN-DM) 10-100 MG/5ML liquid, Take 5 mLs by mouth every 4 (four) hours as needed for cough., Disp: , Rfl:    Evolocumab (REPATHA SURECLICK) 140 MG/ML SOAJ, Inject 140 mg into the skin every 14 (fourteen) days., Disp: 2 mL, Rfl: 11   fluticasone (FLOVENT HFA) 220 MCG/ACT inhaler, Inhale into the lungs 2 (two) times daily as needed., Disp: , Rfl:    furosemide (LASIX) 20 MG tablet, Take 1 tablet (20 mg total) by mouth as needed (as needed for increased shortness of breath or weight gain greater than 3 pounds overnight)., Disp: 90 tablet, Rfl: 3   lansoprazole (PREVACID) 15 MG capsule, Take 15 mg by mouth daily. Take around 8- 9am., Disp: , Rfl:    levothyroxine (SYNTHROID) 100 MCG tablet, Take 100 mcg by mouth daily., Disp: , Rfl:    montelukast (SINGULAIR) 10 MG tablet,  Take 10 mg by mouth at bedtime., Disp: , Rfl:    nitroGLYCERIN (NITROSTAT) 0.4 MG SL tablet, Take 1 tablet under the tongue every 5 minutes for up to 3 doses as directed, Disp: 25 tablet, Rfl: 1   phenazopyridine (PYRIDIUM) 200 MG tablet, Take 200 mg by mouth 3 (three) times daily as needed., Disp: , Rfl:    sacubitril-valsartan (ENTRESTO) 24-26 MG, Take 1 tablet by mouth 2 (two) times daily., Disp: 60 tablet, Rfl: 1   spironolactone (ALDACTONE) 25 MG tablet, Take 0.5 tablets (12.5 mg total) by mouth daily., Disp: 45 tablet, Rfl: 3   ticagrelor (BRILINTA) 90 MG TABS tablet, Take 1 tablet (90 mg total) by mouth 2 (two) times daily., Disp: 60 tablet, Rfl: 11  Past Medical History: Past Medical History:  Diagnosis Date   Asthma    CHF (congestive heart failure)    Coronary artery disease    GERD (gastroesophageal reflux disease)    Glaucoma    Hypertension    Hypothyroidism    Scoliosis    Seasonal allergies    Skin cancer    Vertigo    Avg: 1x/mo    Tobacco Use: Social History   Tobacco Use  Smoking Status Former   Packs/day: 0.25   Years: 15.00   Additional pack years: 0.00   Total pack years: 3.75   Types: Cigarettes   Quit date: 10/29/2021   Years since quitting: 0.8  Smokeless Tobacco Never  Tobacco Comments  She has been on and off smoking since she was 77 years old.    Labs: Review Flowsheet       Latest Ref Rng & Units 04/03/2022  Labs for ITP Cardiac and Pulmonary Rehab  Cholestrol 0 - 200 mg/dL 409   LDL (calc) 0 - 99 mg/dL 811   HDL-C >91 mg/dL 57   Trlycerides <478 mg/dL 295   Hemoglobin A2Z 4.8 - 5.6 % 5.7      Exercise Target Goals: Exercise Program Goal: Individual exercise prescription set using results from initial 6 min walk test and THRR while considering  patient's activity barriers and safety.   Exercise Prescription Goal: Initial exercise prescription builds to 30-45 minutes a day of aerobic activity, 2-3 days per week.  Home exercise  guidelines will be given to patient during program as part of exercise prescription that the participant will acknowledge.   Education: Aerobic Exercise: - Group verbal and visual presentation on the components of exercise prescription. Introduces F.I.T.T principle from ACSM for exercise prescriptions.  Reviews F.I.T.T. principles of aerobic exercise including progression. Written material given at graduation. Flowsheet Row Cardiac Rehab from 08/16/2022 in [facility] Cardiac and Pulmonary Rehab  Date 07/26/22  Educator [facility]  Instruction Review Code 1- Verbalizes Understanding       Education: Resistance Exercise: - Group verbal and visual presentation on the components of exercise prescription. Introduces F.I.T.T principle from ACSM for exercise prescriptions  Reviews F.I.T.T. principles of resistance exercise including progression. Written material given at graduation. Flowsheet Row Cardiac Rehab from 08/16/2022 in [facility] Cardiac and Pulmonary Rehab  Date 08/02/22  Educator KW  Instruction Review Code 1- [employer] Understanding        Education: Exercise & Equipment Safety: - Individual verbal instruction and demonstration of equipment use and safety with use of the equipment. Flowsheet Row Cardiac Rehab from 08/16/2022 in [facility] Cardiac and Pulmonary Rehab  Date 05/22/22  Educator [facility]  Instruction Review Code 1- Verbalizes Understanding       Education: Exercise Physiology & General Exercise Guidelines: - Group verbal and written instruction with models to review the exercise physiology of the cardiovascular system and associated critical values. Provides general exercise guidelines with specific guidelines to those with heart or lung disease.  Flowsheet Row Cardiac Rehab from 08/16/2022 in [facility] Cardiac and Pulmonary Rehab  Date 07/19/22  Educator [facility]  Instruction Review Code 1- Verbalizes Understanding       Education: Flexibility, Balance, Mind/Body Relaxation: - Group verbal and  visual presentation with interactive activity on the components of exercise prescription. Introduces F.I.T.T principle from ACSM for exercise prescriptions. Reviews F.I.T.T. principles of flexibility and balance exercise training including progression. Also discusses the mind body connection.  Reviews various relaxation techniques to help reduce and manage stress (i.e. Deep breathing, progressive muscle relaxation, and visualization). Balance handout provided to take home. Written material given at graduation. Flowsheet Row Cardiac Rehab from 08/16/2022 in [facility] Cardiac and Pulmonary Rehab  Date 08/02/22  Educator KW  Instruction Review Code 1- Verbalizes Understanding       Activity Barriers & Risk Stratification:  Activity Barriers & Cardiac Risk Stratification - 05/24/22 1532       Activity Barriers & Cardiac Risk Stratification   Activity Barriers Neck/Spine Problems   Scoliosis, Metal Plate in R leg   Cardiac Risk Stratification High             6 Minute Walk:  6 Minute Walk     Row Name 05/24/22 1530 08/16/22 1119  6 Minute Walk   Phase Initial Discharge    Distance 975 feet 1035 feet    Distance % Change -- 6.2 %    Distance Feet Change -- 60 ft    Walk Time 6 minutes 6 minutes    # of Rest Breaks 0 0    MPH 1.85 1.96    METS 1.92 1.86    RPE 12 13    Perceived Dyspnea  0 0    VO2 Peak 6.71 6.5    Symptoms No No    Resting HR 58 bpm 68 bpm    Resting BP 114/70 118/66    Resting Oxygen Saturation  98 % 98 %    Exercise Oxygen Saturation  during 6 min walk 96 % 94 %    Max Ex. HR 97 bpm 95 bpm    Max Ex. BP 140/82 126/64    2 Minute Post BP 116/78 --             Oxygen Initial Assessment:   Oxygen Re-Evaluation:   Oxygen Discharge (Final Oxygen Re-Evaluation):   Initial Exercise Prescription:  Initial Exercise Prescription - 05/24/22 1500       Date of Initial Exercise RX and Referring Provider   Date 05/24/22    Referring Provider Dr.  Lorine Bears      Oxygen   Maintain Oxygen Saturation 88% or higher      NuStep   Level 2    SPM 80    Minutes 15    METs 1.92      Biostep-RELP   Level 1    SPM 50    Minutes 15    METs 1.92      Track   Laps 22    Minutes 15    METs 2.2      Prescription Details   Frequency (times per week) 2    Duration Progress to 30 minutes of continuous aerobic without signs/symptoms of physical distress      Intensity   THRR 40-80% of Max Heartrate 92-126    Ratings of Perceived Exertion 11-13    Perceived Dyspnea 0-4      Progression   Progression Continue to progress workloads to maintain intensity without signs/symptoms of physical distress.      Resistance Training   Training Prescription Yes    Weight 2 lb    Reps 10-15             Perform Capillary Blood Glucose checks as needed.  Exercise Prescription Changes:   Exercise Prescription Changes     Row Name 05/24/22 1500 06/07/22 1500 06/21/22 1300 07/06/22 1100 07/18/22 1300     Response to Exercise   Blood Pressure (Admit) 114/70 104/58 118/62 142/78 124/64   Blood Pressure (Exercise) 140/82 124/80 126/72 -- --   Blood Pressure (Exit) 116/78 104/66 104/62 126/78 110/68   Heart Rate (Admit) 58 bpm 77 bpm 90 bpm 75 bpm 73 bpm   Heart Rate (Exercise) 97 bpm 107 bpm 116 bpm 102 bpm 113 bpm   Heart Rate (Exit) 56 bpm 86 bpm 79 bpm 81 bpm 73 bpm   Oxygen Saturation (Admit) 98 % -- -- -- --   Oxygen Saturation (Exercise) 96 % -- -- -- --   Rating of Perceived Exertion (Exercise) 12 13 13 13 15    Perceived Dyspnea (Exercise) 0 -- -- -- --   Symptoms None -- none none none   Comments Results First two days of exercise -- -- --  Duration -- Progress to 30 minutes of  aerobic without signs/symptoms of physical distress Continue with 30 min of aerobic exercise without signs/symptoms of physical distress. Continue with 30 min of aerobic exercise without signs/symptoms of physical distress. Continue with 30 min  of aerobic exercise without signs/symptoms of physical distress.   Intensity -- THRR unchanged THRR unchanged THRR unchanged THRR unchanged     Progression   Progression -- Continue to progress workloads to maintain intensity without signs/symptoms of physical distress. Continue to progress workloads to maintain intensity without signs/symptoms of physical distress. Continue to progress workloads to maintain intensity without signs/symptoms of physical distress. Continue to progress workloads to maintain intensity without signs/symptoms of physical distress.   Average METs -- 1.93 2.51 2.77 2.3     Resistance Training   Training Prescription -- Yes Yes Yes Yes   Weight -- 2 lb 2 lb 2 lb 2 lb   Reps -- 10-15 10-15 10-15 10-15     Interval Training   Interval Training -- No No No No     Recumbant Bike   Level -- -- -- 3 --   Watts -- -- -- 25 --   Minutes -- -- -- 15 --   METs -- -- -- 3.13 --     NuStep   Level -- 2 5 5 5    Minutes -- 15 15 15 15    METs -- 2.1 2.7 2 2.9     Recumbant Elliptical   Level -- -- -- -- 1   Minutes -- -- -- -- 15   METs -- -- -- -- 1.3     REL-XR   Level -- -- -- 3 5   Minutes -- -- -- 15 15   METs -- -- -- 2.7 3     Biostep-RELP   Level -- 1 5 5 5    Minutes -- 15 15 15 15    METs -- 2 3 2 2      Track   Laps -- 13 -- -- --   Minutes -- 15 -- -- --   METs -- 1.71 -- -- --     Oxygen   Maintain Oxygen Saturation -- 88% or higher 88% or higher 88% or higher 88% or higher    Row Name 08/03/22 1400 08/16/22 1700           Response to Exercise   Blood Pressure (Admit) 128/62 126/68      Blood Pressure (Exit) 128/64 108/62      Heart Rate (Admit) 74 bpm 68 bpm      Heart Rate (Exercise) 106 bpm 96 bpm      Heart Rate (Exit) 73 bpm 83 bpm      Rating of Perceived Exertion (Exercise) 15 14      Symptoms none none      Duration Continue with 30 min of aerobic exercise without signs/symptoms of physical distress. Continue with 30 min of  aerobic exercise without signs/symptoms of physical distress.      Intensity THRR unchanged THRR unchanged        Progression   Progression Continue to progress workloads to maintain intensity without signs/symptoms of physical distress. Continue to progress workloads to maintain intensity without signs/symptoms of physical distress.      Average METs 1.9 2.26        Resistance Training   Training Prescription Yes Yes      Weight 2 lb 2 lb      Reps 10-15 10-15  Interval Training   Interval Training No No        NuStep   Level 6 --      Minutes 15 --      METs 2.2 --        Recumbant Elliptical   Level 1 3      Minutes 15 15      METs 1.3 1.6        REL-XR   Level 4 6      Minutes 15 15      METs 3.2 2.3        Biostep-RELP   Level 6 --      Minutes 15 --      METs 2 --        Oxygen   Maintain Oxygen Saturation 88% or higher 88% or higher               Exercise Comments:   Exercise Comments     Row Name 05/29/22 1132           Exercise Comments First full day of exercise!  Patient was oriented to gym and equipment including functions, settings, policies, and procedures.  Patient's individual exercise prescription and treatment plan were reviewed.  All starting workloads were established based on the results of the 6 minute walk test done at initial orientation visit.  The plan for exercise progression was also introduced and progression will be customized based on patient's performance and goals.                Exercise Goals and Review:   Exercise Goals     Row Name 05/24/22 1533             Exercise Goals   Increase Physical Activity Yes       Intervention Provide advice, education, support and counseling about physical activity/exercise needs.;Develop an individualized exercise prescription for aerobic and resistive training based on initial evaluation findings, risk stratification, comorbidities and participant's personal goals.        Expected Outcomes Short Term: Attend rehab on a regular basis to increase amount of physical activity.;Long Term: Add in home exercise to make exercise part of routine and to increase amount of physical activity.;Long Term: Exercising regularly at least 3-5 days a week.       Increase Strength and Stamina Yes       Intervention Provide advice, education, support and counseling about physical activity/exercise needs.;Develop an individualized exercise prescription for aerobic and resistive training based on initial evaluation findings, risk stratification, comorbidities and participant's personal goals.       Expected Outcomes Short Term: Increase workloads from initial exercise prescription for resistance, speed, and METs.;Short Term: Perform resistance training exercises routinely during rehab and add in resistance training at home;Long Term: Improve cardiorespiratory fitness, muscular endurance and strength as measured by increased METs and functional capacity ( )       Able to understand and use rate of perceived exertion (RPE) scale Yes       Intervention Provide education and explanation on how to use RPE scale       Expected Outcomes Short Term: Able to use RPE daily in rehab to express subjective intensity level;Long Term:  Able to use RPE to guide intensity level when exercising independently       Able to understand and use Dyspnea scale Yes       Intervention Provide education and explanation on how to use Dyspnea scale  Expected Outcomes Short Term: Able to use Dyspnea scale daily in rehab to express subjective sense of shortness of breath during exertion;Long Term: Able to use Dyspnea scale to guide intensity level when exercising independently       Knowledge and understanding of Target Heart Rate Range (THRR) Yes       Intervention Provide education and explanation of THRR including how the numbers were predicted and where they are located for reference       Expected Outcomes  Long Term: Able to use THRR to govern intensity when exercising independently;Short Term: Able to use daily as guideline for intensity in rehab;Short Term: Able to state/look up THRR       Able to check pulse independently Yes       Intervention Provide education and demonstration on how to check pulse in carotid and radial arteries.;Review the importance of being able to check your own pulse for safety during independent exercise       Expected Outcomes Short Term: Able to explain why pulse checking is important during independent exercise;Long Term: Able to check pulse independently and accurately       Understanding of Exercise Prescription Yes       Intervention Provide education, explanation, and written materials on patient's individual exercise prescription       Expected Outcomes Short Term: Able to explain program exercise prescription;Long Term: Able to explain home exercise prescription to exercise independently                Exercise Goals Re-Evaluation :  Exercise Goals Re-Evaluation     Row Name 05/29/22 1132 06/07/22 1523 06/21/22 1302 07/06/22 1103 07/18/22 1401     Exercise Goal Re-Evaluation   Exercise Goals Review Increase Strength and Stamina;Able to understand and use rate of perceived exertion (RPE) scale;Able to understand and use Dyspnea scale;Understanding of Exercise Prescription Increase Strength and Stamina;Understanding of Exercise Prescription;Increase Physical Activity Increase Strength and Stamina;Understanding of Exercise Prescription;Increase Physical Activity Increase Strength and Stamina;Understanding of Exercise Prescription;Increase Physical Activity Increase Strength and Stamina;Understanding of Exercise Prescription;Increase Physical Activity   Comments Reviewed RPE scale, THR and program prescription with pt today.  Pt voiced understanding and was given a copy of goals to take home. Lisa Hale is off to a good start in the program. She had an average MET  level of 1.93 METs during her first two sessions of exercise. She was also able to walk up to 13 laps in the track. She was also able to tolerate level 2 on the T4 Nustep and level 1 on the Biostep. We will continue to monitor his progress in the program. Lisa Hale is doing well in rehab. Her overall METs averaged around 2.5. She has only worked on the [employer] and [employer] as she declines to walk at rehab as she states she can do that at home. Staff has already discussed with patient. She did, however, ncrease to level 5 on both of her seated machines! She is still hitting her THR as well. We will continue to monitor, Lisa Hale is doing well in rehab. Her overall average METs increased to 2.77. She has continued to only do seated machines as she states she can walk at home. However, she did well with her first time on the XR at level 3 and the recumbent bike at level 3 also. We will continue to monitor her progress in the program. Lisa Hale continues to do well in rehab, She did increase to level 5 on the XR, which  she continues to stay at on her other seated machines as well. She is still not incorportating walking in her regimen. She could benefit from increasing to 3 lb for handweights. Will continue to monitor.   Expected Outcomes Short: Use RPE daily to regulate intensity. Long: Follow program prescription in THR. Short: Continue to follow current exercise prescription. Long: Continue to attend rehab. Short: Try out other seated machines to mix up exercise routine Long: Continue to increase overall MET level and stamina Short: Continue to progressively increase workloads. Long: Continue to improve strength and stamina. Short: Increase to 3 lb handweights Long: Continue to increase overall MET level and stamina    Row Name 08/03/22 1446 08/07/22 1118 08/16/22 1758         Exercise Goal Re-Evaluation   Exercise Goals Review Increase Strength and Stamina;Understanding of Exercise Prescription;Increase Physical  Activity Increase Strength and Stamina;Understanding of Exercise Prescription;Increase Physical Activity Increase Strength and Stamina;Understanding of Exercise Prescription;Increase Physical Activity     Comments Lisa Hale continues to do well in the program. She did improve to level 6 on both the T4 nustep and the biostep, while staying consistent on the recument elliptical and XR. She still has not done any walking while at rehab becuase she states "she can walk at home". She also has continued to use 2 lb hand weights for resistance training. We will continue to monitor her progress. Lisa Hale consistently comes to cardiac rehab consistently. She is walking at home 4-5 days a week for 30 min as well. She feels like she is continually gaining strength and states that she is able to do more at home then before. She has not yet moved up to 3 pound weights but plans to try them in class today. Lisa Hale continues to do well in rehab. She improved on her post and improved by 6%! She also increased to level 3 on the REL. She is going to graduate soon and we will continue to monitor until then.     Expected Outcomes Short: Increase to 3 lb handweights. Long: Continue to improve strength and stamina. Short: Increase to 3 lb handweights. Long: Continue to improve strength and stamina. Short: Graduate Long: Continue to increase overall MET level and stamina              Discharge Exercise Prescription (Final Exercise Prescription Changes):  Exercise Prescription Changes - 08/16/22 1700       Response to Exercise   Blood Pressure (Admit) 126/68    Blood Pressure (Exit) 108/62    Heart Rate (Admit) 68 bpm    Heart Rate (Exercise) 96 bpm    Heart Rate (Exit) 83 bpm    Rating of Perceived Exertion (Exercise) 14    Symptoms none    Duration Continue with 30 min of aerobic exercise without signs/symptoms of physical distress.    Intensity THRR unchanged      Progression   Progression Continue to progress  workloads to maintain intensity without signs/symptoms of physical distress.    Average METs 2.26      Resistance Training   Training Prescription Yes    Weight 2 lb    Reps 10-15      Interval Training   Interval Training No      Recumbant Elliptical   Level 3    Minutes 15    METs 1.6      REL-XR   Level 6    Minutes 15    METs 2.3  Oxygen   Maintain Oxygen Saturation 88% or higher             Nutrition:  Target Goals: Understanding of nutrition guidelines, daily intake of sodium 1500mg , cholesterol 200mg , calories 30% from fat and 7% or less from saturated fats, daily to have 5 or more servings of fruits and vegetables.  Education: All About Nutrition: -Group instruction provided by verbal, written material, interactive activities, discussions, models, and posters to present general guidelines for heart healthy nutrition including fat, fiber, MyPlate, the role of sodium in heart healthy nutrition, utilization of the nutrition label, and utilization of this knowledge for meal planning. Follow up email sent as well. Written material given at graduation. Flowsheet Row Cardiac Rehab from 08/16/2022 in [facility] Cardiac and Pulmonary Rehab  Education need identified 05/24/22  Date 08/16/22  [part 2]  Educator [facility]  Instruction Review Code 1- Verbalizes Understanding       Biometrics:  Pre Biometrics - 05/24/22 1534       Pre Biometrics   Height  (1.6 m)    Weight 154 lb 3.2 oz (69.9 kg)    Waist Circumference 41 inches    Hip Circumference 41 inches    Waist to Hip Ratio 1 %    BMI (Calculated) 27.32    Single Leg Stand 30 seconds   L            Post Biometrics - 08/16/22 1123        Post  Biometrics   Height  (1.6 m)    Weight 159 lb 3.2 oz (72.2 kg)    Waist Circumference 39 inches    Hip Circumference 38.5 inches    Waist to Hip Ratio 1.01 %    BMI (Calculated) 28.21    Single Leg Stand 30 seconds             Nutrition Therapy  Plan and Nutrition Goals:  Nutrition Therapy & Goals - 06/26/22 1157       Nutrition Therapy   Diet Heart healthy, low Na    Protein (specify units) 80-85g    Fiber 25 grams    Whole Grain Foods 3 servings    Saturated Fats 12 max. grams    Fruits and Vegetables 8 servings/day    Sodium 1.5 grams      Personal Nutrition Goals   Nutrition Goal ST: include microwave whole grains, pre-prepared chicken, and frozen fruits and vegetables to her meals when they sound good to improve nutrition while her appetite is lower and her preferences have changed. Once appetite improves limiting satured fat to <12g/day LT: meet protein/energy needs, maintain weight, limit Na <1.5g/day, include 25g of fiber/day, limit saturated fat <12g/day    Comments 77 y.o. F admitted to cardiac rehab s/p STEMI. PMHx includes CAD, HFrEF, HTN, HLD, GERD, hypothyroidism, scoliosis, previous tobacco use, diverticulosis. Reviewed relevant medications furosemide, synthroid, cholecalciferol. Most recent labs reviewed. Uma cancelled her nutrition appointment as she was nervous it would be boring or difficult to understand due to many terms such as the different types of fat and cholesterol; spoke to her during exercise. Discussed nutrition in more broad terms focusing on types of food to choose as well as considerations for her specific conditions and concerns. 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 She reports using premier protein and eating ice cream as her main source of nutrition, but she feels she has been including more types of food as she used to and feels her appetite has been improving. Lisa Hale also  reports not cooking; she usually goes out to eat and will make easy to put together meals at home. Discussed heart healthy eay to prep meals like frozen/packaged microwave whole grains, frozen and pre-cut fruits/vegetables, beans/lentils, nuts/seeds and nut/seed butter, and rotisserie chicken (keeping in mind sodium content) for easy meals.      Intervention Plan   Intervention Prescribe, educate and counsel regarding individualized specific dietary modifications aiming towards targeted core components such as weight, hypertension, lipid management, diabetes, heart failure and other comorbidities.    Expected Outcomes Short Term Goal: Understand basic principles of dietary content, such as calories, fat, sodium, cholesterol and nutrients.;Short Term Goal: A plan has been developed with personal nutrition goals set during dietitian appointment.;Long Term Goal: Adherence to prescribed nutrition plan.             Nutrition Assessments:  MEDIFICTS Score Key: ?70 Need to make dietary changes  40-70 Heart Healthy Diet ? 40 Therapeutic Level Cholesterol Diet  Flowsheet Row Cardiac Rehab from 08/21/2022 in [facility] Cardiac and Pulmonary Rehab  Picture Your Plate Total Score on Discharge 50      Picture Your Plate Scores: <91 Unhealthy dietary pattern with much room for improvement. 41-50 Dietary pattern unlikely to meet recommendations for good health and room for improvement. 51-60 More healthful dietary pattern, with some room for improvement.  >60 Healthy dietary pattern, although there may be some specific behaviors that could be improved.    Nutrition Goals Re-Evaluation:  Nutrition Goals Re-Evaluation     Row Name 06/12/22 1111 08/07/22 1138           Goals   Current Weight 151 lb (68.5 kg) --      Nutrition Goal Meet with the dietitian. ST: include microwave whole grains, pre-prepared chicken, and frozen fruits and vegetables to her meals when they sound good to improve nutrition  while her appetite is lower and her preferences have changed. Once appetite improves limiting satured fat to <12g/day LT: meet protein/energy needs, maintain weight, limit Na <1.5g/day, include 25g of fiber/day, limit saturated fat <12g/day      Comment Lisa Hale would like to meet with the dietitian Lisa Hale did get a change to meet with dietian and is trying to implement some of the recommendations. Her tastes have changed since her heart event, which has made it a little more difficult to eat certain foods. She eats out a lot because she does not like to cook , but does try to watch her sodium intake.      Expected Outcome Short: meet with dietitian. Long: adhere to a diet that pertains to her. Short: try cooking at home a little more Long: maintain heart healthy eating plan.               Nutrition Goals Discharge (Final Nutrition Goals Re-Evaluation):  Nutrition Goals Re-Evaluation - 08/07/22 1138       Goals   Nutrition Goal ST: include microwave whole grains, pre-prepared chicken, and frozen fruits and vegetables to her meals when they sound good to improve nutrition while her appetite is lower and her preferences have changed. Once appetite improves limiting satured fat  to <12g/day LT: meet protein/energy needs, maintain weight, limit Na <1.5g/day, include 25g of fiber/day, limit saturated fat <12g/day    Comment Lisa Hale did get a change to meet with dietian and is trying to implement some of the recommendations. Her tastes have changed since her heart event, which has made it a little more difficult to eat certain foods. She eats out a lot because she does not like to cook , but does try to watch her sodium intake.    Expected Outcome Short: try cooking at home a little more Long: maintain heart healthy eating plan.             Psychosocial: Target Goals: Acknowledge presence or absence of significant depression and/or stress, maximize coping skills, provide positive support system.  Participant is able to verbalize types and ability to use techniques and skills needed for reducing stress and depression.   Education: Stress, Anxiety, and Depression - Group verbal and visual presentation to define topics covered.  Reviews how body is impacted by stress, anxiety, and depression.  Also discusses healthy ways to reduce stress and to treat/manage anxiety and depression.  Written material given at graduation. Flowsheet Row Cardiac Rehab from 08/16/2022 in [facility] Cardiac and Pulmonary Rehab  Date 07/12/22  Educator [facility]  Instruction Review Code 1- [employer] Understanding       Education: Sleep Hygiene -Provides group verbal and written instruction about how sleep can affect your health.  Define sleep hygiene, discuss sleep cycles and impact of sleep habits. Review good sleep hygiene tips.    Initial Review & Psychosocial Screening:  Initial Psych Review & Screening - 05/22/22 1119       Initial Review   Current issues with None Identified      Family Dynamics   Good Support System? Yes    Comments She can look to her daughter and gradaughter for support. She has the typical days where she can feel depressed and stressed but in a "normal".      Barriers   Psychosocial barriers to participate in program There are no identifiable barriers or psychosocial needs.      Screening Interventions   Interventions Encouraged to exercise;To provide support and resources with identified psychosocial needs;Provide feedback about the scores to participant    Expected Outcomes Short Term goal: Utilizing psychosocial counselor, staff and physician to assist with identification of specific Stressors or current issues interfering with healing process. Setting desired goal for each stressor or current issue identified.;Long Term Goal: Stressors or current issues are controlled or eliminated.;Long Term goal: The participant improves quality of Life and PHQ9 Scores as seen by post scores and/or  verbalization of changes;Short Term goal: Identification and review with participant of any Quality of Life or Depression concerns found by scoring the questionnaire.             Quality of Life Scores:   Quality of Life - 08/21/22 1134       Quality of Life Scores   Health/Function Pre 23.11 %    Health/Function Post 29.08 %    Health/Function % Change 25.83 %    Socioeconomic Pre 30 %    Socioeconomic Post 30 %    Socioeconomic % Change  0 %    Psych/Spiritual Pre 29.14 %    Psych/Spiritual Post 30 %    Psych/Spiritual % Change 2.95 %    Family Pre 18 %    Family Post 25.5 %    Family % Change 41.67 %  GLOBAL Pre 25.22 %    GLOBAL Post 28.97 %    GLOBAL % Change 14.87 %            Scores of 19 and below usually indicate a poorer quality of life in these areas.  A difference of  2-3 points is a clinically meaningful difference.  A difference of 2-3 points in the total score of the Quality of Life Index has been associated with significant improvement in overall quality of life, self-image, physical symptoms, and general health in studies assessing change in quality of life.  PHQ-9: Review Flowsheet       08/21/2022 05/24/2022  Depression screen PHQ 2/9  Decreased Interest 0 0  Down, Depressed, Hopeless 0 0  PHQ - 2 Score 0 0  Altered sleeping 1 0  Tired, decreased energy 1 0  Change in appetite 1 1  Feeling bad or failure about yourself  0 0  Trouble concentrating 0 0  Moving slowly or fidgety/restless 0 0  Suicidal thoughts 0 0  PHQ-9 Score 3 1  Difficult doing work/chores Not difficult at all Not difficult at all   Interpretation of Total Score  Total Score Depression Severity:  1-4 = Minimal depression, 5-9 = Mild depression, 10-14 = Moderate depression, 15-19 = Moderately severe depression, 20-27 = Severe depression   Psychosocial Evaluation and Intervention:  Psychosocial Evaluation - 05/22/22 1121       Psychosocial Evaluation & Interventions    Interventions Encouraged to exercise with the program and follow exercise prescription;Relaxation education;Stress management education    Comments She can look to her daughter and gradaughter for support. She has the typical days where she can feel depressed and stressed but in a "normal".    Expected Outcomes Short: Start [facility] to help with mood. Long: Maintain a healthy mental state    Continue Psychosocial Services  Follow up required by staff             Psychosocial Re-Evaluation:  Psychosocial Re-Evaluation     Row Name 06/12/22 1114 07/10/22 1118 08/07/22 1134         Psychosocial Re-Evaluation   Current issues with None Identified None Identified None Identified     Comments Patient reports no issues with their current mental states, sleep, stress, depression or anxiety. Will follow up with patient in a few weeks for any changes. Patient reports no issues with their current mental states, sleep, stress, depression or anxiety. Will follow up with patient in a few weeks for any changes. Patient reports no issues with their current mental states, sleep, stress, depression or anxiety. Will follow up with patient in a few weeks for any changes.     Expected Outcomes Short: Continue to exercise regularly to support mental health and notify staff of any changes. Long: maintain mental health and well being through teaching of rehab or prescribed medications independently. Short: Continue to exercise regularly to support mental health and notify staff of any changes. Long: maintain good mental health habits. Short: Continue to exercise regularly to support mental health and notify staff of any changes. Long: maintain good mental health habits.     Interventions Encouraged to attend Cardiac Rehabilitation for the exercise Encouraged to attend Cardiac Rehabilitation for the exercise Encouraged to attend Cardiac Rehabilitation for the exercise     Continue Psychosocial Services  Follow up  required by staff Follow up required by staff Follow up required by staff  Psychosocial Discharge (Final Psychosocial Re-Evaluation):  Psychosocial Re-Evaluation - 08/07/22 1134       Psychosocial Re-Evaluation   Current issues with None Identified    Comments Patient reports no issues with their current mental states, sleep, stress, depression or anxiety. Will follow up with patient in a few weeks for any changes.    Expected Outcomes Short: Continue to exercise regularly to support mental health and notify staff of any changes. Long: maintain good mental health habits.    Interventions Encouraged to attend Cardiac Rehabilitation for the exercise    Continue Psychosocial Services  Follow up required by staff             Vocational Rehabilitation: Provide vocational rehab assistance to qualifying candidates.   Vocational Rehab Evaluation & Intervention:   Education: Education Goals: Education classes will be provided on a variety of topics geared toward better understanding of heart health and risk factor modification. Participant will state understanding/return demonstration of topics presented as noted by education test scores.  Learning Barriers/Preferences:  Learning Barriers/Preferences - 05/22/22 1112       Learning Barriers/Preferences   Learning Barriers None    Learning Preferences None             General Cardiac Education Topics:  AED/CPR: - Group verbal and written instruction with the use of models to demonstrate the basic use of the AED with the basic ABC's of resuscitation.   Anatomy and Cardiac Procedures: - Group verbal and visual presentation and models provide information about basic cardiac anatomy and function. Reviews the testing methods done to diagnose heart disease and the outcomes of the test results. Describes the treatment choices: Medical Management, Angioplasty, or Coronary Bypass Surgery for treating various heart  conditions including Myocardial Infarction, Angina, Valve Disease, and Cardiac Arrhythmias.  Written material given at graduation. Flowsheet Row Cardiac Rehab from 08/16/2022 in [facility] Cardiac and Pulmonary Rehab  Education need identified 05/24/22  Date 06/07/22  Educator SB  Instruction Review Code 1- Verbalizes Understanding       Medication Safety: - Group verbal and visual instruction to review commonly prescribed medications for heart and lung disease. Reviews the medication, class of the drug, and side effects. Includes the steps to properly store meds and maintain the prescription regimen.  Written material given at graduation. Flowsheet Row Cardiac Rehab from 08/16/2022 in [facility] Cardiac and Pulmonary Rehab  Date 06/21/22  Educator MS  Instruction Review Code 1- Verbalizes Understanding       Intimacy: - Group verbal instruction through game format to discuss how heart and lung disease can affect sexual intimacy. Written material given at graduation.. Flowsheet Row Cardiac Rehab from 08/16/2022 in [facility] Cardiac and Pulmonary Rehab  Date 07/26/22  Educator [facility]  Instruction Review Code 1- Verbalizes Understanding       Know Your Numbers and Heart Failure: - Group verbal and visual instruction to discuss disease risk factors for cardiac and pulmonary disease and treatment options.  Reviews associated critical values for Overweight/Obesity, Hypertension, Cholesterol, and Diabetes.  Discusses basics of heart failure: signs/symptoms and treatments.  Introduces Heart Failure Zone chart for action plan for heart failure.  Written material given at graduation. Flowsheet Row Cardiac Rehab from 08/16/2022 in [facility] Cardiac and Pulmonary Rehab  Date 06/28/22  Educator [facility]  Instruction Review Code 1- Verbalizes Understanding       Infection Prevention: - Provides verbal and written material to individual with discussion of infection control including proper hand washing and proper equipment  cleaning during exercise session. Flowsheet Row Cardiac Rehab from 08/16/2022 in [facility] Cardiac and Pulmonary Rehab  Date 05/22/22  Educator [facility]  Instruction Review Code 1- Verbalizes Understanding       Falls Prevention: - Provides verbal and written material to individual with discussion of falls prevention and safety. Flowsheet Row Cardiac Rehab from 08/16/2022 in [facility] Cardiac and Pulmonary Rehab  Date 05/22/22  Educator [facility]  Instruction Review Code 1- Verbalizes Understanding       Other: -Provides group and verbal instruction on various topics (see comments) Flowsheet Row Cardiac Rehab from 08/16/2022 in [facility] Cardiac and Pulmonary Rehab  Date 06/14/22  Educator SB  Instruction Review Code 1- Verbalizes Understanding  [Jeopardy]       Knowledge Questionnaire Score:  Knowledge Questionnaire Score - 08/21/22 1134       Knowledge Questionnaire Score   Pre Score 22/26    Post Score 22/26             Core Components/Risk Factors/Patient Goals at Admission:  Personal Goals and Risk Factors at Admission - 05/24/22 1520       Core Components/Risk Factors/Patient Goals on Admission    Weight Management Yes;Weight Maintenance    Intervention Weight Management: Develop a combined nutrition and exercise program designed to reach desired caloric intake, while maintaining appropriate intake of nutrient and fiber, sodium and fats, and appropriate energy expenditure required for the weight goal.;Weight Management: Provide education and appropriate resources to help participant work on and attain dietary goals.;Weight Management/Obesity: Establish reasonable short term and long term weight goals.    Admit Weight 154 lb 3.2 oz (69.9 kg)    Goal Weight: Short Term 150 lb (68 kg)    Goal Weight: Long Term 145 lb (65.8 kg)    Expected Outcomes Short Term: Continue to assess and modify interventions until short term weight is achieved;Long Term: Adherence to nutrition and physical  activity/exercise program aimed toward attainment of established weight goal;Weight Maintenance: Understanding of the daily nutrition guidelines, which includes 25-35% calories from fat, 7% or less cal from saturated fats, less than 200mg  cholesterol, less than 1.5gm of sodium, & 5 or more servings of fruits and vegetables daily;Understanding recommendations for meals to include 15-35% energy as protein, 25-35% energy from fat, 35-60% energy from carbohydrates, less than 200mg  of dietary cholesterol, 20-35 gm of total fiber daily;Understanding of distribution of calorie intake throughout the day with the consumption of 4-5 meals/snacks    Hypertension Yes    Intervention Provide education on lifestyle modifcations including regular physical activity/exercise, weight management, moderate sodium restriction and increased consumption of fresh fruit, vegetables, and low fat dairy, alcohol moderation, and smoking cessation.;Monitor prescription use compliance.    Expected Outcomes Short Term: Continued assessment and intervention until BP is < 140/72mm HG in hypertensive participants. < 130/60mm HG in hypertensive participants with diabetes, heart failure or chronic kidney disease.;Long Term: Maintenance of blood pressure at goal levels.    Lipids Yes    Intervention Provide education and support for participant on nutrition & aerobic/resistive exercise along with prescribed medications to achieve LDL 70mg , HDL >40mg .    Expected Outcomes Short Term: Participant states understanding of desired cholesterol values and is compliant with medications prescribed. Participant is following exercise prescription and nutrition guidelines.;Long Term: Cholesterol controlled with medications as prescribed, with individualized exercise RX and with personalized nutrition plan. Value goals: LDL < 70mg , HDL > 40 mg.             Education:Diabetes -  Individual verbal and written instruction to review signs/symptoms of  diabetes, desired ranges of glucose level fasting, after meals and with exercise. Acknowledge that pre and post exercise glucose checks will be done for 3 sessions at entry of program.   Core Components/Risk Factors/Patient Goals Review:   Goals and Risk Factor Review     Row Name 06/12/22 1115 07/10/22 1115 08/07/22 1135         Core Components/Risk Factors/Patient Goals Review   Personal Goals Review Weight Management/Obesity;Hypertension Weight Management/Obesity;Hypertension;Lipids Weight Management/Obesity;Hypertension;Lipids     Review Marvie states that she does not check her blood pressure at home. She is doing well since starting the program with her weight and blood pressure. Her blood pressure was 124/62 today. She has no questions about her medications at this time. Lisa Hale states that she has been maintaining her weight around 150 lbs, but would like to be in the 140's. She reports that she is taking all her medication and managing her risk factors with meds, nutrition, and exercise. Lisa Hale reports that  she is taking all her medications and her weight has been steady. She does not have a BP cuff at home and she was encouraged to look into buying on so she can monitor her blood pressure at home.     Expected Outcomes Short: maintain weight.. Long: maintain blood pressure and weight independently. Short: maintain weight/work on staying in the 140's lbs range. Long: coninue to control cardiac risk factors. Short: buy a blood pressure cuff. Long: continue to control cardiac risk factors.              Core Components/Risk Factors/Patient Goals at Discharge (Final Review):   Goals and Risk Factor Review - 08/07/22 1135       Core Components/Risk Factors/Patient Goals Review   Personal Goals Review Weight Management/Obesity;Hypertension;Lipids    Review Lisa Hale reports that  she is taking all her medications and her weight has been steady. She does not have a BP cuff at home and she  was encouraged to look into buying on so she can monitor her blood pressure at home.    Expected Outcomes Short: buy a blood pressure cuff. Long: continue to control cardiac risk factors.             ITP Comments:  ITP Comments     Row Name 05/22/22 1111 05/24/22 1516 05/29/22 1132 06/21/22 1232 07/19/22 0812   ITP Comments Virtual Visit completed. Patient informed on EP and RD appointment and 6 Minute walk test. Patient also informed of patient health questionnaires on My Chart. Patient Verbalizes understanding. Visit diagnosis can be found in [facility] 04/03/2022. Completed and gym orientation. Initial ITP created and sent for review to Dr. Bethann Punches, Medical Director. First full day of exercise!  Patient was oriented to gym and equipment including functions, settings, policies, and procedures.  Patient's individual exercise prescription and treatment plan were reviewed.  All starting workloads were established based on the results of the 6 minute walk test done at initial orientation visit.  The plan for exercise progression was also introduced and progression will be customized based on patient's performance and goals. 30 day review completed. ITP sent to Dr. Bethann Punches, Medical Director of Cardiac Rehab. Continue with ITP unless changes are made by physician. 30 Day review completed. Medical Director ITP review done, changes made as directed, and signed approval by Medical Director.    Row Name 08/16/22 [phone] 08/23/22 1116  ITP Comments 30 day review completed. ITP sent to Dr. Bethann Punches, Medical Director of Cardiac Rehab. Continue with ITP unless changes are made by physician. Neelie graduated today from  rehab with 36 sessions completed.  Details of the patient's exercise prescription and what She needs to do in order to continue the prescription and progress were discussed with patient.  Patient was given a copy of prescription and goals.  Patient verbalized understanding.  Lisa Hale  plans to continue to exercise by walking, strength training videos, and stretching.               Comments: Discharge ITP

## 2022-09-25 ENCOUNTER — Other Ambulatory Visit: Payer: Self-pay | Admitting: Physician Assistant

## 2022-09-26 ENCOUNTER — Other Ambulatory Visit: Payer: Self-pay | Admitting: Physician Assistant

## 2022-09-27 ENCOUNTER — Other Ambulatory Visit: Payer: Self-pay | Admitting: Physician Assistant

## 2022-10-27 ENCOUNTER — Telehealth: Payer: Self-pay | Admitting: Physician Assistant

## 2022-10-27 NOTE — Telephone Encounter (Signed)
Patient daughter calling to verify if MRI of brain is still needed. Ordered in February at last Office visit. Patient daughter reports her mothers symptoms have improved/resolved but she does have difficulty finding her words occasionally. Patients daughter mentioned that they will need to cancel the MRI anyway's on July 5th due to a scheduling conflict but would like to know if they should rescheduled.

## 2022-10-27 NOTE — Telephone Encounter (Signed)
Pt's daughter is requesting a callback regarding upcoming MRI, Joni Reining at [phone].  Please advise.

## 2022-10-27 NOTE — Telephone Encounter (Signed)
I am glad her symptoms have improved/resolved.  In this setting, I believe it would be okay to cancel the MRI as it is unlikely to change management.  If symptoms return, we can revisit this.

## 2022-10-30 NOTE — Telephone Encounter (Signed)
Spoke with patients daughter and reviewed provider reply. She verbalized understanding and stated that they canceled testing and will revisit this when she comes in for appointment. No further needs at this time.

## 2022-11-03 ENCOUNTER — Ambulatory Visit: Payer: Medicare Other

## 2022-11-10 ENCOUNTER — Other Ambulatory Visit: Payer: Self-pay | Admitting: Emergency Medicine

## 2022-11-10 MED ORDER — CARVEDILOL 3.125 MG PO TABS: 3.1250 mg | ORAL_TABLET | Freq: Two times a day (BID) | ORAL | 3 refills | Status: DC

## 2022-11-14 NOTE — Progress Notes (Signed)
Cardiology Office Note    Date:  11/17/2022   ID:  Lisa Hale, Lisa Hale 15-Apr-1946, MRN [id number]  PCP:  Dortha Kern, MD  Cardiologist:  Lorine Bears, MD  Electrophysiologist:  None   Chief Complaint: Follow up  History of Present Illness:   Lisa Hale is a 77 y.o. female with history of CAD with anterior ST elevation MI in 03/2022 status post PCI/DES to the LAD, HFrEF secondary to ICM, HTN, HLD, hypothyroidism, scoliosis, prior tobacco use quitting in 1999, and GERD who presents for follow-up of CAD and cardiomyopathy.   She was admitted to the hospital from 12/4 through 04/05/2022 with an anterior ST elevation MI.  Leading up to her admission, she reported substernal chest pain and tightness that radiated to the bilateral arms the day prior.  She was initially evaluated at an urgent care where EKG showed an anterior STEMI with subsequent code STEMI activation and transport to the ED via EMS.  Emergent LHC on 04/03/2022 showed an occluded mid LAD.  In addition, there was severe stenosis in the proximal RCA.  There was moderately reduced LV systolic function with an EF of 35% with akinesis of the mid to distal anterior wall, apex, and distal inferior wall.  Moderately elevated LVEDP of 27 mmHg.  She underwent successful PCI/DES to the mid LAD and successful OCT-guided PCI/DES to the RCA.  Echo during the admission demonstrated an EF of 25 to 30%, akinesis of the mid and distal anterior septum, apical lateral segment, mid inferoseptal segment, apical anterior segment, and apical inferior segment with hypokinesis of the mid inferolateral, mid anterolateral, mid anterior, and mid inferior segments.  There was also grade 1 diastolic dysfunction, mildly reduced RV systolic function with akinesis of the apex, moderately enlarged RV cavity size, mild mitral regurgitation, and aortic valve sclerosis with mild stenosis.  High-sensitivity troponin peaked at greater than 24,000.   LifeVest was placed prior to discharge, however this had to be discontinued secondary to blisters noted at skin contact points.  She was seen in hospital follow-up on 05/02/2022 and was without symptoms of angina or decompensation.  She felt like she was getting better day by day.  Baseline cognitive deficits were trending back to baseline.  She was adherent and tolerating cardiac medications.  She was transitioned from losartan to [facility] with continuation of carvedilol and furosemide.  Follow-up labs showed persistent transaminitis with newly elevated ALT leading to the suspension of atorvastatin.  With this, LFTs did improve some leading to the resumption of atorvastatin.   She was seen in the office on 05/16/2022 was started on spironolactone 12.5 mg daily.  Repeat LFTs were obtained on statin therapy which again showed elevations in AST/ALT leading to the recommendation to hold atorvastatin and co-Q41moving forward.   She was seen in the office on 06/19/2022 and remained without symptoms of angina or cardiac decompensation.  She was participating in cardiac rehab.  She reported several episodes of dizziness and difficulty finding words that typically occurred in the evening hours when she was fatigued or looking into a bright light.  MRI of the brain was ordered and was pending.  Zio patch showed a predominant rhythm of sinus with an average rate of 68 bpm (range 47 to 179 bpm), first-degree AV block, 13 episodes of SVT with the fastest interval lasting just 7 beats and the longest interval lasting 10.4 seconds.  There were occasional PVCs with an overall burden of 1.3%.  She was last seen  in the office in 07/2022 and was denies of angina or cardiac decompensation.  She was participating in cardiac rehab.  No further confusion episodes.  Subsequent echo in 07/2022 demonstrated improvement in LV systolic function with an EF of 35 to 40%, grade 1 diastolic dysfunction, akinesis of the mid apical anteroseptal  wall and apical segment, low normal RV systolic function with normal ventricular cavity size, mild to moderate tricuspid regurgitation, and mild aortic stenosis with a mean gradient of 9.5 mmHg.  She comes in accompanied by her daughter today and is without symptoms of angina or cardiac decompensation. She is exercising regularly, and with this notes some fatigue/dyspnea, not similar to prior angina. No falls or symptoms concerning for bleeding. No lower extremity swelling, progressive orthopnea, and or abdominal distension. Appetite is not a good as it previously was. No early satiety. Does watch sodium intake and is drinking less than 2 L of liquids daily. Adherent and tolerating cardiac medications. She does note some back pain, though has scoliosis. Also notes some rhinorrhea with Repatha. Overall, she feels like she is doing quite well.    Labs independently reviewed: 06/2022 - Hgb 12.8, PLT 326, potassium 4.2, BUN 29, serum creatinine 1.3, albumin 3.7, AST 45, ALT 50 05/2022 - AST 139, ALT 159 03/2022 - A1c 5.7, magnesium 2.0, TC 283, TG 126, HDL 57, LDL 201  Past Medical History:  Diagnosis Date   Asthma    CHF (congestive heart failure) (HCC)    Coronary artery disease    GERD (gastroesophageal reflux disease)    Glaucoma    Hypertension    Hypothyroidism    Scoliosis    Seasonal allergies    Skin cancer    Vertigo    Avg: 1x/mo    Past Surgical History:  Procedure Laterality Date   CATARACT EXTRACTION W/PHACO Right 03/13/2022   Procedure: CATARACT EXTRACTION PHACO AND INTRAOCULAR LENS PLACEMENT (IOC) RIGHT WITH HYDRUS MICROSTENT  6.72  00:37.7;  Surgeon: Nevada Crane, MD;  Location: [facility];  Service: Ophthalmology;  Laterality: Right;   CATARACT EXTRACTION W/PHACO Left 03/27/2022   Procedure: CATARACT EXTRACTION PHACO AND INTRAOCULAR LENS PLACEMENT (IOC) LEFT WITH IOL PLUS HYDRUS MICROSTENT 5.21 00:30.9;  Surgeon: Nevada Crane, MD;  Location: [facility];  Service: Ophthalmology;  Laterality: Left;   COLONOSCOPY     COLONOSCOPY WITH PROPOFOL N/A 03/12/2018   Procedure: COLONOSCOPY WITH PROPOFOL;  Surgeon: Pasty Spillers, MD;  Location: [facility];  Service: Endoscopy;  Laterality: N/A;   CORONARY/GRAFT ACUTE MI REVASCULARIZATION N/A 04/03/2022   Procedure: Coronary/Graft Acute MI Revascularization;  Surgeon: Iran Ouch, MD;  Location: [facility] INVASIVE CV LAB;  Service: Cardiovascular;  Laterality: N/A;   LEFT HEART CATH AND CORONARY ANGIOGRAPHY N/A 04/03/2022   Procedure: LEFT HEART CATH AND CORONARY ANGIOGRAPHY;  Surgeon: Iran Ouch, MD;  Location: [facility] INVASIVE CV LAB;  Service: Cardiovascular;  Laterality: N/A;   ORIF TIBIA & FIBULA FRACTURES     TONSILLECTOMY      Current Medications: Current Meds  Medication Sig   albuterol (PROVENTIL HFA;VENTOLIN HFA) 108 (90 Base) MCG/ACT inhaler Inhale into the lungs every 6 (six) hours as needed for wheezing or shortness of breath.   aspirin EC 81 MG tablet Take 1 tablet (81 mg total) by mouth daily. Swallow whole.   cholecalciferol 25 MCG (1000 UT) TABS Take by mouth daily.   dextromethorphan-guaiFENesin (ROBITUSSIN-DM) 10-100 MG/5ML liquid Take 5 mLs by mouth every 4 (four) hours as  needed for cough.   empagliflozin (JARDIANCE) 10 MG TABS tablet Take 1 tablet (10 mg total) by mouth daily before breakfast.   Evolocumab (REPATHA SURECLICK) 140 MG/ML SOAJ Inject 140 mg into the skin every 14 (fourteen) days.   fluticasone (FLOVENT HFA) 220 MCG/ACT inhaler Inhale into the lungs 2 (two) times daily as needed.   furosemide (LASIX) 20 MG tablet Take 1 tablet (20 mg total) by mouth as needed (as needed for increased shortness of breath or weight gain greater than 3 pounds overnight).   lansoprazole (PREVACID) 15 MG capsule Take 15 mg by mouth daily. Take around 8- 9am.   levothyroxine (SYNTHROID) 100 MCG tablet Take 100 mcg by mouth daily.   montelukast (SINGULAIR) 10  MG tablet Take 10 mg by mouth at bedtime.   nitroGLYCERIN (NITROSTAT) 0.4 MG SL tablet Take 1 tablet under the tongue every 5 minutes for up to 3 doses as directed   sacubitril-valsartan (ENTRESTO) 24-26 MG Take 1 tablet by mouth 2 (two) times daily.   ticagrelor (BRILINTA) 90 MG TABS tablet Take 1 tablet (90 mg total) by mouth 2 (two) times daily.   [DISCONTINUED] carvedilol (COREG) 3.125 MG tablet Take 1 tablet (3.125 mg total) by mouth 2 (two) times daily with a meal.   [DISCONTINUED] spironolactone (ALDACTONE) 25 MG tablet Take 0.5 tablets (12.5 mg total) by mouth daily.    Allergies:   Sunflower oil, Tetracycline, Bactrim [sulfamethoxazole-trimethoprim], and Pineapple   Social History   Socioeconomic History   Marital status: Widowed    Spouse name: Not on file   Number of children: Not on file   Years of education: Not on file   Highest education level: Not on file  Occupational History   Not on file  Tobacco Use   Smoking status: Former    Current packs/day: 0.00    Average packs/day: 0.3 packs/day for 15.0 years (3.8 ttl pk-yrs)    Types: Cigarettes    Start date: 10/30/2006    Quit date: 10/29/2021    Years since quitting: 1.0   Smokeless tobacco: Never   Tobacco comments:    She has been on and off smoking since she was 77 years old.  Vaping Use   Vaping status: Never Used  Substance and Sexual Activity   Alcohol use: Yes    Comment: couple x/yr   Drug use: Never   Sexual activity: Not on file  Other Topics Concern   Not on file  Social History Narrative   Not on file   Social Determinants of Health   Financial Resource Strain: Not on file  Food Insecurity: No Food Insecurity (04/03/2022)   Hunger Vital Sign    Worried About Running Out of Food in the Last Year: Never true    Ran Out of Food in the Last Year: Never true  Transportation Needs: No Transportation Needs (04/03/2022)   PRAPARE - Administrator, Civil Service (Medical): No    Lack of  Transportation (Non-Medical): No  Physical Activity: Not on file  Stress: Not on file  Social Connections: Not on file     Family History:  The patient's family history includes Colon polyps in her brother.  ROS:   12-point review of systems is negative unless otherwise noted in the HPI.   EKGs/Labs/Other Studies Reviewed:    Studies reviewed were summarized above. The additional studies were reviewed today:  LHC 04/03/2022:   2nd Diag lesion is 60% stenosed.   Mid LAD to [facility]  LAD lesion is 100% stenosed.   Mid LAD lesion is 40% stenosed.   Dist LAD lesion is 60% stenosed.   Prox RCA lesion is 60% stenosed.   Prox RCA to Mid RCA lesion is 95% stenosed.   A drug-eluting stent was successfully placed using a STENT ONYX FRONTIER 2.25X38.   A drug-eluting stent was successfully placed using a STENT ONYX FRONTIER 3.0X38.   Post intervention, there is a 0% residual stenosis.   Post intervention, there is a 0% residual stenosis.   Post intervention, there is a 0% residual stenosis.   There is moderate left ventricular systolic dysfunction.   LV end diastolic pressure is moderately elevated.   1.  Anterior ST elevation myocardial infarction due to an occluded mid LAD.  In addition, there is severe stenosis in the proximal right coronary artery. 2.  Moderately reduced LV systolic function with an EF of 35% with akinesis of the mid to distal anterior wall, apex and distal inferior wall.  Moderately elevated left ventricular end-diastolic pressure with an LVEDP of 27 mmHg. 3.  Successful angioplasty and drug-eluting stent placement to the mid LAD. 4.  Successful OCT guided PCI and drug-eluting stent placement to the right coronary artery.   Recommendations: Dual antiplatelet therapy for at least 12 months. Aggressive treatment of risk factors. I started small dose carvedilol. __________   2D echo 04/04/2022: 1. Left ventricular ejection fraction, by estimation, is 25 to 30%. The  left  ventricle has severely decreased function. The left ventricle  demonstrates regional wall motion abnormalities (see scoring  diagram/findings for description). Left ventricular  diastolic parameters are consistent with Grade I diastolic dysfunction  (impaired relaxation).   2. Right ventricular systolic function is mildly reduced with akinesis of  the apex. The right ventricular size is moderately enlarged.   3. The mitral valve is normal in structure. Mild mitral valve  regurgitation.   4. The aortic valve has an indeterminant number of cusps. There is mild  calcification of the aortic valve. There is moderate thickening of the  aortic valve. Aortic valve regurgitation is not visualized. Mild aortic  valve stenosis. Aortic valve area, by  VTI measures 1.29 cm. Aortic valve mean gradient measures 5.0 mmHg.  __________   Luci [place] patch 06/2022 Patient had a min HR of 47 bpm, max HR of 179 bpm, and avg HR of 68 bpm. Predominant underlying rhythm was Sinus Rhythm. First Degree AV Block was present.  13 Supraventricular Tachycardia runs occurred, the run with the fastest interval lasting 7 beats with a max rate of 179 bpm, the longest lasting 10.4 secs with an avg rate of 138 bpm.  Occasional PVCs with a burden of 1.3%. __________  2D echo 08/16/2022: 1. Left ventricular ejection fraction, by estimation, is 35 to 40%. The  left ventricle has moderately decreased function. The left ventricle  demonstrates regional wall motion abnormalities (see scoring  diagram/findings for description). Left ventricular   diastolic parameters are consistent with Grade I diastolic dysfunction  (impaired relaxation). There is akinesis of the left ventricular,  mid-apical anteroseptal wall and apical segment.   2. Right ventricular systolic function is low normal. The right  ventricular size is normal.   3. The mitral valve is normal in structure. No evidence of mitral valve  regurgitation.   4. Tricuspid valve  regurgitation is mild to moderate.   5. The aortic valve is calcified. Aortic valve regurgitation is not  visualized. Mild aortic valve stenosis. Aortic valve area, by VTI  measures  1.68 cm. Aortic valve mean gradient measures 9.5 mmHg.   Comparison(s): Previous LVEF reported as 25-30%.     EKG:  EKG is not ordered today.   Recent Labs: 04/03/2022: Magnesium 2.0 06/19/2022: ALT 50; BUN 29; Creatinine, Ser 1.30; Hemoglobin 12.8; Platelets 326; Potassium 4.2; Sodium 137  Recent Lipid Panel    Component Value Date/Time   CHOL 283 (H) 04/03/2022 1003   TRIG 126 04/03/2022 1003   HDL 57 04/03/2022 1003   CHOLHDL 5.0 04/03/2022 1003   VLDL 25 04/03/2022 1003   LDLCALC 201 (H) 04/03/2022 1003    PHYSICAL EXAM:    VS:  BP 120/60 (BP Location: Left Arm, Patient Position: Sitting, Cuff Size: Normal)   Pulse 71   Ht 5\' 3"  (1.6 m)   Wt 160 lb 8 oz (72.8 kg)   SpO2 98%   BMI 28.43 kg/m   BMI: Body mass index is 28.43 kg/m.  Physical Exam Vitals reviewed.  Constitutional:      Appearance: She is well-developed.  HENT:     Head: Normocephalic and atraumatic.  Eyes:     General:        Right eye: No discharge.        Left eye: No discharge.  Neck:     Vascular: No JVD.  Cardiovascular:     Rate and Rhythm: Normal rate and regular rhythm.     Heart sounds: S1 normal and S2 normal. Heart sounds not distant. No midsystolic click and no opening snap. Murmur heard.     Systolic murmur is present with a grade of 1/6 at the upper right sternal border.     No friction rub.  Pulmonary:     Effort: Pulmonary effort is normal. No respiratory distress.     Breath sounds: Normal breath sounds. No decreased breath sounds, wheezing or rales.  Chest:     Chest wall: No tenderness.  Abdominal:     General: There is no distension.  Musculoskeletal:     Cervical back: Normal range of motion.  Skin:    General: Skin is warm and dry.     Nails: There is no clubbing.  Neurological:      Mental Status: She is alert and oriented to person, place, and time.  Psychiatric:        Speech: Speech normal.        Behavior: Behavior normal.        Thought Content: Thought content normal.        Judgment: Judgment normal.     Wt Readings from Last 3 Encounters:  11/17/22 160 lb 8 oz (72.8 kg)  08/16/22 159 lb 3.2 oz (72.2 kg)  08/07/22 156 lb (70.8 kg)     ASSESSMENT & PLAN:   CAD with anterior ST elevation MI without angina: She continues to do very well and is without symptoms concerning for angina. Continue DAPT with ASA 81 mg daily and ticagrelor 90 mg twice daily without interruption for a minimum of 12 months from date of PCI (04/03/2022). Continue aggressive risk factor modification and secondary prevention. No longer on statin therapy as outlined below given abnormal LFTs. Now on Repatha. She has participated with cardiac rehab. No indication for further ischemic testing at this time.    HFrEF secondary to ICM: Euvolemic and well compensated with NYHA class II symptoms. Most recent echo showed a slight improvement in LV systolic function with an EF of 35-40% (improved from 25-30%). She remains on Coreg, [place],  and spironolactone. Add Jardiance 10 mg daily with follow up labs one week after. Look to repeat a limited echo in ~ 3 months to re-evaluate LV systolic function. If her cardiomyopathy persists at that time, consider viability study. With improvement in LV systolic function to 40%, no current indication for EP referral.   Aortic stenosis: Mild and stable by echo in 07/2022. Monitor periodically.   HTN: Blood pressure is well controlled in the office today. Continue medical therapy as outlined above.   HLD: LDL 201 in 03/2022 with goal being less than 55.  Unable to take statins or ezetimibe secondary to worsening liver function. Now on Repatha. Future orders for fasting lipid and CMP placed. WIth noted rhinorrhea, consider changing PCSK9i to bempedoic acid.   Elevated  LFTs: Initially presumed to be in the setting of her MI, however LFT worsened following reinitiation of statin therapy leading this to be discontinued.  Most recent LFTs from 06/2022 improved from prior.    Disposition: F/u with Dr. Kirke Corin or an APP in 3 months.   Medication Adjustments/Labs and Tests Ordered: Current medicines are reviewed at length with the patient today.  Concerns regarding medicines are outlined above. Medication changes, Labs and Tests ordered today are summarized above and listed in the Patient Instructions accessible in Encounters.   Signed, Eula Listen, PA-C 11/17/2022 3:59 PM     [facility] [place] [address] [place], [place] [address] [phone]

## 2022-11-17 ENCOUNTER — Encounter: Payer: Self-pay | Admitting: Physician Assistant

## 2022-11-17 ENCOUNTER — Ambulatory Visit: Payer: Medicare Other | Attending: Physician Assistant | Admitting: Physician Assistant

## 2022-11-17 VITALS — BP 120/60 | HR 71 | Ht 63.0 in | Wt 160.5 lb

## 2022-11-17 DIAGNOSIS — I255 Ischemic cardiomyopathy: Secondary | ICD-10-CM | POA: Insufficient documentation

## 2022-11-17 DIAGNOSIS — E785 Hyperlipidemia, unspecified: Secondary | ICD-10-CM | POA: Insufficient documentation

## 2022-11-17 DIAGNOSIS — R7989 Other specified abnormal findings of blood chemistry: Secondary | ICD-10-CM | POA: Diagnosis present

## 2022-11-17 DIAGNOSIS — I2510 Atherosclerotic heart disease of native coronary artery without angina pectoris: Secondary | ICD-10-CM | POA: Insufficient documentation

## 2022-11-17 DIAGNOSIS — I2102 ST elevation (STEMI) myocardial infarction involving left anterior descending coronary artery: Secondary | ICD-10-CM | POA: Insufficient documentation

## 2022-11-17 DIAGNOSIS — Z79899 Other long term (current) drug therapy: Secondary | ICD-10-CM | POA: Diagnosis present

## 2022-11-17 DIAGNOSIS — I350 Nonrheumatic aortic (valve) stenosis: Secondary | ICD-10-CM | POA: Insufficient documentation

## 2022-11-17 DIAGNOSIS — I5020 Unspecified systolic (congestive) heart failure: Secondary | ICD-10-CM | POA: Insufficient documentation

## 2022-11-17 MED ORDER — EMPAGLIFLOZIN 10 MG PO TABS: 10.0000 mg | ORAL_TABLET | Freq: Every day | ORAL | 3 refills | Status: DC

## 2022-11-17 MED ORDER — CARVEDILOL 3.125 MG PO TABS: 3.1250 mg | ORAL_TABLET | Freq: Two times a day (BID) | ORAL | 3 refills | Status: DC

## 2022-11-17 MED ORDER — SPIRONOLACTONE 25 MG PO TABS: 12.5000 mg | ORAL_TABLET | Freq: Every day | ORAL | 3 refills | Status: DC

## 2022-11-17 NOTE — Patient Instructions (Signed)
Medication Instructions:  Start taking Jardiance 10 mg daily.  *If you need a refill on your cardiac medications before your next appointment, please call your pharmacy*   Lab Work: Your provider would like for you to return in 1 week to have the following labs drawn: CMET and Lipid panel.   Please go to the [facility] entrance and check in at the front desk.  You do not need an appointment.  They are open from 7am-6 pm.  You will need to be fasting.  If you have labs (blood work) drawn today and your tests are completely normal, you will receive your results only by: MyChart Message (if you have MyChart) OR A paper copy in the mail If you have any lab test that is abnormal or we need to change your treatment, we will call you to review the results.   Testing/Procedures: None ordered today.    Follow-Up: At [facility], you and your health needs are our priority.  As part of our continuing mission to provide you with exceptional heart care, we have created designated Provider Care Teams.  These Care Teams include your primary Cardiologist (physician) and Advanced Practice Providers (APPs -  Physician Assistants and Nurse Practitioners) who all work together to provide you with the care you need, when you need it.  We recommend signing up for the patient portal called "MyChart".  Sign up information is provided on this After Visit Summary.  MyChart is used to connect with patients for Virtual Visits (Telemedicine).  Patients are able to view lab/test results, encounter notes, upcoming appointments, etc.  Non-urgent messages can be sent to your provider as well.   To learn more about what you can do with MyChart, go to [email or web address].    Your next appointment:   3 month(s)  Provider:   You may see Lorine Bears, MD or one of the following Advanced Practice Providers on your designated Care Team:    Eula Listen, [place]

## 2022-11-23 ENCOUNTER — Telehealth: Payer: Self-pay | Admitting: *Deleted

## 2022-11-23 NOTE — Telephone Encounter (Signed)
Spoke with patient and she inquired about assistance with Jardiance due to extreme cost. Application has been completed and instructed her to come by office to sign and will then get it processed. She verbalized understanding with no further questions at this time.   Forms placed in "Pending Patient Assistance" folder on my cart.

## 2022-12-05 ENCOUNTER — Telehealth: Payer: Self-pay | Admitting: Pharmacy Technician

## 2022-12-05 ENCOUNTER — Other Ambulatory Visit (HOSPITAL_COMMUNITY): Payer: Self-pay

## 2022-12-05 NOTE — Telephone Encounter (Signed)
Pharmacy Patient Advocate Encounter   Received notification from CoverMyMeds that prior authorization for REPATHA SURECLICK 140MG /ML AUTO-INJECTORS is required/requested.   Insurance verification completed.   The patient is insured through [facility] .   Per test claim: Refill too soon. PA is not needed at this time. Medication was filled 11/25/2022. Next eligible fill date is 12/17/2022.

## 2022-12-05 NOTE — Telephone Encounter (Signed)
Pharmacy Patient Advocate Encounter  Received notification from [employer] that Prior Authorization for REPATHA SURECLICK 140MG /ML AUTO-INJECTORS has been APPROVED from 12/05/2022 to 04/21/2023   PA #/Case ID/Reference #: [id number]

## 2022-12-08 ENCOUNTER — Other Ambulatory Visit: Payer: Self-pay | Admitting: *Deleted

## 2022-12-14 ENCOUNTER — Telehealth: Payer: Self-pay | Admitting: Cardiovascular Disease

## 2022-12-14 ENCOUNTER — Other Ambulatory Visit (HOSPITAL_COMMUNITY): Payer: Self-pay

## 2022-12-14 NOTE — Telephone Encounter (Signed)
[employer] called stating they received the application for repatha, but they are missing information.  Patient didn't fill out her income or where she lives.  They attempted to reach out to the patient but weren't able to get in contact with her.   They said the also need a copy of the prior auth sent to them at fax # [phone]

## 2022-12-14 NOTE — Telephone Encounter (Signed)
Currently our team only handles prior authorization requests.

## 2022-12-14 NOTE — Telephone Encounter (Signed)
[employer] is calling stating that letter head needs to come from pharmacy benefit Production designer, [employer], [employer] for rapatha.   Fax # [phone]  Patient case # [id number] would also need to be included on the cover sheet

## 2022-12-14 NOTE — Telephone Encounter (Signed)
Sending to our prior auth department for further assistance with this medication.

## 2022-12-15 NOTE — Telephone Encounter (Signed)
Refaxed application for assistance today.

## 2022-12-15 NOTE — Telephone Encounter (Signed)
Prior auth approval faxed to [phone].

## 2022-12-28 NOTE — Telephone Encounter (Signed)
Patient was eligible to receive assistance with Repatha and eligibility end date of 05/01/2023

## 2023-02-13 ENCOUNTER — Other Ambulatory Visit: Payer: Self-pay | Admitting: Emergency Medicine

## 2023-02-13 DIAGNOSIS — Z79899 Other long term (current) drug therapy: Secondary | ICD-10-CM

## 2023-02-13 NOTE — Progress Notes (Unsigned)
Cardiology Office Note    Date:  02/14/2023   ID:  Lisa, Hale 05/26/1945, MRN [id number]  PCP:  Dortha Kern, MD  Cardiologist:  Lorine Bears, MD  Electrophysiologist:  None   Chief Complaint: Follow-up  History of Present Illness:   Lisa Hale is a 77 y.o. female with history of CAD with anterior ST elevation MI in 03/2022 status post PCI/DES to the LAD, HFrEF secondary to ICM, CKD stage III, HTN, HLD, hypothyroidism, scoliosis, prior tobacco use quitting in 1999, and GERD who presents for follow-up of CAD and cardiomyopathy.   She was admitted to the hospital in 03/2022 with an anterior ST elevation MI.  Leading up to her admission, she reported substernal chest pain and tightness that radiated to the bilateral arms the day prior.  She was initially evaluated at an urgent care where EKG showed an anterior STEMI with subsequent code STEMI activation and transport to the ED via EMS.  Emergent LHC on 04/03/2022 showed an occluded mid LAD.  In addition, there was severe stenosis in the proximal RCA.  There was moderately reduced LV systolic function with an EF of 35% with akinesis of the mid to distal anterior wall, apex, and distal inferior wall.  Moderately elevated LVEDP of 27 mmHg.  She underwent successful PCI/DES to the mid LAD and successful OCT-guided PCI/DES to the RCA.  Echo during the admission demonstrated an EF of 25 to 30%, akinesis of the mid and distal anterior septum, apical lateral segment, mid inferoseptal segment, apical anterior segment, and apical inferior segment with hypokinesis of the mid inferolateral, mid anterolateral, mid anterior, and mid inferior segments.  There was also grade 1 diastolic dysfunction, mildly reduced RV systolic function with akinesis of the apex, moderately enlarged RV cavity size, mild mitral regurgitation, and aortic valve sclerosis with mild stenosis.  High-sensitivity troponin peaked at greater than 24,000.  LifeVest  was placed prior to discharge, however this had to be discontinued secondary to blisters noted at skin contact points.  She was seen in hospital follow-up on 05/02/2022 with baseline cognitive deficits trending back to baseline.  GDMT was escalated.  Follow-up labs showed persistent transaminitis with newly elevated ALT leading to the suspension of atorvastatin.  With this, LFTs did improve some leading to the resumption of atorvastatin.   She was seen in the office on 05/16/2022 was started on spironolactone 12.5 mg daily.  Repeat LFTs were obtained on statin therapy which again showed elevations in AST/ALT leading to the recommendation to hold atorvastatin and co-Q43moving forward.   She was seen in the office on 06/19/2022 and reported several episodes of dizziness and difficulty finding words that typically occurred in the evening hours when she was fatigued or looking into a bright light.  MRI of the brain was ordered, though ultimately canceled.  Zio patch showed a predominant rhythm of sinus with an average rate of 68 bpm (range 47 to 179 bpm), first-degree AV block, 13 episodes of SVT with the fastest interval lasting just 7 beats and the longest interval lasting 10.4 seconds.  There were occasional PVCs with an overall burden of 1.3%.   She was seen in the office in 07/2022 and was without further confusion episodes.  Subsequent echo in 07/2022 demonstrated improvement in LV systolic function with an EF of 35 to 40%, grade 1 diastolic dysfunction, akinesis of the mid apical anteroseptal wall and apical segment, low normal RV systolic function with normal ventricular cavity size, mild to  moderate tricuspid regurgitation, and mild aortic stenosis with a mean gradient of 9.5 mmHg.  She was most recently seen in the office in 10/2022 and was exercising regularly with some notation of fatigue and dyspnea.  Symptoms did not feel similar to prior angina.  Reported some rhinorrhea with Repatha.  It was recommended  she start Jardiance 10 mg, though she ultimately elected not to start this medication.  She comes in doing very well from a cardiac perspective and is without symptoms of angina or cardiac decompensation.  No palpitations, dyspnea, dizziness, presyncope, or syncope.  No falls or symptoms concerning for bleeding.  No lower extremity swelling or orthopnea.  Weight remains stable.  Adherent and tolerating cardiac medications without issues.  Notes is stable mild rhinorrhea with Repatha.  Does try and follow a heart healthy diet.  Eats ice cream fairly regularly.  Remains active.  Continues to note improvement in functional status.   Labs independently reviewed: 01/2023 - TC 191, TG 121, HDL 50, LDL 117, potassium 4.7, BUN 34, serum creatinine 1.49, albumin 3.9, AST/ALT normal 06/2022 - Hgb 12.8, PLT 326, potassium 4.2, BUN 29, serum creatinine 1.3, albumin 3.7, AST 45, ALT 50 05/2022 - AST 139, ALT 159 03/2022 - A1c 5.7, magnesium 2.0, TC 283, TG 126, HDL 57, LDL 201  Past Medical History:  Diagnosis Date   Asthma    CHF (congestive heart failure) (HCC)    Coronary artery disease    GERD (gastroesophageal reflux disease)    Glaucoma    Hypertension    Hypothyroidism    Scoliosis    Seasonal allergies    Skin cancer    Vertigo    Avg: 1x/mo    Past Surgical History:  Procedure Laterality Date   CATARACT EXTRACTION W/PHACO Right 03/13/2022   Procedure: CATARACT EXTRACTION PHACO AND INTRAOCULAR LENS PLACEMENT (IOC) RIGHT WITH HYDRUS MICROSTENT  6.72  00:37.7;  Surgeon: Nevada Crane, MD;  Location: [facility];  Service: Ophthalmology;  Laterality: Right;   CATARACT EXTRACTION W/PHACO Left 03/27/2022   Procedure: CATARACT EXTRACTION PHACO AND INTRAOCULAR LENS PLACEMENT (IOC) LEFT WITH IOL PLUS HYDRUS MICROSTENT 5.21 00:30.9;  Surgeon: Nevada Crane, MD;  Location: [facility];  Service: Ophthalmology;  Laterality: Left;   COLONOSCOPY     COLONOSCOPY WITH PROPOFOL  N/A 03/12/2018   Procedure: COLONOSCOPY WITH PROPOFOL;  Surgeon: Pasty Spillers, MD;  Location: [facility];  Service: Endoscopy;  Laterality: N/A;   CORONARY/GRAFT ACUTE MI REVASCULARIZATION N/A 04/03/2022   Procedure: Coronary/Graft Acute MI Revascularization;  Surgeon: Iran Ouch, MD;  Location: [facility];  Service: Cardiovascular;  Laterality: N/A;   LEFT HEART CATH AND CORONARY ANGIOGRAPHY N/A 04/03/2022   Procedure: LEFT HEART CATH AND CORONARY ANGIOGRAPHY;  Surgeon: Iran Ouch, MD;  Location: [facility];  Service: Cardiovascular;  Laterality: N/A;   ORIF TIBIA & FIBULA FRACTURES     TONSILLECTOMY      Current Medications: Current Meds  Medication Sig   albuterol (PROVENTIL HFA;VENTOLIN HFA) 108 (90 Base) MCG/ACT inhaler Inhale into the lungs every 6 (six) hours as needed for wheezing or shortness of breath.   aspirin EC 81 MG tablet Take 1 tablet (81 mg total) by mouth daily. Swallow whole.   carvedilol (COREG) 3.125 MG tablet Take 1 tablet (3.125 mg total) by mouth 2 (two) times daily with a meal.   cholecalciferol 25 MCG (1000 UT) TABS Take by mouth daily.   dextromethorphan-guaiFENesin (ROBITUSSIN-DM) 10-100  MG/5ML liquid Take 5 mLs by mouth every 4 (four) hours as needed for cough.   Evolocumab with Infusor (REPATHA PUSHTRONEX SYSTEM) 420 MG/3.5ML SOCT Inject 420 mg into the skin every 30 (thirty) days.   fluticasone (FLOVENT HFA) 220 MCG/ACT inhaler Inhale into the lungs 2 (two) times daily as needed.   furosemide (LASIX) 20 MG tablet Take 1 tablet (20 mg total) by mouth as needed (as needed for increased shortness of breath or weight gain greater than 3 pounds overnight).   lansoprazole (PREVACID) 15 MG capsule Take 15 mg by mouth daily. Take around 8- 9am.   levothyroxine (SYNTHROID) 100 MCG tablet Take 100 mcg by mouth daily.   montelukast (SINGULAIR) 10 MG tablet Take 10 mg by mouth at bedtime.   nitroGLYCERIN (NITROSTAT) 0.4 MG  SL tablet Take 1 tablet under the tongue every 5 minutes for up to 3 doses as directed   phenazopyridine (PYRIDIUM) 200 MG tablet Take 200 mg by mouth 3 (three) times daily as needed.   sacubitril-valsartan (ENTRESTO) 24-26 MG Take 1 tablet by mouth 2 (two) times daily.   spironolactone (ALDACTONE) 25 MG tablet Take 0.5 tablets (12.5 mg total) by mouth daily.   ticagrelor (BRILINTA) 90 MG TABS tablet Take 1 tablet (90 mg total) by mouth 2 (two) times daily.   [DISCONTINUED] Evolocumab (REPATHA SURECLICK) 140 MG/ML SOAJ Inject 140 mg into the skin every 14 (fourteen) days.    Allergies:   Sunflower oil, Tetracycline, Bactrim [sulfamethoxazole-trimethoprim], and Pineapple   Social History   Socioeconomic History   Marital status: Widowed    Spouse name: Not on file   Number of children: Not on file   Years of education: Not on file   Highest education level: Not on file  Occupational History   Not on file  Tobacco Use   Smoking status: Former    Current packs/day: 0.00    Average packs/day: 0.3 packs/day for 15.0 years (3.8 ttl pk-yrs)    Types: Cigarettes    Start date: 10/30/2006    Quit date: 10/29/2021    Years since quitting: 1.2   Smokeless tobacco: Never   Tobacco comments:    She has been on and off smoking since she was 77 years old.  Vaping Use   Vaping status: Never Used  Substance and Sexual Activity   Alcohol use: Yes    Comment: couple x/yr   Drug use: Never   Sexual activity: Not on file  Other Topics Concern   Not on file  Social History Narrative   Not on file   Social Determinants of Health   Financial Resource Strain: Not on file  Food Insecurity: No Food Insecurity (04/03/2022)   Hunger Vital Sign    Worried About Running Out of Food in the Last Year: Never true    Ran Out of Food in the Last Year: Never true  Transportation Needs: No Transportation Needs (04/03/2022)   PRAPARE - Administrator, Civil Service (Medical): No    Lack of  Transportation (Non-Medical): No  Physical Activity: Not on file  Stress: Not on file  Social Connections: Not on file     Family History:  The patient's family history includes Colon polyps in her brother.  ROS:   12-point review of systems is negative unless otherwise noted in the HPI.   EKGs/Labs/Other Studies Reviewed:    Studies reviewed were summarized above. The additional studies were reviewed today:  LHC 04/03/2022:   2nd Diag lesion  is 60% stenosed.   Mid LAD to Dist LAD lesion is 100% stenosed.   Mid LAD lesion is 40% stenosed.   Dist LAD lesion is 60% stenosed.   Prox RCA lesion is 60% stenosed.   Prox RCA to Mid RCA lesion is 95% stenosed.   A drug-eluting stent was successfully placed using a STENT ONYX FRONTIER 2.25X38.   A drug-eluting stent was successfully placed using a STENT ONYX FRONTIER 3.0X38.   Post intervention, there is a 0% residual stenosis.   Post intervention, there is a 0% residual stenosis.   Post intervention, there is a 0% residual stenosis.   There is moderate left ventricular systolic dysfunction.   LV end diastolic pressure is moderately elevated.   1.  Anterior ST elevation myocardial infarction due to an occluded mid LAD.  In addition, there is severe stenosis in the proximal right coronary artery. 2.  Moderately reduced LV systolic function with an EF of 35% with akinesis of the mid to distal anterior wall, apex and distal inferior wall.  Moderately elevated left ventricular end-diastolic pressure with an LVEDP of 27 mmHg. 3.  Successful angioplasty and drug-eluting stent placement to the mid LAD. 4.  Successful OCT guided PCI and drug-eluting stent placement to the right coronary artery.   Recommendations: Dual antiplatelet therapy for at least 12 months. Aggressive treatment of risk factors. I started small dose carvedilol. __________   2D echo 04/04/2022: 1. Left ventricular ejection fraction, by estimation, is 25 to 30%. The  left  ventricle has severely decreased function. The left ventricle  demonstrates regional wall motion abnormalities (see scoring  diagram/findings for description). Left ventricular  diastolic parameters are consistent with Grade I diastolic dysfunction  (impaired relaxation).   2. Right ventricular systolic function is mildly reduced with akinesis of  the apex. The right ventricular size is moderately enlarged.   3. The mitral valve is normal in structure. Mild mitral valve  regurgitation.   4. The aortic valve has an indeterminant number of cusps. There is mild  calcification of the aortic valve. There is moderate thickening of the  aortic valve. Aortic valve regurgitation is not visualized. Mild aortic  valve stenosis. Aortic valve area, by  VTI measures 1.29 cm. Aortic valve mean gradient measures 5.0 mmHg.  __________   Luci Bank patch 06/2022 Patient had a min HR of 47 bpm, max HR of 179 bpm, and avg HR of 68 bpm. Predominant underlying rhythm was Sinus Rhythm. First Degree AV Block was present.  13 Supraventricular Tachycardia runs occurred, the run with the fastest interval lasting 7 beats with a max rate of 179 bpm, the longest lasting 10.4 secs with an avg rate of 138 bpm.  Occasional PVCs with a burden of 1.3%. __________   2D echo 08/16/2022: 1. Left ventricular ejection fraction, by estimation, is 35 to 40%. The  left ventricle has moderately decreased function. The left ventricle  demonstrates regional wall motion abnormalities (see scoring  diagram/findings for description). Left ventricular   diastolic parameters are consistent with Grade I diastolic dysfunction  (impaired relaxation). There is akinesis of the left ventricular,  mid-apical anteroseptal wall and apical segment.   2. Right ventricular systolic function is low normal. The right  ventricular size is normal.   3. The mitral valve is normal in structure. No evidence of mitral valve  regurgitation.   4. Tricuspid valve  regurgitation is mild to moderate.   5. The aortic valve is calcified. Aortic valve regurgitation is not  visualized. Mild aortic valve stenosis. Aortic valve area, by VTI measures  1.68 cm. Aortic valve mean gradient measures 9.5 mmHg.   Comparison(s): Previous LVEF reported as 25-30%.    EKG:  EKG is ordered today.  The EKG ordered today demonstrates NSR, 66 bpm, incomplete RBBB with prior inferior MI and anterolateral T wave inversion, consistent with prior tracing  Recent Labs: 04/03/2022: Magnesium 2.0 06/19/2022: Hemoglobin 12.8; Platelets 326 02/14/2023: ALT 17; BUN 34; Creatinine, Ser 1.49; Potassium 4.7; Sodium 137  Recent Lipid Panel    Component Value Date/Time   CHOL 191 02/14/2023 1035   TRIG 121 02/14/2023 1035   HDL 50 02/14/2023 1035   CHOLHDL 3.8 02/14/2023 1035   VLDL 24 02/14/2023 1035   LDLCALC 117 (H) 02/14/2023 1035    PHYSICAL EXAM:    VS:  BP 120/63 (BP Location: Left Arm, Patient Position: Sitting, Cuff Size: Normal)   Pulse 66   Ht 5\' 3"  (1.6 m)   Wt 162 lb (73.5 kg)   SpO2 97%   BMI 28.70 kg/m   BMI: Body mass index is 28.7 kg/m.  Physical Exam Vitals reviewed.  Constitutional:      Appearance: She is well-developed.  HENT:     Head: Normocephalic and atraumatic.  Eyes:     General:        Right eye: No discharge.        Left eye: No discharge.  Neck:     Vascular: No JVD.  Cardiovascular:     Rate and Rhythm: Normal rate and regular rhythm.     Pulses:          Posterior tibial pulses are 2+ on the right side and 2+ on the left side.     Heart sounds: S1 normal and S2 normal. Heart sounds not distant. No midsystolic click and no opening snap. Murmur heard.     Systolic murmur is present with a grade of 1/6 at the upper right sternal border.     No friction rub.  Pulmonary:     Effort: Pulmonary effort is normal. No respiratory distress.     Breath sounds: Normal breath sounds. No decreased breath sounds, wheezing, rhonchi or rales.   Chest:     Chest wall: No tenderness.  Abdominal:     General: There is no distension.  Musculoskeletal:     Cervical back: Normal range of motion.     Right lower leg: No edema.     Left lower leg: No edema.  Skin:    General: Skin is warm and dry.     Nails: There is no clubbing.  Neurological:     Mental Status: She is alert and oriented to person, place, and time.  Psychiatric:        Speech: Speech normal.        Behavior: Behavior normal.        Thought Content: Thought content normal.        Judgment: Judgment normal.     Wt Readings from Last 3 Encounters:  02/14/23 162 lb (73.5 kg)  11/17/22 160 lb 8 oz (72.8 kg)  08/16/22 159 lb 3.2 oz (72.2 kg)     ASSESSMENT & PLAN:   CAD with anterior ST elevation MI without angina: She continues to do very well from a cardiac perspective and is without symptoms concerning for angina or cardiac decompensation.  Continue DAPT with ASA 81 mg daily and ticagrelor 90 mg twice daily without interruption for minimum of  12 months from date of PCI (04/03/2022).  Continue aggressive risk factor modification and secondary prevention.  No longer on statin therapy as outlined below given abnormal LFTs.  Titrate Repatha as outlined below given LDL remains above goal.  She has participated with cardiac rehab.  No indication for further ischemic testing at this time.  HFrEF secondary to ICM: Euvolemic and well compensated with NYHA class I-II symptoms.  Most recent echo showed slight improvement in LV systolic function with an EF of 35 to 40% (improved from 25 to 30%).  She remains on carvedilol, Entresto, and spironolactone.  She is hesitant to add Jardiance at this time.  With improvement in LV systolic function to 40%, no indication for EP referral at this time.  QRS does not meet a CRT criteria.  Aortic stenosis: Mild and stable by echo in 07/2022.  Monitor periodically.  HTN: Blood pressure is well-controlled in the office today.  Remains on  carvedilol, Entresto, and spironolactone as outlined above.  HLD with statin intolerance: Has been unable to tolerate statin/ezetimibe in the setting of abnormal LFTs with pharmacotherapy.  In this setting, she was initiated on Repatha 140 mg every 2 weeks.  With PCSK9 inhibitor therapy, LDL has improved from 201 to 117 with target LDL at least less than 70, ideally less than 55.  However, LDL remains above target.  Titrate Repatha to 420 mg monthly with documented CVD.  Elevated LFTs: Initially presumed to be in the setting of her MI, however LFT worsened following reinitiation of statin therapy leading this to be discontinued.  Off statin therapy, LFTs normalized.   Disposition: F/u with Dr. Kirke Corin or an APP in 05/2023.   Medication Adjustments/Labs and Tests Ordered: Current medicines are reviewed at length with the patient today.  Concerns regarding medicines are outlined above. Medication changes, Labs and Tests ordered today are summarized above and listed in the Patient Instructions accessible in Encounters.   Signed, Lisa Listen, PA-C 02/14/2023 2:45 PM     [facility] [address] [place], [place] [address] [phone]

## 2023-02-14 ENCOUNTER — Encounter: Payer: Self-pay | Admitting: Physician Assistant

## 2023-02-14 ENCOUNTER — Other Ambulatory Visit
Admission: RE | Admit: 2023-02-14 | Discharge: 2023-02-14 | Disposition: A | Discharge status: Home / Self Care | Payer: Medicare Other | Attending: Physician Assistant | Admitting: Physician Assistant

## 2023-02-14 ENCOUNTER — Ambulatory Visit: Payer: Medicare Other | Attending: Physician Assistant | Admitting: Physician Assistant

## 2023-02-14 VITALS — BP 120/63 | HR 66 | Ht 63.0 in | Wt 162.0 lb

## 2023-02-14 DIAGNOSIS — I255 Ischemic cardiomyopathy: Secondary | ICD-10-CM | POA: Insufficient documentation

## 2023-02-14 DIAGNOSIS — I2510 Atherosclerotic heart disease of native coronary artery without angina pectoris: Secondary | ICD-10-CM | POA: Diagnosis present

## 2023-02-14 DIAGNOSIS — I5020 Unspecified systolic (congestive) heart failure: Secondary | ICD-10-CM | POA: Insufficient documentation

## 2023-02-14 DIAGNOSIS — Z79899 Other long term (current) drug therapy: Secondary | ICD-10-CM | POA: Diagnosis not present

## 2023-02-14 DIAGNOSIS — R7989 Other specified abnormal findings of blood chemistry: Secondary | ICD-10-CM | POA: Diagnosis present

## 2023-02-14 DIAGNOSIS — I350 Nonrheumatic aortic (valve) stenosis: Secondary | ICD-10-CM | POA: Diagnosis present

## 2023-02-14 DIAGNOSIS — E785 Hyperlipidemia, unspecified: Secondary | ICD-10-CM | POA: Diagnosis not present

## 2023-02-14 LAB — COMPREHENSIVE METABOLIC PANEL
ALT: 17 U/L (ref 0–44)
AST: 28 U/L (ref 15–41)
Albumin: 3.9 g/dL (ref 3.5–5.0)
Alkaline Phosphatase: 99 U/L (ref 38–126)
Anion gap: 7 (ref 5–15)
BUN: 34 mg/dL — ABNORMAL HIGH (ref 8–23)
CO2: 23 mmol/L (ref 22–32)
Calcium: 9 mg/dL (ref 8.9–10.3)
Chloride: 107 mmol/L (ref 98–111)
Creatinine, Ser: 1.49 mg/dL — ABNORMAL HIGH (ref 0.44–1.00)
GFR, Estimated: 36 mL/min — ABNORMAL LOW (ref >60)
Glucose, Bld: 86 mg/dL (ref 70–99)
Potassium: 4.7 mmol/L (ref 3.5–5.1)
Sodium: 137 mmol/L (ref 135–145)
Total Bilirubin: 0.6 mg/dL (ref 0.3–1.2)
Total Protein: 8.3 g/dL — ABNORMAL HIGH (ref 6.5–8.1)

## 2023-02-14 LAB — LIPID PANEL
Cholesterol: 191 mg/dL (ref 0–200)
HDL: 50 mg/dL (ref >40)
LDL Cholesterol: 117 mg/dL — ABNORMAL HIGH (ref 0–99)
Total CHOL/HDL Ratio: 3.8 {ratio}
Triglycerides: 121 mg/dL (ref <150)
VLDL: 24 mg/dL (ref 0–40)

## 2023-02-14 MED ORDER — REPATHA PUSHTRONEX SYSTEM 420 MG/3.5ML ~~LOC~~ SOCT: 420.0000 mg | SUBCUTANEOUS | 2 refills | Status: DC

## 2023-02-14 NOTE — Patient Instructions (Addendum)
Medication Instructions:   Your physician has recommended you make the following change in your medication:  INCREASE- Repatha 420 mg/ml injection once monthly.  *If you need a refill on your cardiac medications before your next appointment, please call your pharmacy*   Lab Work:  NONE  If you have labs (blood work) drawn today and your tests are completely normal, you will receive your results only by: MyChart Message (if you have MyChart) OR A paper copy in the mail If you have any lab test that is abnormal or we need to change your treatment, we will call you to review the results.   Testing/Procedures:  NONE   Follow-Up: At [facility], you and your health needs are our priority.  As part of our continuing mission to provide you with exceptional heart care, we have created designated Provider Care Teams.  These Care Teams include your primary Cardiologist (physician) and Advanced Practice Providers (APPs -  Physician Assistants and Nurse Practitioners) who all work together to provide you with the care you need, when you need it.  We recommend signing up for the patient portal called "MyChart".  Sign up information is provided on this After Visit Summary.  MyChart is used to connect with patients for Virtual Visits (Telemedicine).  Patients are able to view lab/test results, encounter notes, upcoming appointments, etc.  Non-urgent messages can be sent to your provider as well.   To learn more about what you can do with MyChart, go to [email or web address].    Your next appointment:   January 2025    Provider:   You may see Lorine Bears, MD or one of the following Advanced Practice Providers on your designated Care Team:   Nicolasa Ducking, NP Eula Listen, PA-C Cadence Fransico Michael, PA-C Charlsie Quest, NP

## 2023-02-22 ENCOUNTER — Telehealth: Payer: Self-pay | Admitting: Cardiovascular Disease

## 2023-02-22 NOTE — Telephone Encounter (Signed)
Pt c/o medication issue:  1. Name of Medication: Evolocumab with Infusor (REPATHA PUSHTRONEX SYSTEM) 420 MG/3.5ML SOCT   2. How are you currently taking this medication (dosage and times per day)? Inject 420 mg into the skin every 30 (thirty) days.  3. Are you having a reaction (difficulty breathing--STAT)? No  4. What is your medication issue? Patient is calling because she requested [place] to send this to [facility]. Patient completely forgot, this medication needs to be sent through [employer]. Please advise.

## 2023-02-22 NOTE — Telephone Encounter (Signed)
Spoke with patient and advised that I would need to discuss this with provider when he returns next week. She verbalized understanding with no further questions at this time.     Called [employer] and they no longer have the 420 mg dose of Repatha. Will update provider and then be in touch with patient next week.

## 2023-02-23 NOTE — Telephone Encounter (Signed)
Thank you for the update. If she is agreeable, I would like for her to be seen by the lipid clinic in [place] (Dr. Rennis Golden). I am not certain if we could use bempedoic acid and PCSK9 inhibitor together or if we should transition to inclisiran. They could assist with this to reduce her ASCVD risk.  Please also let her know that my daughter loved the Squishy. It made her day!

## 2023-02-26 ENCOUNTER — Other Ambulatory Visit (HOSPITAL_COMMUNITY): Payer: Self-pay

## 2023-02-26 MED ORDER — REPATHA SURECLICK 140 MG/ML ~~LOC~~ SOAJ
140.0000 mg | SUBCUTANEOUS | 11 refills | Status: DC
  Filled 2023-02-26: qty 2, 28d supply, fill #0

## 2023-02-26 NOTE — Telephone Encounter (Signed)
Chart medication(s) updated

## 2023-02-26 NOTE — Telephone Encounter (Signed)
Yes, okay to change her back to Repatha 140 mg every 2 weeks.

## 2023-02-26 NOTE — Telephone Encounter (Signed)
Spoke with patient and reviewed recommendations from provider in regards to the lipid clinic. She declined referral and would just like to stay on her current dose Repatha 140 mg. Advised that I would update provider on her wishes and she verbalized understanding and had no further questions.

## 2023-03-08 ENCOUNTER — Other Ambulatory Visit (HOSPITAL_COMMUNITY): Payer: Self-pay

## 2023-05-23 ENCOUNTER — Ambulatory Visit: Payer: Medicare Other | Admitting: Physician Assistant

## 2023-06-01 NOTE — Progress Notes (Unsigned)
Cardiology Office Note    Date:  06/04/2023   ID:  Lisa Hale 09-16-1945, MRN [id number]  PCP:  Lisa Kern, MD  Cardiologist:  Lisa Bears, MD  Electrophysiologist:  None   Chief Complaint: Follow-up  History of Present Illness:   Lisa Hale is a 78 y.o. female with history of CAD with anterior ST elevation MI in 03/2022 status post PCI/DES to the LAD, HFrEF secondary to ICM, HTN, HLD, hypothyroidism, scoliosis, prior tobacco use quitting in 1999, and GERD who presents for follow-up of CAD and cardiomyopathy.   She was admitted to the hospital from 12/4 through 04/05/2022 with an anterior ST elevation MI.  Leading up to her admission, she reported substernal chest pain and tightness that radiated to the bilateral arms the day prior.  She was initially evaluated at an urgent care where EKG showed an anterior STEMI with subsequent code STEMI activation and transport to the ED via EMS.  Emergent LHC on 04/03/2022 showed an occluded mid LAD.  In addition, there was severe stenosis in the proximal RCA.  There was moderately reduced LV systolic function with an EF of 35% with akinesis of the mid to distal anterior wall, apex, and distal inferior wall.  Moderately elevated LVEDP of 27 mmHg.  She underwent successful PCI/DES to the mid LAD and successful OCT-guided PCI/DES to the RCA.  Echo during the admission demonstrated an EF of 25 to 30%, akinesis of the mid and distal anterior septum, apical lateral segment, mid inferoseptal segment, apical anterior segment, and apical inferior segment with hypokinesis of the mid inferolateral, mid anterolateral, mid anterior, and mid inferior segments.  There was also grade 1 diastolic dysfunction, mildly reduced RV systolic function with akinesis of the apex, moderately enlarged RV cavity size, mild mitral regurgitation, and aortic valve sclerosis with mild stenosis.  High-sensitivity troponin peaked at greater than 24,000.  LifeVest  was placed prior to discharge, however this had to be discontinued secondary to blisters noted at skin contact points.  She was seen in hospital follow-up on 05/02/2022 and was without symptoms of angina or decompensation.  She felt like she was getting better day by day.  Baseline cognitive deficits were trending back to baseline.  She was adherent and tolerating cardiac medications.  She was transitioned from losartan to [facility] with continuation of carvedilol and furosemide.  Follow-up labs showed persistent transaminitis with newly elevated ALT leading to the suspension of atorvastatin.  With this, LFTs did improve some leading to the resumption of atorvastatin.   She was seen in the office on 05/16/2022 was started on spironolactone 12.5 mg daily.  Repeat LFTs were obtained on statin therapy which again showed elevations in AST/ALT leading to the recommendation to hold atorvastatin and co-Q67moving forward.   She was seen in the office on 06/19/2022 and remained without symptoms of angina or cardiac decompensation.  She was participating in cardiac rehab.  She reported several episodes of dizziness and difficulty finding words that typically occurred in the evening hours when she was fatigued or looking into a bright light.  MRI of the brain was ordered, though ultimately deferred.  Zio patch showed a predominant rhythm of sinus with an average rate of 68 bpm (range 47 to 179 bpm), first-degree AV block, 13 episodes of SVT with the fastest interval lasting just 7 beats and the longest interval lasting 10.4 seconds.  There were occasional PVCs with an overall burden of 1.3%.   She was seen in  the office in 07/2022 and was without further confusion episodes.  Subsequent echo in 07/2022 demonstrated improvement in LV systolic function with an EF of 35 to 40%, grade 1 diastolic dysfunction, akinesis of the mid apical anteroseptal wall and apical segment, low normal RV systolic function with normal ventricular  cavity size, mild to moderate tricuspid regurgitation, and mild aortic stenosis with a mean gradient of 9.5 mmHg.  She was most recently seen in the office in 10/2022 and remained without symptoms of angina or cardiac decompensation.  She was exercising on a regular basis with some associated fatigue and dyspnea, not similar to prior angina.  Overall, she felt like she was doing well.  She comes in accompanied by her daughter today and is doing well from a cardiac perspective.  No symptoms of angina or cardiac decompensation.  No palpitations, dizziness, presyncope, or syncope.  In the setting of the cold weather she has not been exercising as much, though now that the weather is starting to get warm again, at least temporarily, she plans to resume her walking regimen.  No falls or symptoms concerning for bleeding.  Adherent and tolerating cardiac medications without adverse effect.  No lower extremity swelling or progressive orthopnea.  Overall feels like she is doing very well from a cardiac perspective and does not have any acute concerns at this time.   Labs independently reviewed: 01/2023 - TC 191, TG 121, HDL 50, LDL 117, potassium 4.7, BUN 34, serum creatinine 1.49, albumin 3.8, AST/ALT normal 06/2022 - Hgb 12.8, PLT 326 03/2022 - A1c 5.7, magnesium 2.0  Past Medical History:  Diagnosis Date   Asthma    CHF (congestive heart failure) (HCC)    Coronary artery disease    GERD (gastroesophageal reflux disease)    Glaucoma    Hypertension    Hypothyroidism    Scoliosis    Seasonal allergies    Skin cancer    Vertigo    Avg: 1x/mo    Past Surgical History:  Procedure Laterality Date   CATARACT EXTRACTION W/PHACO Right 03/13/2022   Procedure: CATARACT EXTRACTION PHACO AND INTRAOCULAR LENS PLACEMENT (IOC) RIGHT WITH HYDRUS MICROSTENT  6.72  00:37.7;  Surgeon: Lisa Crane, MD;  Location: [facility];  Service: Ophthalmology;  Laterality: Right;   CATARACT EXTRACTION  W/PHACO Left 03/27/2022   Procedure: CATARACT EXTRACTION PHACO AND INTRAOCULAR LENS PLACEMENT (IOC) LEFT WITH IOL PLUS HYDRUS MICROSTENT 5.21 00:30.9;  Surgeon: Lisa Crane, MD;  Location: [facility];  Service: Ophthalmology;  Laterality: Left;   COLONOSCOPY     COLONOSCOPY WITH PROPOFOL N/A 03/12/2018   Procedure: COLONOSCOPY WITH PROPOFOL;  Surgeon: Pasty Spillers, MD;  Location: [facility];  Service: Endoscopy;  Laterality: N/A;   CORONARY/GRAFT ACUTE MI REVASCULARIZATION N/A 04/03/2022   Procedure: Coronary/Graft Acute MI Revascularization;  Surgeon: Iran Ouch, MD;  Location: [facility] INVAS[facility];  Service: Cardiovascular;  Laterality: N/A;   LEFT HEART CATH AND CORONARY ANGIOGRAPHY N/A 04/03/2022   Procedure: LEFT HEART CATH AND CORONARY ANGIOGRAPHY;  Surgeon: Iran Ouch, MD;  Location: [facility] INVAS[facility] CV LAB;  Service: Cardiovascular;  Laterality: N/A;   ORIF TIBIA & FIBULA FRACTURES     TONSILLECTOMY      Current Medications: Current Meds  Medication Sig   aspirin EC 81 MG tablet Take 1 tablet (81 mg total) by mouth daily. Swallow whole.   carvedilol (COREG) 3.125 MG tablet Take 1 tablet (3.125 mg total) by mouth 2 (two) times daily  with a meal.   lansoprazole (PREVACID) 15 MG capsule Take 15 mg by mouth daily. Take around 8- 9am.   levothyroxine (SYNTHROID) 100 MCG tablet Take 100 mcg by mouth daily.   montelukast (SINGULAIR) 10 MG tablet Take 10 mg by mouth at bedtime.   nitroGLYCERIN (NITROSTAT) 0.4 MG SL tablet Take 1 tablet under the tongue every 5 minutes for up to 3 doses as directed   spironolactone (ALDACTONE) 25 MG tablet Take 0.5 tablets (12.5 mg total) by mouth daily.   [DISCONTINUED] Evolocumab (REPATHA SURECLICK) 140 MG/ML SOAJ Inject 140 mg into the skin every 14 (fourteen) days.   [DISCONTINUED] sacubitril-valsartan (ENTRESTO) 24-26 MG Take 1 tablet by mouth 2 (two) times daily.   [DISCONTINUED] ticagrelor (BRILINTA) 90 MG TABS  tablet Take 1 tablet (90 mg total) by mouth 2 (two) times daily.    Allergies:   Sunflower oil, Tetracycline, Bactrim [sulfamethoxazole-trimethoprim], and Pineapple   Social History   Socioeconomic History   Marital status: Widowed    Spouse name: Not on file   Number of children: Not on file   Years of education: Not on file   Highest education level: Not on file  Occupational History   Not on file  Tobacco Use   Smoking status: Former    Current packs/day: 0.00    Average packs/day: 0.3 packs/day for 15.0 years (3.8 ttl pk-yrs)    Types: Cigarettes    Start date: 10/30/2006    Quit date: 10/29/2021    Years since quitting: 1.5   Smokeless tobacco: Never   Tobacco comments:    She has been on and off smoking since she was 78 years old.  Vaping Use   Vaping status: Never Used  Substance and Sexual Activity   Alcohol use: Yes    Comment: couple x/yr   Drug use: Never   Sexual activity: Not on file  Other Topics Concern   Not on file  Social History Narrative   Not on file   Social Drivers of Health   Financial Resource Strain: Not on file  Food Insecurity: No Food Insecurity (04/03/2022)   Hunger Vital Sign    Worried About Running Out of Food in the Last Year: Never true    Ran Out of Food in the Last Year: Never true  Transportation Needs: No Transportation Needs (04/03/2022)   PRAPARE - Administrator, Civil Service (Medical): No    Lack of Transportation (Non-Medical): No  Physical Activity: Not on file  Stress: Not on file  Social Connections: Not on file     Family History:  The patient's family history includes Colon polyps in her brother.  ROS:   12-point review of systems is negative unless otherwise noted in the HPI.   EKGs/Labs/Other Studies Reviewed:    Studies reviewed were summarized above. The additional studies were reviewed today:  LHC 04/03/2022:   2nd Diag lesion is 60% stenosed.   Mid LAD to Dist LAD lesion is 100% stenosed.    Mid LAD lesion is 40% stenosed.   Dist LAD lesion is 60% stenosed.   Prox RCA lesion is 60% stenosed.   Prox RCA to Mid RCA lesion is 95% stenosed.   A drug-eluting stent was successfully placed using a STENT ONYX FRONTIER 2.25X38.   A drug-eluting stent was successfully placed using a STENT ONYX FRONTIER 3.0X38.   Post intervention, there is a 0% residual stenosis.   Post intervention, there is a 0% residual stenosis.  Post intervention, there is a 0% residual stenosis.   There is moderate left ventricular systolic dysfunction.   LV end diastolic pressure is moderately elevated.   1.  Anterior ST elevation myocardial infarction due to an occluded mid LAD.  In addition, there is severe stenosis in the proximal right coronary artery. 2.  Moderately reduced LV systolic function with an EF of 35% with akinesis of the mid to distal anterior wall, apex and distal inferior wall.  Moderately elevated left ventricular end-diastolic pressure with an LVEDP of 27 mmHg. 3.  Successful angioplasty and drug-eluting stent placement to the mid LAD. 4.  Successful OCT guided PCI and drug-eluting stent placement to the right coronary artery.   Recommendations: Dual antiplatelet therapy for at least 12 months. Aggressive treatment of risk factors. I started small dose carvedilol. __________   2D echo 04/04/2022: 1. Left ventricular ejection fraction, by estimation, is 25 to 30%. The  left ventricle has severely decreased function. The left ventricle  demonstrates regional wall motion abnormalities (see scoring  diagram/findings for description). Left ventricular  diastolic parameters are consistent with Grade I diastolic dysfunction  (impaired relaxation).   2. Right ventricular systolic function is mildly reduced with akinesis of  the apex. The right ventricular size is moderately enlarged.   3. The mitral valve is normal in structure. Mild mitral valve  regurgitation.   4. The aortic valve has an  indeterminant number of cusps. There is mild  calcification of the aortic valve. There is moderate thickening of the  aortic valve. Aortic valve regurgitation is not visualized. Mild aortic  valve stenosis. Aortic valve area, by  VTI measures 1.29 cm. Aortic valve mean gradient measures 5.0 mmHg.  __________   Luci Bank patch 06/2022 Patient had a min HR of 47 bpm, max HR of 179 bpm, and avg HR of 68 bpm. Predominant underlying rhythm was Sinus Rhythm. First Degree AV Block was present.  13 Supraventricular Tachycardia runs occurred, the run with the fastest interval lasting 7 beats with a max rate of 179 bpm, the longest lasting 10.4 secs with an avg rate of 138 bpm.  Occasional PVCs with a burden of 1.3%. __________   2D echo 08/16/2022: 1. Left ventricular ejection fraction, by estimation, is 35 to 40%. The  left ventricle has moderately decreased function. The left ventricle  demonstrates regional wall motion abnormalities (see scoring  diagram/findings for description). Left ventricular   diastolic parameters are consistent with Grade I diastolic dysfunction  (impaired relaxation). There is akinesis of the left ventricular,  mid-apical anteroseptal wall and apical segment.   2. Right ventricular systolic function is low normal. The right  ventricular size is normal.   3. The mitral valve is normal in structure. No evidence of mitral valve  regurgitation.   4. Tricuspid valve regurgitation is mild to moderate.   5. The aortic valve is calcified. Aortic valve regurgitation is not  visualized. Mild aortic valve stenosis. Aortic valve area, by VTI measures  1.68 cm. Aortic valve mean gradient measures 9.5 mmHg.   Comparison(s): Previous LVEF reported as 25-30%.   EKG:  EKG is not ordered today.    Recent Labs: 06/19/2022: Hemoglobin 12.8; Platelets 326 02/14/2023: ALT 17; BUN 34; Creatinine, Ser 1.49; Potassium 4.7; Sodium 137  Recent Lipid Panel    Component Value Date/Time   CHOL  191 02/14/2023 1035   TRIG 121 02/14/2023 1035   HDL 50 02/14/2023 1035   CHOLHDL 3.8 02/14/2023 1035   VLDL 24 02/14/2023  1035   LDLCALC 117 (H) 02/14/2023 1035    PHYSICAL EXAM:    VS:  BP 138/79 (BP Location: Left Arm, Patient Position: Sitting, Cuff Size: Normal)   Pulse 73   Ht 5\' 3"  (1.6 m)   Wt 165 lb 3.2 oz (74.9 kg)   SpO2 98%   BMI 29.26 kg/m   BMI: Body mass index is 29.26 kg/m.  Physical Exam Vitals reviewed.  Constitutional:      Appearance: She is well-developed.  HENT:     Head: Normocephalic and atraumatic.  Eyes:     General:        Right eye: No discharge.        Left eye: No discharge.  Neck:     Vascular: No JVD.  Cardiovascular:     Rate and Rhythm: Normal rate and regular rhythm.     Pulses:          Posterior tibial pulses are 2+ on the right side and 2+ on the left side.     Heart sounds: S1 normal and S2 normal. Heart sounds not distant. No midsystolic click and no opening snap. Murmur heard.     Systolic murmur is present with a grade of 1/6 at the upper right sternal border.     No friction rub.  Pulmonary:     Effort: Pulmonary effort is normal. No respiratory distress.     Breath sounds: Normal breath sounds. No decreased breath sounds, wheezing, rhonchi or rales.  Chest:     Chest wall: No tenderness.  Abdominal:     General: There is no distension.  Musculoskeletal:     Cervical back: Normal range of motion.     Right lower leg: No edema.     Left lower leg: No edema.  Skin:    General: Skin is warm and dry.     Nails: There is no clubbing.  Neurological:     Mental Status: She is alert and oriented to person, place, and time.  Psychiatric:        Speech: Speech normal.        Behavior: Behavior normal.        Thought Content: Thought content normal.        Judgment: Judgment normal.     Wt Readings from Last 3 Encounters:  06/04/23 165 lb 3.2 oz (74.9 kg)  02/14/23 162 lb (73.5 kg)  11/17/22 160 lb 8 oz (72.8 kg)      ASSESSMENT & PLAN:   CAD with anterior ST elevation MI without angina: She continues to do very well and is without symptoms of angina or cardiac decompensation.  Continue DAPT with aspirin 81 mg daily and ticagrelor 90 mg twice daily.  In follow-up, consider reducing ticagrelor to 60 mg twice daily.  Will treat at current dose for the next several months given extensive stenting into both the LAD and RCA at the time of her MI.  Continue aggressive risk factor modification and secondary prevention including carvedilol and Repatha.  She has participated with cardiac rehab.  No indication for further ischemic testing at this time.  HFrEF secondary to ICM: Euvolemic and well compensated with NYHA class I-II symptoms.  Most recent echo showed a slight improvement in LV systolic function with an EF of 35 to 40% which is improved from 25 to 30% at time of MI.  Continue current GDMT including carvedilol 3.125 mg twice daily, Entresto 24/26 mg twice daily, and spironolactone 12.5 mg daily.  Historically has been hesitant to add Jardiance.  With improvement in LV systolic function of 40%, no indication for EP referral.  QRS does not meet CRT criteria.  Plan for follow-up echo in 5 to 6 months with recommendation to escalate GDMT as/if indicated.  Aortic stenosis: Mild and stable by echo in 07/2022.  Update echo in 5 months given we will be pursuing evaluation of her cardiomyopathy at that time.  HTN: Blood pressure is reasonably controlled in the office today.  Continue pharmacotherapy as outlined above.  HLD: LDL 117 in 01/2023, improved from 201 in 03/2022.  Target LDL less than 55.  Unable to take statins or ezetimibe secondary to worsening of liver function.  Remains on Repatha 140 mg every 14 days.  Anticipate follow-up labs at next visit.  If LDL remains above goal will need to consider addition of bempedoic acid.  Elevated LFTs: Initially presumed to be in the setting of her MI, however LFT worsened  following reinitiation of statin therapy leading this to be discontinued.  LFTs normalized in 01/2023 off of statin therapy.     Disposition: F/u with Dr. Kirke Corin or an APP in 6 months.   Medication Adjustments/Labs and Tests Ordered: Current medicines are reviewed at length with the patient today.  Concerns regarding medicines are outlined above. Medication changes, Labs and Tests ordered today are summarized above and listed in the Patient Instructions accessible in Encounters.   Signed, Eula Listen, PA-C 06/04/2023 4:38 PM     [facility] -  [address] [place], [place] [address] [phone]

## 2023-06-04 ENCOUNTER — Encounter: Payer: Self-pay | Admitting: Physician Assistant

## 2023-06-04 ENCOUNTER — Telehealth: Payer: Self-pay | Admitting: Cardiovascular Disease

## 2023-06-04 ENCOUNTER — Ambulatory Visit: Payer: Medicare Other | Attending: Physician Assistant | Admitting: Physician Assistant

## 2023-06-04 VITALS — BP 138/79 | HR 73 | Ht 63.0 in | Wt 165.2 lb

## 2023-06-04 DIAGNOSIS — I350 Nonrheumatic aortic (valve) stenosis: Secondary | ICD-10-CM | POA: Diagnosis not present

## 2023-06-04 DIAGNOSIS — I2510 Atherosclerotic heart disease of native coronary artery without angina pectoris: Secondary | ICD-10-CM | POA: Diagnosis present

## 2023-06-04 DIAGNOSIS — I5020 Unspecified systolic (congestive) heart failure: Secondary | ICD-10-CM | POA: Diagnosis not present

## 2023-06-04 DIAGNOSIS — I255 Ischemic cardiomyopathy: Secondary | ICD-10-CM | POA: Diagnosis present

## 2023-06-04 DIAGNOSIS — I10 Essential (primary) hypertension: Secondary | ICD-10-CM | POA: Diagnosis present

## 2023-06-04 DIAGNOSIS — E785 Hyperlipidemia, unspecified: Secondary | ICD-10-CM

## 2023-06-04 MED ORDER — ENTRESTO 24-26 MG PO TABS: 1.0000 | ORAL_TABLET | Freq: Two times a day (BID) | ORAL | 11 refills | Status: DC

## 2023-06-04 MED ORDER — REPATHA SURECLICK 140 MG/ML ~~LOC~~ SOAJ: 140.0000 mg | SUBCUTANEOUS | 11 refills | Status: DC

## 2023-06-04 MED ORDER — TICAGRELOR 90 MG PO TABS: 90.0000 mg | ORAL_TABLET | Freq: Two times a day (BID) | ORAL | 11 refills | Status: AC

## 2023-06-04 NOTE — Patient Instructions (Signed)
Medication Instructions:  Your Physician recommend you continue on your current medication as directed.    *If you need a refill on your cardiac medications before your next appointment, please call your pharmacy*   Lab Work: None ordered at this time  If you have labs (blood work) drawn today and your tests are completely normal, you will receive your results only by: MyChart Message (if you have MyChart) OR A paper copy in the mail If you have any lab test that is abnormal or we need to change your treatment, we will call you to review the results.   Testing/Procedures: Your physician has requested that you have an echocardiogram (in 5 months). Echocardiography is a painless test that uses sound waves to create images of your heart. It provides your doctor with information about the size and shape of your heart and how well your heart's chambers and valves are working.   You may receive an ultrasound enhancing agent through an IV if needed to better visualize your heart during the echo. This procedure takes approximately one hour.  There are no restrictions for this procedure.  This will take place at [address] [facility] [address]) [address], [place] [address]  Please note: We ask at that you not bring children with you during ultrasound (echo/ vascular) testing. Due to room size and safety concerns, children are not allowed in the ultrasound rooms during exams. Our front office staff cannot provide observation of children in our lobby area while testing is being conducted. An adult accompanying a patient to their appointment will only be allowed in the ultrasound room at the discretion of the ultrasound technician under special circumstances. We apologize for any inconvenience.    Follow-Up: At [facility], you and your health needs are our priority.  As part of our continuing mission to provide you with exceptional heart care, we have created designated Provider Care  Teams.  These Care Teams include your primary Cardiologist (physician) and Advanced Practice Providers (APPs -  Physician Assistants and Nurse Practitioners) who all work together to provide you with the care you need, when you need it.    Your next appointment:   6 month(s)  Provider:   You may see Lorine Bears, MD or one of the following Advanced Practice Providers on your designated Care Team:   Eula Listen, [place]

## 2023-06-04 NOTE — Telephone Encounter (Signed)
Patient dropped off envelope with Medication assistance information in envelope. Envelope placed in Pam's box.

## 2023-06-05 NOTE — Telephone Encounter (Signed)
 Spoke with patient and reviewed that I do need her signature on her patient assistance application. She verbalized understanding and will come by next Tuesday. No further needs at this time.   Placed application on cart in folder Current Pending Assistance.

## 2023-06-11 ENCOUNTER — Telehealth: Payer: Self-pay | Admitting: *Deleted

## 2023-06-11 NOTE — Telephone Encounter (Signed)
 Patient would like to come in today to sign paperwork due to possible risk of inclement weather. Advised that she could come today and to just ask for me at the front desk. She verbalized understanding with no further questions at this time.

## 2023-06-11 NOTE — Telephone Encounter (Signed)
 Patient came in today to get forms signed. All forms completed and will work on getting them faxed out.

## 2023-06-14 NOTE — Telephone Encounter (Signed)
Application completed, signed, and faxed. No further needs.

## 2023-06-15 NOTE — Telephone Encounter (Signed)
Late entry, Application completed, signed and faxed on 06/14/23. No further needs.

## 2023-06-29 ENCOUNTER — Other Ambulatory Visit (HOSPITAL_COMMUNITY): Payer: Self-pay

## 2023-06-29 ENCOUNTER — Telehealth: Payer: Self-pay | Admitting: Physician Assistant

## 2023-06-29 ENCOUNTER — Telehealth: Payer: Self-pay | Admitting: Cardiovascular Disease

## 2023-06-29 MED ORDER — REPATHA SURECLICK 140 MG/ML ~~LOC~~ SOAJ
140.0000 mg | SUBCUTANEOUS | 3 refills | Status: DC
  Filled 2023-06-29: qty 6, 84d supply, fill #0

## 2023-06-29 MED ORDER — REPATHA SURECLICK 140 MG/ML ~~LOC~~ SOAJ: 140.0000 mg | SUBCUTANEOUS | 3 refills | Status: DC

## 2023-06-29 NOTE — Telephone Encounter (Signed)
I did not need this encounter. °

## 2023-06-29 NOTE — Telephone Encounter (Signed)
*  STAT* If patient is at the pharmacy, call can be transferred to refill team.   1. Which medications need to be refilled? (please list name of each medication and dose if known)   Evolocumab (REPATHA SURECLICK) 140 MG/ML SOAJ    4. Which pharmacy/location (including street and city if local pharmacy) is medication to be sent to?  [id number] - [place], [place] - [address] AT [facility] [address] [facility] [address] Phone: [phone]  Fax: [phone]       5. Do they need a 30 day or 90 day supply? 90

## 2023-06-29 NOTE — Addendum Note (Signed)
 Addended by: Malena Peer D on: 06/29/2023 03:43 PM   Modules accepted: Orders

## 2023-09-29 ENCOUNTER — Other Ambulatory Visit: Payer: Self-pay | Admitting: Physician Assistant

## 2023-10-17 ENCOUNTER — Other Ambulatory Visit (HOSPITAL_COMMUNITY): Payer: Self-pay

## 2023-11-07 ENCOUNTER — Ambulatory Visit: Payer: Medicare Other | Attending: Physician Assistant

## 2023-11-07 DIAGNOSIS — I255 Ischemic cardiomyopathy: Secondary | ICD-10-CM | POA: Insufficient documentation

## 2023-11-07 DIAGNOSIS — I350 Nonrheumatic aortic (valve) stenosis: Secondary | ICD-10-CM | POA: Insufficient documentation

## 2023-11-07 LAB — ECHOCARDIOGRAM COMPLETE
AR max vel: 1.02 cm2
AV Area VTI: 1 cm2
AV Area mean vel: 1.06 cm2
AV Mean grad: 7 mmHg
AV Peak grad: 12.5 mmHg
Ao pk vel: 1.77 m/s
Area-P 1/2: 3.53 cm2
S' Lateral: 3.03 cm

## 2023-11-07 MED ORDER — PERFLUTREN LIPID MICROSPHERE
1.0000 mL | INTRAVENOUS | Status: AC | PRN
  Administered 2023-11-07: 3 mL via INTRAVENOUS

## 2023-11-09 ENCOUNTER — Ambulatory Visit: Payer: Self-pay | Admitting: Physician Assistant

## 2023-11-11 ENCOUNTER — Other Ambulatory Visit: Payer: Self-pay | Admitting: Physician Assistant

## 2023-11-15 ENCOUNTER — Other Ambulatory Visit: Payer: Self-pay | Admitting: Emergency Medicine

## 2023-11-15 ENCOUNTER — Encounter: Payer: Self-pay | Admitting: Physician Assistant

## 2023-11-15 ENCOUNTER — Ambulatory Visit: Attending: Physician Assistant | Admitting: Physician Assistant

## 2023-11-15 VITALS — BP 120/60 | HR 56 | Ht 63.0 in | Wt 160.0 lb

## 2023-11-15 DIAGNOSIS — E785 Hyperlipidemia, unspecified: Secondary | ICD-10-CM | POA: Diagnosis present

## 2023-11-15 DIAGNOSIS — I5020 Unspecified systolic (congestive) heart failure: Secondary | ICD-10-CM | POA: Diagnosis present

## 2023-11-15 DIAGNOSIS — R7989 Other specified abnormal findings of blood chemistry: Secondary | ICD-10-CM | POA: Insufficient documentation

## 2023-11-15 DIAGNOSIS — I10 Essential (primary) hypertension: Secondary | ICD-10-CM | POA: Insufficient documentation

## 2023-11-15 DIAGNOSIS — Z79899 Other long term (current) drug therapy: Secondary | ICD-10-CM

## 2023-11-15 DIAGNOSIS — I350 Nonrheumatic aortic (valve) stenosis: Secondary | ICD-10-CM | POA: Insufficient documentation

## 2023-11-15 DIAGNOSIS — I2510 Atherosclerotic heart disease of native coronary artery without angina pectoris: Secondary | ICD-10-CM | POA: Diagnosis present

## 2023-11-15 DIAGNOSIS — I255 Ischemic cardiomyopathy: Secondary | ICD-10-CM | POA: Insufficient documentation

## 2023-11-15 MED ORDER — SACUBITRIL-VALSARTAN 49-51 MG PO TABS: 1.0000 | ORAL_TABLET | Freq: Two times a day (BID) | ORAL | 3 refills | Status: DC

## 2023-11-15 NOTE — Progress Notes (Signed)
 Cardiology Office Note    Date:  11/15/2023   ID:  Semya, Klinke 1945/05/04, MRN [id number]  PCP:  Derick Leita POUR, MD  Cardiologist:  Deatrice Cage, MD  Electrophysiologist:  None   Chief Complaint: Follow up  History of Present Illness:   Lisa Hale is a 78 y.o. female with history of CAD with anterior ST elevation MI in 03/2022 status post PCI/DES to the LAD, HFrEF secondary to ICM, HTN, HLD, hypothyroidism, scoliosis, prior tobacco use quitting in 1999, and GERD who presents for follow-up of CAD and cardiomyopathy.  She was admitted to the hospital from 12/4 through 04/05/2022 with an anterior ST elevation MI.  Leading up to her admission, she reported substernal chest pain and tightness that radiated to the bilateral arms the day prior.  She was initially evaluated at an urgent care where EKG showed an anterior STEMI with subsequent code STEMI activation and transport to the ED via EMS.  Emergent LHC on 04/03/2022 showed an occluded mid LAD.  In addition, there was severe stenosis in the proximal RCA.  There was moderately reduced LV systolic function with an EF of 35% with akinesis of the mid to distal anterior wall, apex, and distal inferior wall.  Moderately elevated LVEDP of 27 mmHg.  She underwent successful PCI/DES to the mid LAD and successful OCT-guided PCI/DES to the RCA.  Echo during the admission demonstrated an EF of 25 to 30%, akinesis of the mid and distal anterior septum, apical lateral segment, mid inferoseptal segment, apical anterior segment, and apical inferior segment with hypokinesis of the mid inferolateral, mid anterolateral, mid anterior, and mid inferior segments.  There was also grade 1 diastolic dysfunction, mildly reduced RV systolic function with akinesis of the apex, moderately enlarged RV cavity size, mild mitral regurgitation, and aortic valve sclerosis with mild stenosis.  High-sensitivity troponin peaked at greater than 24,000.  LifeVest  was placed prior to discharge, however this had to be discontinued secondary to blisters noted at skin contact points.  She was seen in hospital follow-up on 05/02/2022 and was without symptoms of angina or decompensation.  She felt like she was getting better day by day.  Baseline cognitive deficits were trending back to baseline.  She was adherent and tolerating cardiac medications.  She was transitioned from losartan  to Entresto  with continuation of carvedilol  and furosemide .  Follow-up labs showed persistent transaminitis with newly elevated ALT leading to the suspension of atorvastatin .  With this, LFTs did improve some leading to the resumption of atorvastatin .   She was seen in the office on 05/16/2022 was started on spironolactone  12.5 mg daily.  Repeat LFTs were obtained on statin therapy which again showed elevations in AST/ALT leading to the recommendation to hold atorvastatin  and co-Q37moving forward.   She was seen in the office on 06/19/2022 and remained without symptoms of angina or cardiac decompensation.  She was participating in cardiac rehab.  She reported several episodes of dizziness and difficulty finding words that typically occurred in the evening hours when she was fatigued or looking into a bright light.  MRI of the brain was ordered, though ultimately deferred.  Zio patch showed a predominant rhythm of sinus with an average rate of 68 bpm (range 47 to 179 bpm), first-degree AV block, 13 episodes of SVT with the fastest interval lasting just 7 beats and the longest interval lasting 10.4 seconds.  There were occasional PVCs with an overall burden of 1.3%.   She was seen in  the office in 07/2022 and was without further confusion episodes.  Subsequent echo in 07/2022 demonstrated improvement in LV systolic function with an EF of 35 to 40%, grade 1 diastolic dysfunction, akinesis of the mid apical anteroseptal wall and apical segment, low normal RV systolic function with normal ventricular  cavity size, mild to moderate tricuspid regurgitation, and mild aortic stenosis with a mean gradient of 9.5 mmHg.   She was last seen in the office in 06/2023 and remained without symptoms of angina or cardiac decompensation.  She was not exercising as much in the cold weather.  Echo in 10/2023 showed a persistent cardiomyopathy with an EF of 30 to 35%, severe hypokinesis of the anterior, anteroseptal, septal, apical, and inferoapical regions, grade 1 diastolic dysfunction, normal RV systolic function imaging with a cavity size, mildly elevated RVSP estimated at 41.7 mmHg, mild to moderate tricuspid regurgitation, aortic valve sclerosis without evidence of stenosis, and an estimated right atrial pressure of 3 mmHg.  She comes in today continuing to do well from a cardiac perspective and remains without symptoms of angina or cardiac decompensation.  She remains active at baseline, though is somewhat limited in her walking regimen due to the excessive feet.  No significant lower extremity swelling, progressive orthopnea, or abdominal distention.  Her weight is down 5 pounds today when compared to her last clinic visit in 06/2023.  No falls or symptoms concerning for bleeding.  No dizziness, presyncope, or syncope.   Labs independently reviewed: 01/2023 - TC 191, TG 121, HDL 50, LDL 117, potassium 4.7, BUN 34, serum creatinine 1.49, albumin 3.8, AST/ALT normal 06/2022 - Hgb 12.8, PLT 326 03/2022 - A1c 5.7, magnesium 2.0  Past Medical History:  Diagnosis Date   Asthma    CHF (congestive heart failure) (HCC)    Coronary artery disease    GERD (gastroesophageal reflux disease)    Glaucoma    Hypertension    Hypothyroidism    Scoliosis    Seasonal allergies    Skin cancer    Vertigo    Avg: 1x/mo    Past Surgical History:  Procedure Laterality Date   CATARACT EXTRACTION W/PHACO Right 03/13/2022   Procedure: CATARACT EXTRACTION PHACO AND INTRAOCULAR LENS PLACEMENT (IOC) RIGHT WITH HYDRUS  MICROSTENT  6.72  00:37.7;  Surgeon: Myrna Adine Anes, MD;  Location: [facility];  Service: Ophthalmology;  Laterality: Right;   CATARACT EXTRACTION W/PHACO Left 03/27/2022   Procedure: CATARACT EXTRACTION PHACO AND INTRAOCULAR LENS PLACEMENT (IOC) LEFT WITH IOL PLUS HYDRUS MICROSTENT 5.21 00:30.9;  Surgeon: Myrna Adine Anes, MD;  Location: [facility];  Service: Ophthalmology;  Laterality: Left;   COLONOSCOPY     COLONOSCOPY WITH PROPOFOL  N/A 03/12/2018   Procedure: COLONOSCOPY WITH PROPOFOL ;  Surgeon: Janalyn Keene NOVAK, MD;  Location: [facility];  Service: Endoscopy;  Laterality: N/A;   CORONARY/GRAFT ACUTE MI REVASCULARIZATION N/A 04/03/2022   Procedure: Coronary/Graft Acute MI Revascularization;  Surgeon: Darron Deatrice LABOR, MD;  Location: [facility];  Service: Cardiovascular;  Laterality: N/A;   LEFT HEART CATH AND CORONARY ANGIOGRAPHY N/A 04/03/2022   Procedure: LEFT HEART CATH AND CORONARY ANGIOGRAPHY;  Surgeon: Darron Deatrice LABOR, MD;  Location: [facility];  Service: Cardiovascular;  Laterality: N/A;   ORIF TIBIA & FIBULA FRACTURES     TONSILLECTOMY      Current Medications: Current Meds  Medication Sig   albuterol  (PROVENTIL  HFA;VENTOLIN  HFA) 108 (90 Base) MCG/ACT inhaler Inhale into the lungs every 6 (six) hours as  needed for wheezing or shortness of breath.   aspirin  EC 81 MG tablet Take 1 tablet (81 mg total) by mouth daily. Swallow whole.   carvedilol  (COREG ) 3.125 MG tablet TAKE 1 TABLET(3.125 MG) BY MOUTH TWICE DAILY WITH A MEAL   cholecalciferol  25 MCG (1000 UT) TABS Take by mouth daily.   Evolocumab  (REPATHA  SURECLICK) 140 MG/ML SOAJ ADMINISTER 1 ML UNDER THE SKIN EVERY 14 DAYS   fluticasone (FLOVENT HFA) 220 MCG/ACT inhaler Inhale into the lungs 2 (two) times daily as needed.   furosemide  (LASIX ) 20 MG tablet Take 1 tablet (20 mg total) by mouth as needed (as needed for increased shortness of breath or weight gain greater than  3 pounds overnight).   lansoprazole (PREVACID) 15 MG capsule Take 15 mg by mouth daily. Take around 8- 9am.   levothyroxine  (SYNTHROID ) 100 MCG tablet Take 100 mcg by mouth daily.   montelukast  (SINGULAIR ) 10 MG tablet Take 10 mg by mouth at bedtime.   nitroGLYCERIN  (NITROSTAT ) 0.4 MG SL tablet Take 1 tablet under the tongue every 5 minutes for up to 3 doses as directed   phenazopyridine (PYRIDIUM) 200 MG tablet Take 200 mg by mouth 3 (three) times daily as needed.   spironolactone  (ALDACTONE ) 25 MG tablet TAKE 1/2 TABLET(12.5 MG) BY MOUTH DAILY   ticagrelor  (BRILINTA ) 90 MG TABS tablet Take 1 tablet (90 mg total) by mouth 2 (two) times daily.   [DISCONTINUED] sacubitril -valsartan  (ENTRESTO ) 24-26 MG Take 1 tablet by mouth 2 (two) times daily.   [DISCONTINUED] sacubitril -valsartan  (ENTRESTO ) 49-51 MG Take 1 tablet by mouth 2 (two) times daily.    Allergies:   Sunflower oil, Tetracycline, Bactrim [sulfamethoxazole-trimethoprim], and Pineapple   Social History   Socioeconomic History   Marital status: Widowed    Spouse name: Not on file   Number of children: Not on file   Years of education: Not on file   Highest education level: Not on file  Occupational History   Not on file  Tobacco Use   Smoking status: Former    Current packs/day: 0.00    Average packs/day: 0.3 packs/day for 15.0 years (3.8 ttl pk-yrs)    Types: Cigarettes    Start date: 10/30/2006    Quit date: 10/29/2021    Years since quitting: 2.0   Smokeless tobacco: Never   Tobacco comments:    She has been on and off smoking since she was 78 years old.  Vaping Use   Vaping status: Never Used  Substance and Sexual Activity   Alcohol use: Yes    Comment: couple x/yr   Drug use: Never   Sexual activity: Not on file  Other Topics Concern   Not on file  Social History Narrative   Not on file   Social Drivers of Health   Financial Resource Strain: Not on file  Food Insecurity: No Food Insecurity (04/03/2022)   Hunger  Vital Sign    Worried About Running Out of Food in the Last Year: Never true    Ran Out of Food in the Last Year: Never true  Transportation Needs: No Transportation Needs (04/03/2022)   PRAPARE - Administrator, Civil Service (Medical): No    Lack of Transportation (Non-Medical): No  Physical Activity: Not on file  Stress: Not on file  Social Connections: Not on file     Family History:  The patient's family history includes Colon polyps in her brother.  ROS:   12-point review of systems is negative unless  otherwise noted in the HPI.   EKGs/Labs/Other Studies Reviewed:    Studies reviewed were summarized above. The additional studies were reviewed today:  LHC 04/03/2022:   2nd Diag lesion is 60% stenosed.   Mid LAD to Dist LAD lesion is 100% stenosed.   Mid LAD lesion is 40% stenosed.   Dist LAD lesion is 60% stenosed.   Prox RCA lesion is 60% stenosed.   Prox RCA to Mid RCA lesion is 95% stenosed.   A drug-eluting stent was successfully placed using a STENT ONYX FRONTIER 2.25X38.   A drug-eluting stent was successfully placed using a STENT ONYX FRONTIER 3.0X38.   Post intervention, there is a 0% residual stenosis.   Post intervention, there is a 0% residual stenosis.   Post intervention, there is a 0% residual stenosis.   There is moderate left ventricular systolic dysfunction.   LV end diastolic pressure is moderately elevated.   1.  Anterior ST elevation myocardial infarction due to an occluded mid LAD.  In addition, there is severe stenosis in the proximal right coronary artery. 2.  Moderately reduced LV systolic function with an EF of 35% with akinesis of the mid to distal anterior wall, apex and distal inferior wall.  Moderately elevated left ventricular end-diastolic pressure with an LVEDP of 27 mmHg. 3.  Successful angioplasty and drug-eluting stent placement to the mid LAD. 4.  Successful OCT guided PCI and drug-eluting stent placement to the right coronary  artery.   Recommendations: Dual antiplatelet therapy for at least 12 months. Aggressive treatment of risk factors. I started small dose carvedilol . __________   2D echo 04/04/2022: 1. Left ventricular ejection fraction, by estimation, is 25 to 30%. The  left ventricle has severely decreased function. The left ventricle  demonstrates regional wall motion abnormalities (see scoring  diagram/findings for description). Left ventricular  diastolic parameters are consistent with Grade I diastolic dysfunction  (impaired relaxation).   2. Right ventricular systolic function is mildly reduced with akinesis of  the apex. The right ventricular size is moderately enlarged.   3. The mitral valve is normal in structure. Mild mitral valve  regurgitation.   4. The aortic valve has an indeterminant number of cusps. There is mild  calcification of the aortic valve. There is moderate thickening of the  aortic valve. Aortic valve regurgitation is not visualized. Mild aortic  valve stenosis. Aortic valve area, by  VTI measures 1.29 cm. Aortic valve mean gradient measures 5.0 mmHg.  __________   Zio patch 06/2022 Patient had a min HR of 47 bpm, max HR of 179 bpm, and avg HR of 68 bpm. Predominant underlying rhythm was Sinus Rhythm. First Degree AV Block was present.  13 Supraventricular Tachycardia runs occurred, the run with the fastest interval lasting 7 beats with a max rate of 179 bpm, the longest lasting 10.4 secs with an avg rate of 138 bpm.  Occasional PVCs with a burden of 1.3%. __________   2D echo 08/16/2022: 1. Left ventricular ejection fraction, by estimation, is 35 to 40%. The  left ventricle has moderately decreased function. The left ventricle  demonstrates regional wall motion abnormalities (see scoring  diagram/findings for description). Left ventricular   diastolic parameters are consistent with Grade I diastolic dysfunction  (impaired relaxation). There is akinesis of the left  ventricular,  mid-apical anteroseptal wall and apical segment.   2. Right ventricular systolic function is low normal. The right  ventricular size is normal.   3. The mitral valve is normal in  structure. No evidence of mitral valve  regurgitation.   4. Tricuspid valve regurgitation is mild to moderate.   5. The aortic valve is calcified. Aortic valve regurgitation is not  visualized. Mild aortic valve stenosis. Aortic valve area, by VTI measures  1.68 cm. Aortic valve mean gradient measures 9.5 mmHg.   Comparison(s): Previous LVEF reported as 25-30%. __________  2D echo 11/07/2023: 1. Left ventricular ejection fraction, by estimation, is 30 to 35%. The  left ventricle has moderately decreased function. The left ventricle  demonstrates regional wall motion abnormalities (Severe hypokinesis of the  anterior, anteroseptal/septal, apical   and inferoapical regions). Left ventricular diastolic parameters are  consistent with Grade I diastolic dysfunction (impaired relaxation).   2. Right ventricular systolic function is normal. The right ventricular  size is normal. There is mildly elevated pulmonary artery systolic  pressure. The estimated right ventricular systolic pressure is 41.7 mmHg.   3. The mitral valve is normal in structure. No evidence of mitral valve  regurgitation. No evidence of mitral stenosis.   4. Tricuspid valve regurgitation is mild to moderate.   5. The aortic valve is normal in structure. Aortic valve regurgitation is  not visualized. Aortic valve sclerosis is present, with no evidence of  aortic valve stenosis.   6. The inferior vena cava is normal in size with greater than 50%  respiratory variability, suggesting right atrial pressure of 3 mmHg.     EKG:  EKG is ordered today.  The EKG ordered today demonstrates sinus bradycardia, 56 bpm, incomplete RBBB, prior anterior and inferior infarct, consistent with prior tracing  Recent Labs: 02/14/2023: ALT 17; BUN 34;  Creatinine, Ser 1.49; Potassium 4.7; Sodium 137  Recent Lipid Panel    Component Value Date/Time   CHOL 191 02/14/2023 1035   TRIG 121 02/14/2023 1035   HDL 50 02/14/2023 1035   CHOLHDL 3.8 02/14/2023 1035   VLDL 24 02/14/2023 1035   LDLCALC 117 (H) 02/14/2023 1035    PHYSICAL EXAM:    VS:  BP 120/60 (BP Location: Left Arm, Patient Position: Sitting, Cuff Size: Large)   Pulse (!) 56   Ht 5' 3 (1.6 m)   Wt 160 lb (72.6 kg)   SpO2 99%   BMI 28.34 kg/m   BMI: Body mass index is 28.34 kg/m.  Physical Exam Vitals reviewed.  Constitutional:      Appearance: She is well-developed.  HENT:     Head: Normocephalic and atraumatic.  Eyes:     General:        Right eye: No discharge.        Left eye: No discharge.  Cardiovascular:     Rate and Rhythm: Normal rate and regular rhythm.     Pulses:          Posterior tibial pulses are 2+ on the right side and 2+ on the left side.     Heart sounds: S1 normal and S2 normal. Heart sounds not distant. No midsystolic click and no opening snap. Murmur heard.     Systolic murmur is present with a grade of 1/6 at the upper right sternal border.     No friction rub.  Pulmonary:     Effort: Pulmonary effort is normal. No respiratory distress.     Breath sounds: Normal breath sounds. No decreased breath sounds, wheezing, rhonchi or rales.  Chest:     Chest wall: No tenderness.  Musculoskeletal:     Cervical back: Normal range of motion.  Right lower leg: No edema.     Left lower leg: No edema.  Skin:    General: Skin is warm and dry.     Nails: There is no clubbing.  Neurological:     Mental Status: She is alert and oriented to person, place, and time.  Psychiatric:        Speech: Speech normal.        Behavior: Behavior normal.        Thought Content: Thought content normal.        Judgment: Judgment normal.     Wt Readings from Last 3 Encounters:  11/15/23 160 lb (72.6 kg)  06/04/23 165 lb 3.2 oz (74.9 kg)  02/14/23 162 lb  (73.5 kg)     ASSESSMENT & PLAN:   CAD with anterior ST elevation MI without angina: She continues to do well and is without symptoms concerning for angina.  She remains on DAPT with aspirin  81 mg daily and ticagrelor  90 mg twice daily given extensive stenting into both the LAD and RCA at the time of her MI.  Consider reduction to 60 mg twice daily versus mono platelet therapy and follow-up.  Continue aggressive risk factor modification and secondary prevention including carvedilol  and Repatha .  She has participated with cardiac rehab.  If there is further progression of her cardiomyopathy on follow-up echo, will need to consider further ischemic testing.  However, given she is asymptomatic at this time and in the setting of the likelihood that her echo reads are similar, we will pursue pharmacotherapy as outlined below initially.  Precautions discussed.  HFrEF secondary to ICM: Euvolemic and well compensated with NYHA class I-II symptoms.  Most recent echo from earlier this month that showed a slight reduction in LV systolic function with an EF of 30 to 35% (prior echo 35 to 40% in 07/2022), with an overall improvement in EF from 25 to 30% at time of MI in 03/2022.  Query difference in reading between the 2 reports.  Given she is asymptomatic we will escalate pharmacotherapy with titration of Entresto  to 49/51 mg twice daily with continuation of carvedilol  3.125 mg twice daily (bradycardia precludes further titration of beta-blocker) and continuing Jardiance  12.5 mg daily.  She has not needed any as needed furosemide .  She has historically been hesitant to add Jardiance .  Precautions discussed regarding titration of Entresto .  Follow-up labs 1 week after titration of Entresto .  Repeat limited echo in 2 months on higher dose Entresto  to trend cardiomyopathy and to further evaluate wall motion.  Aortic stenosis: Echo earlier this month showed aortic valve sclerosis without evidence of stenosis.  HTN: Blood  pressure is well-controlled in the office today.  Continue pharmacotherapy as outlined above.  HLD: LDL 117 in 01/2023, improved from 201 in 03/2022.  Unable to take statins or ezetimibe secondary to worsening liver function.  Remains on Repatha  140 mg injected every 14 days and is tolerating without off target effect.  Future order placed for fasting lipid panel to be obtained next week.  Elevated LFTs: Initially presumed to be in the setting of her MI, however LFT worsened following reinitiation of statin therapy leading this to be discontinued.  LFTs normalized in 01/2023 off of statin therapy.      Disposition: F/u with Dr. Darron or an APP in 8-9 weeks (after limited echo).   Medication Adjustments/Labs and Tests Ordered: Current medicines are reviewed at length with the patient today.  Concerns regarding medicines are outlined above. Medication changes,  Labs and Tests ordered today are summarized above and listed in the Patient Instructions accessible in Encounters.   Signed, [name] Bring, PA-C 11/15/2023 3:52 PM     [facility] [place] [address] [place], [place] [address] [phone]

## 2023-11-15 NOTE — Patient Instructions (Addendum)
 Medication Instructions:  Your physician recommends the following medication changes.  INCREASE: Entresto  49-51 mg twice daily  *If you need a refill on your cardiac medications before your next appointment, please call your pharmacy*  Lab Work: Your provider would like for you to return in 1 week to have the following labs drawn: CMeT and Lipid panel.   Please go to [facility] [address] ([facility]) [address], [place] [address] You do not need an appointment.  They are open from 8 am- 4:30 pm.  Lunch from 1:00 pm- 2:00 pm You DO need to be fasting.  You may also go to one of the following [facility]:  [address] [place],  [place]  [address]  [place]  Phone: [phone] Lab hours: Mon-Fri 8 am- 5 pm    Lunch 12 pm- 1 pm   If you have labs (blood work) drawn today and your tests are completely normal, you will receive your results only by: MyChart Message (if you have MyChart) OR A paper copy in the mail If you have any lab test that is abnormal or we need to change your treatment, we will call you to review the results.  Testing/Procedures: Your physician has requested that you have an LIMITED echocardiogram in 2 months. Echocardiography is a painless test that uses sound waves to create images of your heart. It provides your doctor with information about the size and shape of your heart and how well your heart's chambers and valves are working.   You may receive an ultrasound enhancing agent through an IV if needed to better visualize your heart during the echo. This procedure takes approximately one hour.  There are no restrictions for this procedure.  This will take place at [address] [facility]) [address], [place] [address]  Please note: We ask at that you not bring children with you during ultrasound (echo/ vascular) testing. Due to room size and safety concerns, children are not allowed in the ultrasound rooms during exams. Our front  office staff cannot provide observation of children in our lobby area while testing is being conducted. An adult accompanying a patient to their appointment will only be allowed in the ultrasound room at the discretion of the ultrasound technician under special circumstances. We apologize for any inconvenience.  Follow-Up: At [facility], you and your health needs are our priority.  As part of our continuing mission to provide you with exceptional heart care, our providers are all part of one team.  This team includes your primary Cardiologist (physician) and Advanced Practice Providers or APPs (Physician Assistants and Nurse Practitioners) who all work together to provide you with the care you need, when you need it.  Your next appointment:   9 week(s) (approx 1 week after Echo)  Provider:   You may see Deatrice Cage, MD or [name] Bring, PA-C

## 2023-12-05 ENCOUNTER — Ambulatory Visit: Payer: Self-pay | Admitting: Nurse Practitioner

## 2023-12-05 ENCOUNTER — Other Ambulatory Visit
Admission: RE | Admit: 2023-12-05 | Discharge: 2023-12-05 | Disposition: A | Discharge status: Home / Self Care | Attending: Physician Assistant | Admitting: Physician Assistant

## 2023-12-05 DIAGNOSIS — E785 Hyperlipidemia, unspecified: Secondary | ICD-10-CM | POA: Diagnosis present

## 2023-12-05 DIAGNOSIS — Z79899 Other long term (current) drug therapy: Secondary | ICD-10-CM | POA: Insufficient documentation

## 2023-12-05 LAB — COMPREHENSIVE METABOLIC PANEL WITH GFR
ALT: 19 U/L (ref 0–44)
AST: 30 U/L (ref 15–41)
Albumin: 3.8 g/dL (ref 3.5–5.0)
Alkaline Phosphatase: 81 U/L (ref 38–126)
Anion gap: 9 (ref 5–15)
BUN: 33 mg/dL — ABNORMAL HIGH (ref 8–23)
CO2: 20 mmol/L — ABNORMAL LOW (ref 22–32)
Calcium: 9 mg/dL (ref 8.9–10.3)
Chloride: 109 mmol/L (ref 98–111)
Creatinine, Ser: 1.56 mg/dL — ABNORMAL HIGH (ref 0.44–1.00)
GFR, Estimated: 34 mL/min — ABNORMAL LOW (ref >60)
Glucose, Bld: 100 mg/dL — ABNORMAL HIGH (ref 70–99)
Potassium: 5.1 mmol/L (ref 3.5–5.1)
Sodium: 138 mmol/L (ref 135–145)
Total Bilirubin: 1.1 mg/dL (ref 0.0–1.2)
Total Protein: 7.8 g/dL (ref 6.5–8.1)

## 2023-12-05 LAB — LIPID PANEL
Cholesterol: 171 mg/dL (ref 0–200)
HDL: 43 mg/dL (ref >40)
LDL Cholesterol: 102 mg/dL — ABNORMAL HIGH (ref 0–99)
Total CHOL/HDL Ratio: 4 ratio
Triglycerides: 130 mg/dL (ref <150)
VLDL: 26 mg/dL (ref 0–40)

## 2023-12-12 ENCOUNTER — Other Ambulatory Visit: Payer: Self-pay | Admitting: Emergency Medicine

## 2023-12-12 MED ORDER — SACUBITRIL-VALSARTAN 24-26 MG PO TABS: 1.0000 | ORAL_TABLET | Freq: Two times a day (BID) | ORAL | 3 refills | Status: AC

## 2023-12-12 NOTE — Telephone Encounter (Signed)
See prior MyChart message.

## 2023-12-29 ENCOUNTER — Other Ambulatory Visit: Payer: Self-pay | Admitting: Physician Assistant

## 2024-01-16 ENCOUNTER — Ambulatory Visit: Attending: Physician Assistant

## 2024-01-16 DIAGNOSIS — I5020 Unspecified systolic (congestive) heart failure: Secondary | ICD-10-CM | POA: Insufficient documentation

## 2024-01-16 LAB — ECHOCARDIOGRAM LIMITED
AV Mean grad: 5 mmHg
AV Peak grad: 10.2 mmHg
Ao pk vel: 1.6 m/s
Area-P 1/2: 2.87 cm2
S' Lateral: 2.49 cm

## 2024-01-16 MED ORDER — PERFLUTREN LIPID MICROSPHERE
1.0000 mL | INTRAVENOUS | Status: AC | PRN
  Administered 2024-01-16: 3 mL via INTRAVENOUS

## 2024-01-17 ENCOUNTER — Ambulatory Visit: Payer: Self-pay | Admitting: Physician Assistant

## 2024-01-23 NOTE — Progress Notes (Unsigned)
 Cardiology Office Note    Date:  01/24/2024   ID:  Lane, Kjos 12-23-1945, MRN [id number]  PCP:  Derick Leita POUR, MD  Cardiologist:  Deatrice Cage, MD  Electrophysiologist:  None   Chief Complaint: Follow-up  History of Present Illness:   Lisa Hale is a 78 y.o. female with history of CAD with anterior ST elevation MI in 03/2022 status post PCI/DES to the LAD, HFrEF secondary to ICM, HTN, HLD, hypothyroidism, scoliosis, prior tobacco use quitting in 1999, and GERD who presents for follow-up of CAD and cardiomyopathy.   She was admitted to the hospital in 03/2022 with an anterior ST elevation MI.  Leading up to her admission, she reported substernal chest pain and tightness that radiated to the bilateral arms the day prior.  She was initially evaluated at an urgent care where EKG showed an anterior STEMI with subsequent code STEMI activation and transport to the ED via EMS.  Emergent LHC on 04/03/2022 showed an occluded mid LAD.  In addition, there was severe stenosis in the proximal RCA.  There was moderately reduced LV systolic function with an EF of 35% with akinesis of the mid to distal anterior wall, apex, and distal inferior wall.  Moderately elevated LVEDP of 27 mmHg.  She underwent successful PCI/DES to the mid LAD and successful OCT-guided PCI/DES to the RCA.  Echo during the admission demonstrated an EF of 25 to 30%, akinesis of the mid and distal anterior septum, apical lateral segment, mid inferoseptal segment, apical anterior segment, and apical inferior segment with hypokinesis of the mid inferolateral, mid anterolateral, mid anterior, and mid inferior segments.  There was also grade 1 diastolic dysfunction, mildly reduced RV systolic function with akinesis of the apex, moderately enlarged RV cavity size, mild mitral regurgitation, and aortic valve sclerosis with mild stenosis.  High-sensitivity troponin peaked at greater than 24,000.  LifeVest was placed  prior to discharge, however this had to be discontinued secondary to blisters noted at skin contact points.  She was seen in hospital follow-up on 05/02/2022 with baseline cognitive deficits were trending back to baseline.  She was adherent and tolerating cardiac medications.  She was transitioned from losartan  to Entresto  with continuation of carvedilol  and furosemide .  Follow-up labs showed persistent transaminitis with newly elevated ALT leading to the suspension of atorvastatin .  With this, LFTs did improve some leading to the resumption of atorvastatin .  Repeat LFTs were obtained on statin therapy which again showed elevations in AST/ALT leading to the recommendation to hold atorvastatin  and co-Q33moving forward.   She was seen in the office on 06/19/2022 and reported several episodes of dizziness and difficulty finding words that typically occurred in the evening hours when she was fatigued or looking into a bright light.  MRI of the brain was ordered, though ultimately deferred.  Zio patch showed a predominant rhythm of sinus with an average rate of 68 bpm (range 47 to 179 bpm), first-degree AV block, 13 episodes of SVT with the fastest interval lasting just 7 beats and the longest interval lasting 10.4 seconds.  There were occasional PVCs with an overall burden of 1.3%.  She was seen in the office in 07/2022 and was without further confusion episodes.  Subsequent echo in 07/2022 demonstrated improvement in LV systolic function with an EF of 35 to 40%, grade 1 diastolic dysfunction, akinesis of the mid apical anteroseptal wall and apical segment, low normal RV systolic function with normal ventricular cavity size, mild to moderate tricuspid  regurgitation, and mild aortic stenosis with a mean gradient of 9.5 mmHg.  Echo in 10/2023 showed a persistent cardiomyopathy with an EF of 30 to 35%, severe hypokinesis of the anterior, anteroseptal, septal, apical, and inferoapical regions, grade 1 diastolic dysfunction,  normal RV systolic function imaging with a cavity size, mildly elevated RVSP estimated at 41.7 mmHg, mild to moderate tricuspid regurgitation, aortic valve sclerosis without evidence of stenosis, and an estimated right atrial pressure of 3 mmHg.  Echo 01/16/2024 showed a persistent cardiomyopathy with an EF of 30 to 35%, akinesis of the left ventricular mid to apical anteroseptal wall, apical segment and anterior wall, grade 1 diastolic dysfunction, mildly reduced RV systolic function with normal ventricular cavity size, trivial mitral regurgitation, and aortic valve sclerosis without evidence of stenosis.  She comes in accompanied by her daughter today and is doing well from a cardiac perspective, without symptoms of angina or cardiac decompensation.  Dizziness is much improved following decrease of Entresto  with blood pressures at home largely in the low 1 teens to 120s mmHg systolic with a rare reading in the 90s mmHg systolic.  No falls or symptoms concerning for bleeding.  No lower extremity swelling or progressive orthopnea.  Weight is stable.  No palpitations.  She again reports a history of randomly occurring weakness in the left upper extremity without associated symptoms that is transient and self resolves.  Prefers to defer neuroimaging.   Labs independently reviewed: 11/2023 - TC 171, TG 130, HDL 43, LDL 102, potassium 5.1, BUN 33, serum creatinine 1.56, PMN 3.8, AST/ALT normal 06/2022 - Hgb 12.8, PLT 326 03/2022 - A1c 5.7, magnesium 2.0  Past Medical History:  Diagnosis Date   Asthma    CHF (congestive heart failure) (HCC)    Coronary artery disease    GERD (gastroesophageal reflux disease)    Glaucoma    Hypertension    Hypothyroidism    Scoliosis    Seasonal allergies    Skin cancer    Vertigo    Avg: 1x/mo    Past Surgical History:  Procedure Laterality Date   CATARACT EXTRACTION W/PHACO Right 03/13/2022   Procedure: CATARACT EXTRACTION PHACO AND INTRAOCULAR LENS PLACEMENT  (IOC) RIGHT WITH HYDRUS MICROSTENT  6.72  00:37.7;  Surgeon: Myrna Adine Anes, MD;  Location: [facility];  Service: Ophthalmology;  Laterality: Right;   CATARACT EXTRACTION W/PHACO Left 03/27/2022   Procedure: CATARACT EXTRACTION PHACO AND INTRAOCULAR LENS PLACEMENT (IOC) LEFT WITH IOL PLUS HYDRUS MICROSTENT 5.21 00:30.9;  Surgeon: Myrna Adine Anes, MD;  Location: [facility];  Service: Ophthalmology;  Laterality: Left;   COLONOSCOPY     COLONOSCOPY WITH PROPOFOL  N/A 03/12/2018   Procedure: COLONOSCOPY WITH PROPOFOL ;  Surgeon: Janalyn Keene NOVAK, MD;  Location: [facility];  Service: Endoscopy;  Laterality: N/A;   CORONARY/GRAFT ACUTE MI REVASCULARIZATION N/A 04/03/2022   Procedure: Coronary/Graft Acute MI Revascularization;  Surgeon: Darron Deatrice LABOR, MD;  Location: [facility];  Service: Cardiovascular;  Laterality: N/A;   LEFT HEART CATH AND CORONARY ANGIOGRAPHY N/A 04/03/2022   Procedure: LEFT HEART CATH AND CORONARY ANGIOGRAPHY;  Surgeon: Darron Deatrice LABOR, MD;  Location: [facility];  Service: Cardiovascular;  Laterality: N/A;   ORIF TIBIA & FIBULA FRACTURES     TONSILLECTOMY      Current Medications: Current Meds  Medication Sig   albuterol  (PROVENTIL  HFA;VENTOLIN  HFA) 108 (90 Base) MCG/ACT inhaler Inhale into the lungs every 6 (six) hours as needed for wheezing or shortness of breath.  aspirin  EC 81 MG tablet Take 1 tablet (81 mg total) by mouth daily. Swallow whole.   carvedilol  (COREG ) 3.125 MG tablet TAKE 1 TABLET(3.125 MG) BY MOUTH TWICE DAILY WITH A MEAL   cholecalciferol  25 MCG (1000 UT) TABS Take by mouth daily.   Evolocumab  (REPATHA  SURECLICK) 140 MG/ML SOAJ ADMINISTER 1 ML UNDER THE SKIN EVERY 14 DAYS   fluticasone (FLOVENT HFA) 220 MCG/ACT inhaler Inhale into the lungs 2 (two) times daily as needed.   furosemide  (LASIX ) 20 MG tablet Take 1 tablet (20 mg total) by mouth as needed (as needed for increased shortness of breath or  weight gain greater than 3 pounds overnight).   lansoprazole (PREVACID) 15 MG capsule Take 15 mg by mouth daily. Take around 8- 9am.   levothyroxine  (SYNTHROID ) 100 MCG tablet Take 100 mcg by mouth daily.   montelukast  (SINGULAIR ) 10 MG tablet Take 10 mg by mouth at bedtime.   nitroGLYCERIN  (NITROSTAT ) 0.4 MG SL tablet Take 1 tablet under the tongue every 5 minutes for up to 3 doses as directed   phenazopyridine (PYRIDIUM) 200 MG tablet Take 200 mg by mouth 3 (three) times daily as needed.   sacubitril -valsartan  (ENTRESTO ) 24-26 MG Take 1 tablet by mouth 2 (two) times daily.   spironolactone  (ALDACTONE ) 25 MG tablet TAKE 1/2 TABLET(12.5 MG) BY MOUTH DAILY   ticagrelor  (BRILINTA ) 90 MG TABS tablet Take 1 tablet (90 mg total) by mouth 2 (two) times daily.    Allergies:   Sunflower oil, Tetracycline, Bactrim [sulfamethoxazole-trimethoprim], and Pineapple   Social History   Socioeconomic History   Marital status: Widowed    Spouse name: Not on file   Number of children: Not on file   Years of education: Not on file   Highest education level: Not on file  Occupational History   Not on file  Tobacco Use   Smoking status: Former    Current packs/day: 0.00    Average packs/day: 0.3 packs/day for 15.0 years (3.8 ttl pk-yrs)    Types: Cigarettes    Start date: 10/30/2006    Quit date: 10/29/2021    Years since quitting: 2.2   Smokeless tobacco: Never   Tobacco comments:    She has been on and off smoking since she was 78 years old.  Vaping Use   Vaping status: Never Used  Substance and Sexual Activity   Alcohol use: Yes    Comment: couple x/yr   Drug use: Never   Sexual activity: Not on file  Other Topics Concern   Not on file  Social History Narrative   Not on file   Social Drivers of Health   Financial Resource Strain: Not on file  Food Insecurity: No Food Insecurity (04/03/2022)   Hunger Vital Sign    Worried About Running Out of Food in the Last Year: Never true    Ran Out of  Food in the Last Year: Never true  Transportation Needs: No Transportation Needs (04/03/2022)   PRAPARE - Administrator, Civil Service (Medical): No    Lack of Transportation (Non-Medical): No  Physical Activity: Not on file  Stress: Not on file  Social Connections: Not on file     Family History:  The patient's family history includes Colon polyps in her brother.  ROS:   12-point review of systems is negative unless otherwise noted in the HPI.   EKGs/Labs/Other Studies Reviewed:    Studies reviewed were summarized above. The additional studies were reviewed today:  LHC  04/03/2022:   2nd Diag lesion is 60% stenosed.   Mid LAD to Dist LAD lesion is 100% stenosed.   Mid LAD lesion is 40% stenosed.   Dist LAD lesion is 60% stenosed.   Prox RCA lesion is 60% stenosed.   Prox RCA to Mid RCA lesion is 95% stenosed.   A drug-eluting stent was successfully placed using a STENT ONYX FRONTIER 2.25X38.   A drug-eluting stent was successfully placed using a STENT ONYX FRONTIER 3.0X38.   Post intervention, there is a 0% residual stenosis.   Post intervention, there is a 0% residual stenosis.   Post intervention, there is a 0% residual stenosis.   There is moderate left ventricular systolic dysfunction.   LV end diastolic pressure is moderately elevated.   1.  Anterior ST elevation myocardial infarction due to an occluded mid LAD.  In addition, there is severe stenosis in the proximal right coronary artery. 2.  Moderately reduced LV systolic function with an EF of 35% with akinesis of the mid to distal anterior wall, apex and distal inferior wall.  Moderately elevated left ventricular end-diastolic pressure with an LVEDP of 27 mmHg. 3.  Successful angioplasty and drug-eluting stent placement to the mid LAD. 4.  Successful OCT guided PCI and drug-eluting stent placement to the right coronary artery.   Recommendations: Dual antiplatelet therapy for at least 12 months. Aggressive  treatment of risk factors. I started small dose carvedilol . __________   2D echo 04/04/2022: 1. Left ventricular ejection fraction, by estimation, is 25 to 30%. The  left ventricle has severely decreased function. The left ventricle  demonstrates regional wall motion abnormalities (see scoring  diagram/findings for description). Left ventricular  diastolic parameters are consistent with Grade I diastolic dysfunction  (impaired relaxation).   2. Right ventricular systolic function is mildly reduced with akinesis of  the apex. The right ventricular size is moderately enlarged.   3. The mitral valve is normal in structure. Mild mitral valve  regurgitation.   4. The aortic valve has an indeterminant number of cusps. There is mild  calcification of the aortic valve. There is moderate thickening of the  aortic valve. Aortic valve regurgitation is not visualized. Mild aortic  valve stenosis. Aortic valve area, by  VTI measures 1.29 cm. Aortic valve mean gradient measures 5.0 mmHg.  __________   Zio patch 06/2022 Patient had a min HR of 47 bpm, max HR of 179 bpm, and avg HR of 68 bpm. Predominant underlying rhythm was Sinus Rhythm. First Degree AV Block was present.  13 Supraventricular Tachycardia runs occurred, the run with the fastest interval lasting 7 beats with a max rate of 179 bpm, the longest lasting 10.4 secs with an avg rate of 138 bpm.  Occasional PVCs with a burden of 1.3%. __________   2D echo 08/16/2022: 1. Left ventricular ejection fraction, by estimation, is 35 to 40%. The  left ventricle has moderately decreased function. The left ventricle  demonstrates regional wall motion abnormalities (see scoring  diagram/findings for description). Left ventricular   diastolic parameters are consistent with Grade I diastolic dysfunction  (impaired relaxation). There is akinesis of the left ventricular,  mid-apical anteroseptal wall and apical segment.   2. Right ventricular systolic  function is low normal. The right  ventricular size is normal.   3. The mitral valve is normal in structure. No evidence of mitral valve  regurgitation.   4. Tricuspid valve regurgitation is mild to moderate.   5. The aortic valve is calcified.  Aortic valve regurgitation is not  visualized. Mild aortic valve stenosis. Aortic valve area, by VTI measures  1.68 cm. Aortic valve mean gradient measures 9.5 mmHg.   Comparison(s): Previous LVEF reported as 25-30%. __________   2D echo 11/07/2023: 1. Left ventricular ejection fraction, by estimation, is 30 to 35%. The  left ventricle has moderately decreased function. The left ventricle  demonstrates regional wall motion abnormalities (Severe hypokinesis of the  anterior, anteroseptal/septal, apical   and inferoapical regions). Left ventricular diastolic parameters are  consistent with Grade I diastolic dysfunction (impaired relaxation).   2. Right ventricular systolic function is normal. The right ventricular  size is normal. There is mildly elevated pulmonary artery systolic  pressure. The estimated right ventricular systolic pressure is 41.7 mmHg.   3. The mitral valve is normal in structure. No evidence of mitral valve  regurgitation. No evidence of mitral stenosis.   4. Tricuspid valve regurgitation is mild to moderate.   5. The aortic valve is normal in structure. Aortic valve regurgitation is  not visualized. Aortic valve sclerosis is present, with no evidence of  aortic valve stenosis.   6. The inferior vena cava is normal in size with greater than 50%  respiratory variability, suggesting right atrial pressure of 3 mmHg. __________  Limited echo 01/16/2024: 1. Left ventricular ejection fraction, by estimation, is 30 to 35%. The  left ventricle has moderate to severely decreased function. The left  ventricle demonstrates regional wall motion abnormalities (see scoring  diagram/findings for description). Left  ventricular diastolic  parameters are consistent with Grade I diastolic  dysfunction (impaired relaxation). There is akinesis of the left  ventricular, mid-apical anteroseptal wall, apical segment and anterior  wall.   2. Right ventricular systolic function is mildly reduced. The right  ventricular size is normal.   3. The mitral valve is normal in structure. Trivial mitral valve  regurgitation.   4. The aortic valve is tricuspid. Aortic valve regurgitation is not  visualized. Aortic valve sclerosis is present, with no evidence of aortic  valve stenosis.    EKG:  EKG is ordered today.  The EKG ordered today demonstrates NSR, 65 bpm, poor R wave progression along the precordial leads, incomplete RBBB, prior anterior and inferior infarct, consistent with prior tracing   Recent Labs: 12/05/2023: ALT 19; BUN 33; Creatinine, Ser 1.56; Potassium 5.1; Sodium 138  Recent Lipid Panel    Component Value Date/Time   CHOL 171 12/05/2023 1135   TRIG 130 12/05/2023 1135   HDL 43 12/05/2023 1135   CHOLHDL 4.0 12/05/2023 1135   VLDL 26 12/05/2023 1135   LDLCALC 102 (H) 12/05/2023 1135    PHYSICAL EXAM:    VS:  BP 136/72 (BP Location: Left Arm, Patient Position: Sitting, Cuff Size: Normal)   Pulse 65   Wt 158 lb 9.6 oz (71.9 kg)   SpO2 95%   BMI 28.09 kg/m   BMI: Body mass index is 28.09 kg/m.  Physical Exam Constitutional:      Appearance: She is well-developed.  HENT:     Head: Normocephalic and atraumatic.  Eyes:     General:        Right eye: No discharge.        Left eye: No discharge.  Cardiovascular:     Rate and Rhythm: Normal rate and regular rhythm.     Heart sounds: S1 normal and S2 normal. Heart sounds not distant. No midsystolic click and no opening snap. Murmur heard.     Systolic murmur  is present with a grade of 1/6 at the upper right sternal border.     No friction rub.  Pulmonary:     Effort: Pulmonary effort is normal. No respiratory distress.     Breath sounds: Normal breath sounds.  No decreased breath sounds, wheezing, rhonchi or rales.  Musculoskeletal:     Cervical back: Normal range of motion.  Skin:    General: Skin is warm and dry.     Nails: There is no clubbing.  Neurological:     Mental Status: She is alert and oriented to person, place, and time.  Psychiatric:        Speech: Speech normal.        Behavior: Behavior normal.        Thought Content: Thought content normal.        Judgment: Judgment normal.     Wt Readings from Last 3 Encounters:  01/24/24 158 lb 9.6 oz (71.9 kg)  11/15/23 160 lb (72.6 kg)  06/04/23 165 lb 3.2 oz (74.9 kg)     ASSESSMENT & PLAN:   CAD with anterior ST elevation MI without angina: She continues to do well and is without symptoms of angina or cardiac decompensation.  She remains on DAPT with aspirin  81 mg anticoagulant 90 mg twice daily given extensive stenting into both the LAD and RCA at the time of her MI.  In follow-up, consider reduction of ticagrelor  to 60 mg twice daily versus mono platelet therapy, will need to discuss with interventional cardiology.  Continue aggressive risk factor modification and secondary prevention including carvedilol  3.125 mg twice daily (has not tolerated titrated doses secondary to bradycardia) and Repatha  140 mg injected every 14 days.  She has completed cardiac rehab.  HFrEF secondary to ICM: Euvolemic and well compensated with NYHA class I-II symptoms.  Despite optimization of GDMT her cardiomyopathy persists with an EF of 30 to 35% with akinesis involving the anteroseptal wall.  She has had increased risk for ventricular arrhythmias with persistent cardiomyopathy and akinesis involving the anteroseptal wall.  We had a long discussion today regarding further management of this up to and including ICD implantation.  She would like to think on this.  Remains on carvedilol  3.125 mg twice daily, Entresto  24/26 mg twice daily, and spironolactone  12.5 mg daily.  Has not tolerated the titration of  beta-blocker or ARNI secondary to dizziness.  Has been hesitant to consider addition of SGLT2 therapy.  Nonetheless, based on echo I do not think further escalation of pharmacotherapy will improve her cardiomyopathy.  Aortic stenosis: Echo earlier this month showed aortic valve sclerosis without evidence of stenosis.  Continue to monitor.  HTN: Blood pressure is well-controlled in the office today.  Continue pharmacotherapy as outlined above.  HLD: LDL 102 in 11/2023.  Target LDL less than 70.  On Repatha .  Elevated LFTs: Resolved off statin therapy.  Left arm weakness: Intermittent, randomly occurring spontaneously resolving lasting several minutes in duration.  We again discussed neuroimaging, though she would like to defer this at this time.  Place Zio patch.    Disposition: F/u with Dr. Darron or an APP in 6 months, sooner based on testing.   Medication Adjustments/Labs and Tests Ordered: Current medicines are reviewed at length with the patient today.  Concerns regarding medicines are outlined above. Medication changes, Labs and Tests ordered today are summarized above and listed in the Patient Instructions accessible in Encounters.   SignedBernardino Bring, PA-C 01/24/2024 3:54 PM  [facility] [address] [place], [place] [address] [phone]

## 2024-01-24 ENCOUNTER — Ambulatory Visit

## 2024-01-24 ENCOUNTER — Encounter: Payer: Self-pay | Admitting: Physician Assistant

## 2024-01-24 ENCOUNTER — Ambulatory Visit: Attending: Physician Assistant | Admitting: Physician Assistant

## 2024-01-24 VITALS — BP 136/72 | HR 65 | Wt 158.6 lb

## 2024-01-24 DIAGNOSIS — I350 Nonrheumatic aortic (valve) stenosis: Secondary | ICD-10-CM | POA: Diagnosis present

## 2024-01-24 DIAGNOSIS — R7989 Other specified abnormal findings of blood chemistry: Secondary | ICD-10-CM | POA: Diagnosis present

## 2024-01-24 DIAGNOSIS — I5020 Unspecified systolic (congestive) heart failure: Secondary | ICD-10-CM | POA: Diagnosis not present

## 2024-01-24 DIAGNOSIS — E785 Hyperlipidemia, unspecified: Secondary | ICD-10-CM | POA: Diagnosis present

## 2024-01-24 DIAGNOSIS — I2510 Atherosclerotic heart disease of native coronary artery without angina pectoris: Secondary | ICD-10-CM | POA: Diagnosis not present

## 2024-01-24 DIAGNOSIS — I255 Ischemic cardiomyopathy: Secondary | ICD-10-CM

## 2024-01-24 DIAGNOSIS — I10 Essential (primary) hypertension: Secondary | ICD-10-CM | POA: Diagnosis present

## 2024-01-24 DIAGNOSIS — R29898 Other symptoms and signs involving the musculoskeletal system: Secondary | ICD-10-CM | POA: Insufficient documentation

## 2024-01-24 DIAGNOSIS — R42 Dizziness and giddiness: Secondary | ICD-10-CM

## 2024-01-24 NOTE — Patient Instructions (Signed)
 Medication Instructions:  Your physician recommends that you continue on your current medications as directed. Please refer to the Current Medication list given to you today.   *If you need a refill on your cardiac medications before your next appointment, please call your pharmacy*  Lab Work: None ordered at this time   Testing/Procedures: ZIO XT- Long Term Monitor Instructions  Your physician has requested you wear a ZIO patch monitor for 14 days.  This is a single patch monitor. [employer] supplies one patch monitor per enrollment. Additional stickers are not available. Please do not apply patch if you will be having a Nuclear Stress Test, Echocardiogram, Cardiac CT, MRI, or Chest Xray during the period you would be wearing the monitor. The patch cannot be worn during these tests. You cannot remove and re-apply the ZIO XT patch monitor.  Your ZIO patch monitor will be mailed 3 day USPS to your address on file. It may take 3-5 days to receive your monitor after you have been enrolled. Once you have received your monitor, please review the enclosed instructions. Your monitor has already been registered assigning a specific monitor serial number to you.  Billing and Patient Assistance Program Information  We have supplied [employer] with any of your insurance information on file for billing purposes.  [employer] offers a sliding scale Patient Assistance Program for patients that do not have insurance, or whose insurance does not completely cover the cost of the ZIO monitor.  You must apply for the Patient Assistance Program to qualify for this discounted rate.  To apply, please call [employer] at [phone], select option 4, select option 2, ask to apply for Patient Assistance Program. Meredeth will ask your household income, and how many people are in your household. They will quote your out-of-pocket cost based on that information. [employer] will also be able to set up a 98-month, interest-free payment plan if  needed.  Applying the monitor   Shave hair from upper left chest.  Hold abrader disc by orange tab. Rub abrader in 40 strokes over the upper left chest as indicated in your monitor instructions.  Clean area with 4 enclosed alcohol pads. Let dry.  Apply patch as indicated in monitor instructions. Patch will be placed under collarbone on left side of chest with arrow pointing upward.  Rub patch adhesive wings for 2 minutes. Remove white label marked 1. Remove the white label marked 2. Rub patch adhesive wings for 2 additional minutes.  While looking in a mirror, press and release button in center of patch. A small green light will flash 3-4 times. This will be your only indicator that the monitor has been turned on.  Do not shower for the first 24 hours. You may shower after the first 24 hours.  Press the button if you feel a symptom. You will hear a small click. Record Date, Time and Symptom in the Patient Logbook.  When you are ready to remove the patch, follow instructions on the last 2 pages of Patient Logbook.  Stick patch monitor into the tabs at the bottom of the return box.  Place Patient Logbook in the blue and white box. Use locking tab on box and tape box closed securely. The blue and white box has prepaid postage on it. Please place it in the mailbox as soon as possible. Your physician should have your test results approximately 7-14 days after the monitor has been mailed back to [facility].  Call [facility] Customer Care at [phone] if you have  questions regarding your ZIO XT patch monitor.  Call them immediately if you see an orange light blinking on your monitor.  If your monitor falls off in less than 4 days, contact our Monitor department at [phone].  If your monitor becomes loose or falls off after 4 days call [employer] at [phone] for suggestions on securing your monitor.   Follow-Up: At [facility], you and your health needs are our  priority.  As part of our continuing mission to provide you with exceptional heart care, our providers are all part of one team.  This team includes your primary Cardiologist (physician) and Advanced Practice Providers or APPs (Physician Assistants and Nurse Practitioners) who all work together to provide you with the care you need, when you need it.  Your next appointment:   6 month(s)  Provider:   You may see Deatrice Cage, MD or [name] Bring, PA-C

## 2024-01-25 ENCOUNTER — Other Ambulatory Visit: Payer: Self-pay | Admitting: Emergency Medicine

## 2024-01-25 DIAGNOSIS — R202 Paresthesia of skin: Secondary | ICD-10-CM

## 2024-01-25 DIAGNOSIS — R42 Dizziness and giddiness: Secondary | ICD-10-CM

## 2024-01-25 DIAGNOSIS — I255 Ischemic cardiomyopathy: Secondary | ICD-10-CM

## 2024-01-25 NOTE — Telephone Encounter (Signed)
 Lisa Hale,  Please cancel ZIO XT.  Schedule patient to be evaluated by EP MD for ICD.

## 2024-01-28 NOTE — Addendum Note (Signed)
 Addended by: CRISTOPHER OLIVIA PARAS on: 01/28/2024 04:26 PM   Modules accepted: Orders

## 2024-01-29 NOTE — Progress Notes (Unsigned)
  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  The patient has a persistently reduced left ventricular function, EF 30%.  At the last appointment with [name], ICD therapy was discussed.      Their past medical, social and family history was reviewed.   ROS:   Please see the history of present illness.    All other systems reviewed and are negative.  EKGs/Labs/Other Studies Reviewed:    The following studies were reviewed today:  January 16, 2024 echo EF 30%  January 24, 2024 EKG shows sinus rhythm.  Narrow QRS.      Physical Exam:    VS:  There were no vitals taken for this visit.    Wt Readings from Last 3 Encounters:  01/24/24 158 lb 9.6 oz (71.9 kg)  11/15/23 160 lb (72.6 kg)  06/04/23 165 lb 3.2 oz (74.9 kg)     GEN: no distress CARD: RRR, No MRG RESP: No IWOB. CTAB.        ASSESSMENT AND PLAN:    No diagnosis found.  #Chronic systolic heart failure NYHA class II-III.  Warm and dry on exam.  On good GDMT.  Cardiomyopathy secondary to ischemic cardiomyopathy and severe coronary artery disease.  We discussed her risk of sudden cardiac death during today's clinic appointment.  We discussed the role of ICD therapy and reducing the risk of sudden cardiac death.  The patient has an ischemic CM (EF 30%), NYHA Class III CHF, and CAD.  He is referred by [name] Hale for risk stratification of sudden death and consideration of ICD implantation.  At this time, she meets criteria for ICD implantation for  primary prevention of sudden death.  I have had a thorough discussion with the patient reviewing options.  The patient and their family (if available) have had opportunities to ask questions and have them answered. The patient and I have decided together through a shared decision making process to proceed with ICD implant at this time.    Risks, benefits, alternatives to ICD implantation were discussed in detail with the patient today. The patient understands that the risks include but are not limited to bleeding, infection, pneumothorax, perforation, tamponade, vascular damage, renal failure, MI, stroke, death, inappropriate shocks, and lead dislodgement and wishes to proceed.  We will therefore schedule device implantation at the next available time.  Hold ticagrelor  for 5 days prior to the implant.  Continue aspirin  uninterrupted.  Plan for [facility] [employer] single-chamber ICD.     Signed, OLE ONEIDA. HOLTS, MD, [facility], [facility] 01/29/2024 8:35 PM    Electrophysiology [facility]

## 2024-01-30 ENCOUNTER — Encounter: Payer: Self-pay | Admitting: Cardiology

## 2024-01-30 ENCOUNTER — Ambulatory Visit: Attending: Cardiology | Admitting: Cardiology

## 2024-01-30 VITALS — BP 100/60 | HR 78 | Ht 63.0 in | Wt 155.0 lb

## 2024-01-30 DIAGNOSIS — I2510 Atherosclerotic heart disease of native coronary artery without angina pectoris: Secondary | ICD-10-CM | POA: Insufficient documentation

## 2024-01-30 DIAGNOSIS — I5020 Unspecified systolic (congestive) heart failure: Secondary | ICD-10-CM | POA: Insufficient documentation

## 2024-01-30 DIAGNOSIS — I255 Ischemic cardiomyopathy: Secondary | ICD-10-CM | POA: Diagnosis not present

## 2024-01-30 NOTE — Patient Instructions (Signed)
 Medication Instructions:  Your physician recommends that you continue on your current medications as directed. Please refer to the Current Medication list given to you today.  *If you need a refill on your cardiac medications before your next appointment, please call your pharmacy*  Testing/Procedures: ICD Implant Your physician has recommended that you have a defibrillator inserted. An implantable cardioverter defibrillator (ICD) is a small device that is placed in your chest or, in rare cases, your abdomen. This device uses electrical pulses or shocks to help control life-threatening, irregular heartbeats that could lead the heart to suddenly stop beating (sudden cardiac arrest). Leads are attached to the ICD that goes into your heart. This is done in the hospital and usually requires an overnight stay.  Please call us  when you are ready to schedule.

## 2024-02-15 ENCOUNTER — Ambulatory Visit
Admission: RE | Admit: 2024-02-15 | Discharge: 2024-02-15 | Disposition: A | Discharge status: Home / Self Care | Source: Ambulatory Visit | Attending: Physician Assistant | Admitting: Physician Assistant

## 2024-02-15 DIAGNOSIS — R42 Dizziness and giddiness: Secondary | ICD-10-CM | POA: Diagnosis present

## 2024-02-15 DIAGNOSIS — R202 Paresthesia of skin: Secondary | ICD-10-CM | POA: Insufficient documentation

## 2024-02-15 MED ORDER — GADOBUTROL 1 MMOL/ML IV SOLN
7.5000 mL | Freq: Once | INTRAVENOUS | Status: AC | PRN
  Administered 2024-02-15: 7.5 mL via INTRAVENOUS

## 2024-02-19 ENCOUNTER — Ambulatory Visit: Payer: Self-pay | Admitting: Physician Assistant

## 2024-07-23 ENCOUNTER — Ambulatory Visit: Admitting: Physician Assistant
